# Patient Record
Sex: Female | Born: 1945
Health system: Southern US, Community
[De-identification: ages and names within clinical notes are randomized; demographics above are authoritative.]

## PROBLEM LIST (undated history)

## (undated) ENCOUNTER — Ambulatory Visit: Admission: EM

## (undated) DIAGNOSIS — B373 Candidiasis of vulva and vagina: Secondary | ICD-10-CM

## (undated) DIAGNOSIS — E538 Deficiency of other specified B group vitamins: Secondary | ICD-10-CM

## (undated) DIAGNOSIS — E0500 Thyrotoxicosis with diffuse goiter without thyrotoxic crisis or storm: Secondary | ICD-10-CM

## (undated) DIAGNOSIS — M549 Dorsalgia, unspecified: Secondary | ICD-10-CM

## (undated) DIAGNOSIS — E559 Vitamin D deficiency, unspecified: Secondary | ICD-10-CM

## (undated) DIAGNOSIS — R1013 Epigastric pain: Secondary | ICD-10-CM

## (undated) DIAGNOSIS — T7840XA Allergy, unspecified, initial encounter: Secondary | ICD-10-CM

## (undated) DIAGNOSIS — M8580 Other specified disorders of bone density and structure, unspecified site: Secondary | ICD-10-CM

## (undated) DIAGNOSIS — K219 Gastro-esophageal reflux disease without esophagitis: Secondary | ICD-10-CM

## (undated) DIAGNOSIS — B3731 Acute candidiasis of vulva and vagina: Secondary | ICD-10-CM

## (undated) DIAGNOSIS — K760 Fatty (change of) liver, not elsewhere classified: Secondary | ICD-10-CM

## (undated) DIAGNOSIS — K635 Polyp of colon: Secondary | ICD-10-CM

## (undated) DIAGNOSIS — E039 Hypothyroidism, unspecified: Secondary | ICD-10-CM

## (undated) DIAGNOSIS — E785 Hyperlipidemia, unspecified: Secondary | ICD-10-CM

## (undated) DIAGNOSIS — E0590 Thyrotoxicosis, unspecified without thyrotoxic crisis or storm: Secondary | ICD-10-CM

## (undated) HISTORY — DX: Thyrotoxicosis, unspecified without thyrotoxic crisis or storm: E05.90

## (undated) HISTORY — DX: Candidiasis of vulva and vagina: B37.3

## (undated) HISTORY — PX: COLONOSCOPY: SHX5424

## (undated) HISTORY — PX: CATARACT EXTRACTION, BILATERAL: SHX1313

## (undated) HISTORY — DX: Acute candidiasis of vulva and vagina: B37.31

## (undated) HISTORY — DX: Hyperlipidemia, unspecified: E78.5

## (undated) HISTORY — PX: OTHER SURGICAL HISTORY: SHX169

## (undated) HISTORY — DX: Allergy, unspecified, initial encounter: T78.40XA

## (undated) HISTORY — DX: Other specified disorders of bone density and structure, unspecified site: M85.80

## (undated) HISTORY — DX: Dorsalgia, unspecified: M54.9

## (undated) HISTORY — PX: EYE SURGERY: SHX253

## (undated) HISTORY — PX: REDUCTION MAMMAPLASTY: SUR839

## (undated) HISTORY — PX: BREAST SURGERY: SHX581

## (undated) HISTORY — PX: CARPAL TUNNEL RELEASE: SHX101

---

## 1981-11-23 HISTORY — PX: REDUCTION MAMMAPLASTY: SUR839

## 1998-07-19 ENCOUNTER — Other Ambulatory Visit: Admission: RE | Admit: 1998-07-19 | Discharge: 1998-07-19 | Payer: Self-pay | Admitting: Obstetrics and Gynecology

## 1999-11-10 ENCOUNTER — Other Ambulatory Visit: Admission: RE | Admit: 1999-11-10 | Discharge: 1999-11-10 | Payer: Self-pay | Admitting: Obstetrics and Gynecology

## 2000-09-09 ENCOUNTER — Encounter: Admission: RE | Admit: 2000-09-09 | Discharge: 2000-09-09 | Payer: Self-pay | Admitting: Obstetrics and Gynecology

## 2000-09-09 ENCOUNTER — Encounter: Payer: Self-pay | Admitting: Obstetrics and Gynecology

## 2000-11-30 ENCOUNTER — Other Ambulatory Visit: Admission: RE | Admit: 2000-11-30 | Discharge: 2000-11-30 | Payer: Self-pay | Admitting: Obstetrics and Gynecology

## 2001-12-02 ENCOUNTER — Other Ambulatory Visit: Admission: RE | Admit: 2001-12-02 | Discharge: 2001-12-02 | Payer: Self-pay | Admitting: Obstetrics and Gynecology

## 2003-04-11 ENCOUNTER — Encounter: Payer: Self-pay | Admitting: Obstetrics and Gynecology

## 2003-04-11 ENCOUNTER — Encounter: Admission: RE | Admit: 2003-04-11 | Discharge: 2003-04-11 | Payer: Self-pay | Admitting: Obstetrics and Gynecology

## 2005-07-02 ENCOUNTER — Encounter: Admission: RE | Admit: 2005-07-02 | Discharge: 2005-07-02 | Payer: Self-pay | Admitting: Obstetrics and Gynecology

## 2005-12-02 ENCOUNTER — Encounter
Admission: RE | Admit: 2005-12-02 | Discharge: 2005-12-02 | Payer: Self-pay | Admitting: Physical Medicine and Rehabilitation

## 2006-10-22 ENCOUNTER — Ambulatory Visit: Payer: Self-pay | Admitting: Family Medicine

## 2006-10-26 ENCOUNTER — Ambulatory Visit: Payer: Self-pay | Admitting: Family Medicine

## 2007-08-24 LAB — HM DEXA SCAN

## 2007-09-12 ENCOUNTER — Encounter: Payer: Self-pay | Admitting: Family Medicine

## 2007-09-12 ENCOUNTER — Ambulatory Visit: Payer: Self-pay | Admitting: Obstetrics and Gynecology

## 2007-11-03 ENCOUNTER — Ambulatory Visit: Payer: Self-pay | Admitting: Obstetrics and Gynecology

## 2008-07-23 ENCOUNTER — Ambulatory Visit: Payer: Self-pay | Admitting: Family Medicine

## 2008-07-23 DIAGNOSIS — M810 Age-related osteoporosis without current pathological fracture: Secondary | ICD-10-CM | POA: Insufficient documentation

## 2008-07-23 DIAGNOSIS — M8580 Other specified disorders of bone density and structure, unspecified site: Secondary | ICD-10-CM

## 2008-07-24 ENCOUNTER — Telehealth (INDEPENDENT_AMBULATORY_CARE_PROVIDER_SITE_OTHER): Payer: Self-pay | Admitting: *Deleted

## 2008-07-26 ENCOUNTER — Ambulatory Visit: Payer: Self-pay | Admitting: Family Medicine

## 2008-07-26 DIAGNOSIS — E039 Hypothyroidism, unspecified: Secondary | ICD-10-CM | POA: Insufficient documentation

## 2008-08-01 LAB — CONVERTED CEMR LAB
Free T4: 2.5 ng/dL — ABNORMAL HIGH (ref 0.6–1.6)
T3 Uptake Ratio: 49.1 % — ABNORMAL HIGH (ref 22.5–37.0)
TSH: 0.03 microintl units/mL — ABNORMAL LOW (ref 0.35–5.50)

## 2008-08-09 ENCOUNTER — Encounter: Payer: Self-pay | Admitting: Family Medicine

## 2008-08-11 ENCOUNTER — Encounter: Payer: Self-pay | Admitting: Family Medicine

## 2008-08-13 ENCOUNTER — Encounter: Payer: Self-pay | Admitting: Family Medicine

## 2008-08-16 ENCOUNTER — Encounter: Payer: Self-pay | Admitting: Family Medicine

## 2008-08-23 DIAGNOSIS — E559 Vitamin D deficiency, unspecified: Secondary | ICD-10-CM

## 2008-09-13 ENCOUNTER — Encounter: Payer: Self-pay | Admitting: Family Medicine

## 2008-09-27 ENCOUNTER — Encounter: Payer: Self-pay | Admitting: Family Medicine

## 2008-10-26 ENCOUNTER — Telehealth: Payer: Self-pay | Admitting: Family Medicine

## 2008-10-30 ENCOUNTER — Telehealth: Payer: Self-pay | Admitting: Family Medicine

## 2008-10-31 ENCOUNTER — Encounter: Payer: Self-pay | Admitting: Family Medicine

## 2008-11-08 ENCOUNTER — Encounter: Payer: Self-pay | Admitting: Family Medicine

## 2008-12-14 ENCOUNTER — Ambulatory Visit: Payer: Self-pay | Admitting: Family Medicine

## 2008-12-14 DIAGNOSIS — J309 Allergic rhinitis, unspecified: Secondary | ICD-10-CM | POA: Insufficient documentation

## 2008-12-14 LAB — CONVERTED CEMR LAB: Rapid Strep: NEGATIVE

## 2009-02-25 ENCOUNTER — Telehealth: Payer: Self-pay | Admitting: Family Medicine

## 2009-03-18 ENCOUNTER — Telehealth: Payer: Self-pay | Admitting: Family Medicine

## 2009-04-25 ENCOUNTER — Encounter: Payer: Self-pay | Admitting: Family Medicine

## 2009-07-09 ENCOUNTER — Telehealth: Payer: Self-pay | Admitting: Family Medicine

## 2009-07-25 ENCOUNTER — Encounter: Payer: Self-pay | Admitting: Family Medicine

## 2009-07-25 ENCOUNTER — Ambulatory Visit: Payer: Self-pay | Admitting: Family Medicine

## 2009-08-01 ENCOUNTER — Encounter (INDEPENDENT_AMBULATORY_CARE_PROVIDER_SITE_OTHER): Payer: Self-pay | Admitting: *Deleted

## 2009-09-18 ENCOUNTER — Ambulatory Visit: Payer: Self-pay | Admitting: Family Medicine

## 2009-09-18 ENCOUNTER — Encounter: Payer: Self-pay | Admitting: Family Medicine

## 2009-10-24 ENCOUNTER — Encounter: Payer: Self-pay | Admitting: Family Medicine

## 2009-12-10 ENCOUNTER — Telehealth: Payer: Self-pay | Admitting: Family Medicine

## 2010-03-27 ENCOUNTER — Encounter: Payer: Self-pay | Admitting: Family Medicine

## 2010-09-25 ENCOUNTER — Encounter: Payer: Self-pay | Admitting: Family Medicine

## 2010-10-07 ENCOUNTER — Telehealth: Payer: Self-pay | Admitting: Family Medicine

## 2010-11-10 ENCOUNTER — Encounter: Payer: Self-pay | Admitting: Family Medicine

## 2010-11-10 ENCOUNTER — Ambulatory Visit: Payer: Self-pay | Admitting: Family Medicine

## 2010-11-18 ENCOUNTER — Encounter: Payer: Self-pay | Admitting: Family Medicine

## 2010-11-18 ENCOUNTER — Encounter (INDEPENDENT_AMBULATORY_CARE_PROVIDER_SITE_OTHER): Payer: Self-pay | Admitting: *Deleted

## 2010-11-25 ENCOUNTER — Encounter: Payer: Self-pay | Admitting: Family Medicine

## 2010-11-25 ENCOUNTER — Telehealth (INDEPENDENT_AMBULATORY_CARE_PROVIDER_SITE_OTHER): Payer: Self-pay | Admitting: *Deleted

## 2010-11-25 ENCOUNTER — Ambulatory Visit
Admission: RE | Admit: 2010-11-25 | Discharge: 2010-11-25 | Payer: Self-pay | Source: Home / Self Care | Attending: Family Medicine | Admitting: Family Medicine

## 2010-11-27 ENCOUNTER — Other Ambulatory Visit
Admission: RE | Admit: 2010-11-27 | Discharge: 2010-11-27 | Payer: Self-pay | Source: Home / Self Care | Admitting: Family Medicine

## 2010-11-27 ENCOUNTER — Ambulatory Visit
Admission: RE | Admit: 2010-11-27 | Discharge: 2010-11-27 | Payer: Self-pay | Source: Home / Self Care | Attending: Family Medicine | Admitting: Family Medicine

## 2010-11-27 LAB — CONVERTED CEMR LAB
Albumin: 3.8 g/dL (ref 3.5–5.2)
Alkaline Phosphatase: 96 units/L (ref 39–117)
Basophils Absolute: 0 10*3/uL (ref 0.0–0.1)
CO2: 29 meq/L (ref 19–32)
Calcium: 9.3 mg/dL (ref 8.4–10.5)
Cholesterol: 199 mg/dL (ref 0–200)
Eosinophils Absolute: 0.1 10*3/uL (ref 0.0–0.7)
Glucose, Bld: 80 mg/dL (ref 70–99)
HDL: 57.9 mg/dL (ref >39.00)
Hemoglobin: 12.5 g/dL (ref 12.0–15.0)
Lymphocytes Relative: 24.4 % (ref 12.0–46.0)
Lymphs Abs: 0.9 10*3/uL (ref 0.7–4.0)
MCHC: 34.2 g/dL (ref 30.0–36.0)
MCV: 96.5 fL (ref 78.0–100.0)
Monocytes Absolute: 0.3 10*3/uL (ref 0.1–1.0)
Neutro Abs: 2.2 10*3/uL (ref 1.4–7.7)
RDW: 12.6 % (ref 11.5–14.6)
Sodium: 139 meq/L (ref 135–145)
TSH: 0.26 microintl units/mL — ABNORMAL LOW (ref 0.35–5.50)
Triglycerides: 60 mg/dL (ref 0.0–149.0)

## 2010-12-01 ENCOUNTER — Telehealth: Payer: Self-pay | Admitting: Family Medicine

## 2010-12-08 ENCOUNTER — Encounter: Payer: Self-pay | Admitting: Internal Medicine

## 2010-12-10 ENCOUNTER — Encounter: Payer: Self-pay | Admitting: Family Medicine

## 2010-12-11 ENCOUNTER — Ambulatory Visit
Admission: RE | Admit: 2010-12-11 | Discharge: 2010-12-11 | Payer: Self-pay | Source: Home / Self Care | Attending: Family Medicine | Admitting: Family Medicine

## 2010-12-11 ENCOUNTER — Encounter: Payer: Self-pay | Admitting: Family Medicine

## 2010-12-21 LAB — CONVERTED CEMR LAB
ALT: 20 units/L (ref 0–35)
AST: 22 units/L (ref 0–37)
Albumin: 3.4 g/dL — ABNORMAL LOW (ref 3.5–5.2)
Alkaline Phosphatase: 67 units/L (ref 39–117)
BUN: 14 mg/dL (ref 6–23)
Bilirubin, Direct: 0.1 mg/dL (ref 0.0–0.3)
CO2: 29 meq/L (ref 19–32)
Calcium: 9 mg/dL (ref 8.4–10.5)
Chloride: 111 meq/L (ref 96–112)
Creatinine, Ser: 0.7 mg/dL (ref 0.4–1.2)
GFR calc Af Amer: 110 mL/min
GFR calc non Af Amer: 91 mL/min
Glucose, Bld: 93 mg/dL (ref 70–99)
Phosphorus: 4.8 mg/dL — ABNORMAL HIGH (ref 2.3–4.6)
Potassium: 4.2 meq/L (ref 3.5–5.1)
Pro B Natriuretic peptide (BNP): 50 pg/mL (ref 0.0–100.0)
Sodium: 141 meq/L (ref 135–145)
TSH: 0.02 microintl units/mL — ABNORMAL LOW (ref 0.35–5.50)
Total Bilirubin: 0.6 mg/dL (ref 0.3–1.2)
Total Protein: 6.1 g/dL (ref 6.0–8.3)

## 2010-12-25 NOTE — Progress Notes (Signed)
----   Converted from flag ---- ---- 11/23/2010 4:49 PM, Colon Flattery Tower MD wrote: please check wellness and lipid and vit D for v70.0 and vit D def   ---- 11/19/2010 9:56 AM, Liane Comber CMA (AAMA) wrote: Lab orders please! Good Morning! This pt is scheduled for cpx labs Monday, which labs to draw and dx codes to use? Thanks Tasha ------------------------------

## 2010-12-25 NOTE — Progress Notes (Signed)
Summary: wants zostavax  Phone Note Call from Patient Call back at Home Phone [phone]   Caller: Patient Call For: Judith Part MD Summary of Call: Pt is asking for zostavax, she checked with her insurance company and they will pay for it.  Also, she is asking if you think she should have a colonoscopy, she has never had one.  If you want her to have one she would like to be referred to [place] Initial call taken by: Lowella Petties CMA, AAMA,  December 01, 2010 11:56 AM  Follow-up for Phone Call        colonosc is recommended for screening every 10 years after age of 69 I will do the referral  sched appt for zostavax please Follow-up by: Judith Part MD,  December 01, 2010 1:40 PM  Additional Follow-up for Phone Call Additional follow up Details #1::        .Patient notified as instructed by telephone. Pt scheduled nurse visit for Zostavax on 12/09/10 at 3pm. Pt will wait to hear from pt care coordinator about colonoscopy appt. Pt can be reached at [phone].Lewanda Rife LPN  December 01, 2010 4:36 PM   New Problems: SCREENING, COLON CANCER (ICD-V76.51)   Additional Follow-up for Phone Call Additional follow up Details #2::    Pre-Visit for Colonoscopy at [facility] set up for 01/13/2011 at 1:30 with Dr Iline Oven P.A. Records faxed. Follow-up by: Carlton Adam,  December 04, 2010 9:17 AM  New Problems: SCREENING, COLON CANCER (ICD-V76.51)

## 2010-12-25 NOTE — Assessment & Plan Note (Signed)
Summary: ZOSTAVAX PER DR TOWER/RI  Nurse Visit   Allergies: No Known Drug Allergies  Immunizations Administered:  Zostavax # 1:    Vaccine Type: Zostavax    Site: left deltoid    Mfr: Merck    Dose: 0.26ml    Route: Parker    Given by: Mervin Hack CMA (AAMA)    Exp. Date: 07/11/2011    Lot #: [id number]    VIS given: 09/04/05 given December 11, 2010.  Orders Added: 1)  Zoster (Shingles) Vaccine Live [90736] 2)  Admin 1st Vaccine [phone]

## 2010-12-25 NOTE — Miscellaneous (Signed)
Summary: Mammogram update  Clinical Lists Changes  Observations: Added new observation of MAMMO DUE: 11/2011 (11/18/2010 13:30) Added new observation of MAMMOGRAM: normal (11/10/2010 13:31)      Preventive Care Screening  Mammogram:    Date:  11/10/2010    Next Due:  11/2011    Results:  normal

## 2010-12-25 NOTE — Progress Notes (Signed)
Summary: Request Allegra D-12  Phone Note From Pharmacy Call back at [phone]   Caller: [facility] [address] #[id number]*/[place] Call For: Dr. Milinda Antis  Summary of Call: Patient is currently taking Allegra D-24 hour and would like to be changed to Allegra D-12 hour because there is a generic for this. Please call back with a new rx.  Initial call taken by: Sydell Axon LPN,  December 10, 2009 10:37 AM  Follow-up for Phone Call        px written on EMR for call in  Follow-up by: Judith Part MD,  December 10, 2009 12:21 PM  Additional Follow-up for Phone Call Additional follow up Details #1::        [facility] Additional Follow-up by: Lowella Petties CMA,  December 10, 2009 12:39 PM    New/Updated Medications: ALLEGRA-D 12 HOUR 60-120 MG XR12H-TAB (FEXOFENADINE-PSEUDOEPHEDRINE) 1 by mouth two times a day Prescriptions: ALLEGRA-D 12 HOUR 60-120 MG XR12H-TAB (FEXOFENADINE-PSEUDOEPHEDRINE) 1 by mouth two times a day  #60 x 5   Entered and Authorized by:   Judith Part MD   Signed by:   Judith Part MD on 12/10/2009   Method used:   Telephoned to ...       [facility] [address] [phone]* (retail)       [address] [place], [place]  [id number]       Ph: [phone]       Fax: [phone]   RxID:   [id number]

## 2010-12-25 NOTE — Letter (Signed)
Summary: Dr.Michael Altheimer,[facility]  Dr.Michael Altheimer,[facility]   Imported By: Beau Fanny 04/07/2010 13:36:05  _____________________________________________________________________  External Attachment:    Type:   Image     Comment:   External Document

## 2010-12-25 NOTE — Miscellaneous (Signed)
Summary: Waiver of Liability for Zostavax  Waiver of Liability for Zostavax   Imported By: Maryln Gottron 12/16/2010 13:29:24  _____________________________________________________________________  External Attachment:    Type:   Image     Comment:   External Document

## 2010-12-25 NOTE — Progress Notes (Signed)
Summary: wants order for mammogram   Phone Note Call from Patient Call back at Home Phone [phone]   Caller: Patient Call For: Judith Part MD Summary of Call: Patient is asking if she coul get an order faxed to [facility] for her yearly mammogram, she wants to maker her own appt. I also advised patient that she needed to schedule appt. since she hasn't been seen in a while. She scheduled cpx for January.  Initial call taken by: Melody Comas,  October 07, 2010 4:56 PM  Follow-up for Phone Call        will do order for mam  Follow-up by: Judith Part MD,  October 07, 2010 7:48 PM  Additional Follow-up for Phone Call Additional follow up Details #1::        Patient advised .Consuello Masse CMA    Patient also wants labs done before cpx.Consuello Masse CMA    lab appt made for physcial Additional Follow-up by: Benny Lennert CMA Duncan Dull),  October 08, 2010 8:51 AM

## 2010-12-25 NOTE — Letter (Signed)
Summary: Guidelines for maintaining your health/[employer]   Guidelines for maintaining your health/[employer]   Imported By: Maryln Gottron 12/16/2010 13:31:18  _____________________________________________________________________  External Attachment:    Type:   Image     Comment:   External Document

## 2010-12-25 NOTE — Letter (Signed)
Summary: Results Follow up Letter  [place] at [facility]  [address] [place], [place] [address]   Phone: [phone]  Fax: [phone]    12/08/2010 MRN: [id number]  Angela Bell [address] [place], [place]  [address]  Dear Ms. Mcdermott,  The following are the results of your recent test(s):  Test         Result    Pap Smear:        Normal __X___  Not Normal _____ Comments:pap smear is negative ______________________________________________________ Cholesterol: LDL(Bad cholesterol):         Your goal is less than:         HDL (Good cholesterol):       Your goal is more than: Comments:  ______________________________________________________ Mammogram:        Normal _____  Not Normal _____ Comments:  ___________________________________________________________________ Hemoccult:        Normal _____  Not normal _______ Comments:    _____________________________________________________________________ Other Tests:    We routinely do not discuss normal results over the telephone.  If you desire a copy of the results, or you have any questions about this information we can discuss them at your next office visit.   Sincerely,      Judith Part, MD

## 2010-12-25 NOTE — Letter (Signed)
Summary: Results Follow up Letter  [place] at [facility]  [address] [place], [place] [address]   Phone: [phone]  Fax: [phone]    11/18/2010 MRN: [id number]  Angela Bell [address] [place], [place]  [address]  Dear Ms. Gaymon,  The following are the results of your recent test(s):  Test         Result    Pap Smear:        Normal _____  Not Normal _____ Comments: ______________________________________________________ Cholesterol: LDL(Bad cholesterol):         Your goal is less than:         HDL (Good cholesterol):       Your goal is more than: Comments:  ______________________________________________________ Mammogram:        Normal __X___  Not Normal _____ Comments:Repeat in 1 year  ___________________________________________________________________ Hemoccult:        Normal _____  Not normal _______ Comments:    _____________________________________________________________________ Other Tests:    We routinely do not discuss normal results over the telephone.  If you desire a copy of the results, or you have any questions about this information we can discuss them at your next office visit.   Sincerely,      Sharilyn Sites for Dr. Roxy Manns

## 2010-12-25 NOTE — Assessment & Plan Note (Signed)
Summary: CPX/ALC   Vital Signs:  Patient profile:   65 year old female Height:      64.25 inches Weight:      134.75 pounds BMI:     23.03 Temp:     98.5 degrees F oral Pulse rate:   72 / minute Pulse rhythm:   regular BP sitting:   100 / 60  (left arm) Cuff size:   regular  Vitals Entered By: Lewanda Rife LPN (November 27, 2010 10:04 AM) CC: CPX LMP 1999   History of Present Illness: here for wellness exam and to disc chronic med problems  has been feeling good - nothing new at all   some stress had to put her mother in assisted living   (is 68 and health is declining)  occ that makes her stomach hurts  has good support in friends and family   wt is up 6 lb  tsh is low-- does not go back to Dr Abbey Chatters -- will f/u in feb or march   bp good   mam 12/11-normal  self exam -- no lumps or changes   pap -- was 4 years ago -- has never had any abn paps  wants to get that over with   dexa 10/10 mild osteopenia vit D is 78 she takes 5000 international units per day -- will send labs to Dr Abbey Chatters  he manages   lipids --LDL 129 good hdl  this borderline  ate more fats over holdays   needs Td  is interested in shingles vaccine    Allergies (verified): No Known Drug Allergies  Past History:  Past Surgical History: Last updated: 07/23/2008 C-S times 2  breast reduction chin implant eye sx times 3 for strabismus 02 dexa nl 10/08 dexa osteopenia TS -1.4 LS and -1.2 FN, and -2.1 radius   Family History: Last updated: 07/23/2008 Mother -- OP , swollen ankles , mild heart problem ?  father -- RA   GM osteoporosis , swollen ankles  GF with colon cancer  GM with heart trouble   Social History: Last updated: 07/23/2008 former smoker -- quit in 1983  some exercise   Past Medical History: allergich rhinitis and conjunctivitis  back pain- disk dz lumbar hyperlipidemia osteopenia  hyperthyroidism-- Grave's disease (tx with radioactive I)  allergist- Dr  Burnard Hawthorne  GYn -- Dr Skeet SimmerLorre Munroe  endo-- Altheimer  Review of Systems General:  Denies fatigue, fever, loss of appetite, and malaise. Eyes:  chronic R eye pain - surgery in the past  she will likely need ref to neuro- opthymologist in future . CV:  Denies chest pain or discomfort, palpitations, and shortness of breath with exertion. Resp:  Denies cough and shortness of breath. GI:  occ stomach pain/indigestion when really stressed  . GU:  Denies abnormal vaginal bleeding, discharge, dysuria, and urinary frequency. MS:  Denies joint pain, joint redness, and joint swelling. Derm:  Denies itching, lesion(s), poor wound healing, and rash. Neuro:  Denies numbness and tingling. Psych:  Denies anxiety and depression. Endo:  Denies cold intolerance, excessive thirst, excessive urination, and heat intolerance. Heme:  Denies abnormal bruising and bleeding.  Physical Exam  General:  Well-developed,well-nourished,in no acute distress; alert,appropriate and cooperative throughout examination Head:  normocephalic, atraumatic, and no abnormalities observed.   Eyes:  vision grossly intact, pupils equal, pupils round, and pupils reactive to light.  no conjunctival pallor, injection or icterus  Ears:  R ear normal and L ear normal.   Nose:  no nasal discharge.   Mouth:  pharynx pink and moist.   Neck:  supple with full rom and no masses or thyromegally, no JVD or carotid bruit  Chest Wall:  No deformities, masses, or tenderness noted. Breasts:  No mass, nodules, thickening, tenderness, bulging, retraction, inflamation, nipple discharge or skin changes noted.  scars from breast reduction noted  Lungs:  Normal respiratory effort, chest expands symmetrically. Lungs are clear to auscultation, no crackles or wheezes. Heart:  Normal rate and regular rhythm. S1 and S2 normal without gallop, murmur, click, rub or other extra sounds. Abdomen:  Bowel sounds positive,abdomen soft and non-tender without masses,  organomegaly or hernias noted. no renal bruits  Genitalia:  Normal introitus for age, no external lesions, no vaginal discharge, mucosa pink and moist, no vaginal or cervical lesions, no vaginal atrophy, no friaility or hemorrhage, normal uterus size and position, no adnexal masses or tenderness Msk:  No deformity or scoliosis noted of thoracic or lumbar spine.  no acute joint changes  Pulses:  R and L carotid,radial,femoral,dorsalis pedis and posterior tibial pulses are full and equal bilaterally Extremities:  No clubbing, cyanosis, edema, or deformity noted with normal full range of motion of all joints.   Neurologic:  sensation intact to light touch, gait normal, and DTRs symmetrical and normal.   Skin:  Intact without suspicious lesions or rashes Cervical Nodes:  No lymphadenopathy noted Axillary Nodes:  No palpable lymphadenopathy Inguinal Nodes:  No significant adenopathy Psych:  normal affect, talkative and pleasant    Impression & Recommendations:  Problem # 1:  HEALTH MAINTENANCE EXAM (ICD-V70.0) Assessment Comment Only reviewed health habits including diet, exercise and skin cancer prevention reviewed health maintenance list and family history reviewed wellness labs disc low sat fat diet for borderline chol  f/u with endo for tsh low  Problem # 2:  ROUTINE GYNECOLOGICAL EXAMINATION (ICD-V72.31) Assessment: Comment Only annual exam/ nl  pend pap report   Problem # 3:  UNSPECIFIED VITAMIN D DEFICIENCY (ICD-268.9) Assessment: Improved tx by Dr Althiemer level is high normal on 5000 international units daily will disc at her f/u with him  Problem # 4:  THYROID STIMULATING HORMONE, ABNORMAL (ICD-246.9) Assessment: Deteriorated will continue f/u with endo for hyperthyroidism   Problem # 5:  OSTEOPENIA (ICD-733.90) Assessment: Unchanged will be due dexa in 10/12 on ca and D last time was improved disc imp of wt bearing exercise  The following medications were removed  from the medication list:    Vitamin D 1000 Unit Tabs (Cholecalciferol) .Marland Kitchen... Take 1 tablet by mouth two times a day Her updated medication list for this problem includes:    Calcium Carbonate-vitamin D 600-400 Mg-unit Tabs (Calcium carbonate-vitamin d) .Marland Kitchen... Take 2  tablets  by mouth once a day    Vitamin D3 5000 Unit Caps (Cholecalciferol) .Marland Kitchen... Take 1 capsule by mouth once a day  Complete Medication List: 1)  Synthroid 100 Mcg Tabs (Levothyroxine sodium) .... Take 1 tablet by mouth once a day 2)  Calcium Carbonate-vitamin D 600-400 Mg-unit Tabs (Calcium carbonate-vitamin d) .... Take 2  tablets  by mouth once a day 3)  Multivitamins Tabs (Multiple vitamin) .... Take 1 tablet by mouth once a day 4)  Allegra-d 12 Hour 60-120 Mg Xr12h-tab (Fexofenadine-pseudoephedrine) .Marland Kitchen.. 1 by mouth two times a day as needed 5)  Vitamin D3 5000 Unit Caps (Cholecalciferol) .... Take 1 capsule by mouth once a day  Other Orders: TD Toxoids IM 7 YR + ([address]109) Admin 1st Vaccine (60454)  Patient Instructions: 1)  you can raise your HDL (good cholesterol) by increasing exercise and eating omega 3 fatty acid supplement like fish oil or flax seed oil over the counter 2)  you can lower LDL (bad cholesterol) by limiting saturated fats in diet like red meat, fried foods, egg yolks, fatty breakfast meats, high fat dairy products and shellfish  3)  if you are interested in a shingles vaccine - call your ins co and talk about coverage- then call us to schedule  4)  tetnus shot today  5)  for occasional stomach pain - zantac otc is ok  6)  if stomach pain becomes persistant - let me know , we will need to treat differently   Orders Added: 1)  TD Toxoids IM 7 YR + [90714] 2)  Admin 1st Vaccine [90471] 3)  Est. Patient 40-64 years [16109]   Immunization History:  Influenza Immunization History:    Influenza:  fluvax 3+ (07/16/2010)  Immunizations Administered:  Tetanus Vaccine:    Vaccine Type: Td    Site:  left deltoid    Mfr: Sanofi Pasteur    Dose: 0.5 ml    Route: IM    Given by: Lewanda Rife LPN    Exp. Date: 03/11/2012    Lot #: [id number]    VIS given: 10/10/08 version given November 27, 2010.   Immunization History:  Influenza Immunization History:    Influenza:  Fluvax 3+ (07/16/2010)  Immunizations Administered:  Tetanus Vaccine:    Vaccine Type: Td    Site: left deltoid    Mfr: Sanofi Pasteur    Dose: 0.5 ml    Route: IM    Given by: Lewanda Rife LPN    Exp. Date: 03/11/2012    Lot #: [id number]    VIS given: 10/10/08 version given November 27, 2010.  Current Allergies (reviewed today): No known allergies

## 2010-12-25 NOTE — Letter (Signed)
Summary: [facility]  [facility]   Imported By: Lanelle Bal 10/08/2010 13:43:28  _____________________________________________________________________  External Attachment:    Type:   Image     Comment:   External Document

## 2011-05-04 ENCOUNTER — Ambulatory Visit: Payer: Self-pay | Admitting: Unknown Physician Specialty

## 2011-05-05 LAB — PATHOLOGY REPORT

## 2011-05-09 ENCOUNTER — Telehealth: Payer: Self-pay

## 2011-05-09 NOTE — Telephone Encounter (Signed)
Opened for quick abstraction re colonoscopy. Sent to be scanned.

## 2011-05-18 ENCOUNTER — Telehealth: Payer: Self-pay | Admitting: *Deleted

## 2011-05-18 NOTE — Telephone Encounter (Signed)
Pt is asking if she can have low dose hctz to take for swelling in her ankles.  She says she has this problem in the hot summer months.  She has been taking her friend's pills and they help.  [facility]ses [facility] [address]

## 2011-05-19 NOTE — Telephone Encounter (Signed)
We need to disc it and make sure bp is ok  Also make sure nothing is causing her swelling but the heat  In addition - hctz or any diuretic  Can make electrolytes like sodium and K to be abnormal- so it needs to be watched closely F/u when able please Drink lots of water and watch salt in diet

## 2011-05-19 NOTE — Telephone Encounter (Signed)
Patient notified as instructed by telephone. Pt could not come in until wk of 06-09-11. Pt scheduled appt  With Dr Milinda Antis 06/10/11 at 2pm. Pt will call back if can come sooner.

## 2011-05-30 ENCOUNTER — Encounter: Payer: Self-pay | Admitting: Family Medicine

## 2011-06-10 ENCOUNTER — Ambulatory Visit: Payer: Self-pay | Admitting: Family Medicine

## 2011-12-22 ENCOUNTER — Telehealth: Payer: Self-pay | Admitting: Family Medicine

## 2011-12-22 DIAGNOSIS — Z1231 Encounter for screening mammogram for malignant neoplasm of breast: Secondary | ICD-10-CM | POA: Insufficient documentation

## 2011-12-22 DIAGNOSIS — M899 Disorder of bone, unspecified: Secondary | ICD-10-CM

## 2011-12-22 NOTE — Telephone Encounter (Signed)
Order done

## 2011-12-22 NOTE — Telephone Encounter (Signed)
Pt called request order for Mammogram and Bone Density  to [facility]... Call back # [phone]

## 2012-02-09 ENCOUNTER — Ambulatory Visit: Payer: Self-pay | Admitting: Family Medicine

## 2012-02-11 ENCOUNTER — Encounter: Payer: Self-pay | Admitting: Family Medicine

## 2012-02-15 ENCOUNTER — Encounter: Payer: Self-pay | Admitting: *Deleted

## 2012-02-16 ENCOUNTER — Encounter: Payer: Self-pay | Admitting: Family Medicine

## 2012-02-18 ENCOUNTER — Encounter: Payer: Self-pay | Admitting: Family Medicine

## 2012-04-18 ENCOUNTER — Telehealth: Payer: Self-pay | Admitting: Family Medicine

## 2012-04-18 DIAGNOSIS — Z0000 Encounter for general adult medical examination without abnormal findings: Secondary | ICD-10-CM | POA: Insufficient documentation

## 2012-04-18 DIAGNOSIS — M949 Disorder of cartilage, unspecified: Secondary | ICD-10-CM

## 2012-04-18 DIAGNOSIS — E559 Vitamin D deficiency, unspecified: Secondary | ICD-10-CM

## 2012-04-18 NOTE — Telephone Encounter (Signed)
Message copied by Judy Pimple on Mon Apr 18, 2012  9:50 PM ------      Message from: Alvina Chou      Created: Wed Apr 13, 2012 10:32 AM      Regarding: labs for Tues 5-28       Patient is scheduled for CPX labs, please order future labs, Thanks , Camelia Eng

## 2012-04-19 ENCOUNTER — Other Ambulatory Visit (INDEPENDENT_AMBULATORY_CARE_PROVIDER_SITE_OTHER): Payer: No Typology Code available for payment source

## 2012-04-19 DIAGNOSIS — E559 Vitamin D deficiency, unspecified: Secondary | ICD-10-CM

## 2012-04-19 DIAGNOSIS — M899 Disorder of bone, unspecified: Secondary | ICD-10-CM

## 2012-04-19 DIAGNOSIS — Z0000 Encounter for general adult medical examination without abnormal findings: Secondary | ICD-10-CM

## 2012-04-19 LAB — COMPREHENSIVE METABOLIC PANEL
AST: 19 U/L (ref 0–37)
Albumin: 4 g/dL (ref 3.5–5.2)
Alkaline Phosphatase: 98 U/L (ref 39–117)
Potassium: 3.6 mEq/L (ref 3.5–5.1)
Sodium: 141 mEq/L (ref 135–145)
Total Protein: 6.9 g/dL (ref 6.0–8.3)

## 2012-04-19 LAB — LIPID PANEL
Cholesterol: 201 mg/dL — ABNORMAL HIGH (ref 0–200)
HDL: 65.9 mg/dL
Total CHOL/HDL Ratio: 3
Triglycerides: 70 mg/dL (ref 0.0–149.0)
VLDL: 14 mg/dL (ref 0.0–40.0)

## 2012-04-19 LAB — CBC WITH DIFFERENTIAL/PLATELET
Eosinophils Absolute: 0.1 10*3/uL (ref 0.0–0.7)
MCHC: 33.1 g/dL (ref 30.0–36.0)
MCV: 96.1 fl (ref 78.0–100.0)
Monocytes Absolute: 0.4 10*3/uL (ref 0.1–1.0)
Neutrophils Relative %: 64.2 % (ref 43.0–77.0)
Platelets: 211 10*3/uL (ref 150.0–400.0)
RDW: 13.6 % (ref 11.5–14.6)

## 2012-04-19 LAB — TSH: TSH: 0.92 u[IU]/mL (ref 0.35–5.50)

## 2012-04-25 ENCOUNTER — Ambulatory Visit (INDEPENDENT_AMBULATORY_CARE_PROVIDER_SITE_OTHER): Payer: No Typology Code available for payment source | Admitting: Family Medicine

## 2012-04-25 ENCOUNTER — Encounter: Payer: Self-pay | Admitting: Family Medicine

## 2012-04-25 VITALS — BP 104/64 | HR 68 | Temp 98.2°F | Ht 64.25 in | Wt 134.8 lb

## 2012-04-25 DIAGNOSIS — Z0000 Encounter for general adult medical examination without abnormal findings: Secondary | ICD-10-CM

## 2012-04-25 DIAGNOSIS — E559 Vitamin D deficiency, unspecified: Secondary | ICD-10-CM

## 2012-04-25 DIAGNOSIS — M899 Disorder of bone, unspecified: Secondary | ICD-10-CM

## 2012-04-25 DIAGNOSIS — R1013 Epigastric pain: Secondary | ICD-10-CM | POA: Insufficient documentation

## 2012-04-25 DIAGNOSIS — K3189 Other diseases of stomach and duodenum: Secondary | ICD-10-CM

## 2012-04-25 DIAGNOSIS — M949 Disorder of cartilage, unspecified: Secondary | ICD-10-CM

## 2012-04-25 MED ORDER — FEXOFENADINE-PSEUDOEPHED ER 60-120 MG PO TB12: 1.0000 | ORAL_TABLET | Freq: Two times a day (BID) | ORAL | Status: DC | PRN

## 2012-04-25 MED ORDER — TERCONAZOLE 0.4 % VA CREA: 1.0000 | TOPICAL_CREAM | Freq: Every day | VAGINAL | Status: DC

## 2012-04-25 NOTE — Patient Instructions (Signed)
If you have indigestion/ stomach upset more than 2 times per week we will need to start you on daily therapy  For now - if you have symptoms once in a while - use prilosec and that is fine  Watch what you eat  Also if prilosec does not work- we may need to check your gallbladder so follow up   Keep up the good work with diet and exercise

## 2012-04-25 NOTE — Assessment & Plan Note (Signed)
Doing very well with diet and exercise / ca and D D level nl  No new fractures  Next dexa due in a year

## 2012-04-25 NOTE — Assessment & Plan Note (Signed)
Better now - on current suppl in 49s

## 2012-04-25 NOTE — Assessment & Plan Note (Signed)
Pt had episode after eating some heavy foods / beans  Better after 14 d of prilosec (though it gives her diarrhea)  Will use prn but alert me if symptoms happen more than 2 d per week or if they worsen Will continue to watch her diet

## 2012-04-25 NOTE — Progress Notes (Signed)
Subjective:    Patient ID: Angela Bell, female    DOB: 1946/08/19, 66 y.o.   MRN: [id number]  HPI Here for health maintenance exam and to review chronic medical problems   Is feeling good overall   However - more indigestion for a while - got some prilosec over the counter for 14 days- got a lot better (but it gives her diarrhea) Eaten too much gassy foods/ heavy foods Changed diet and feels much better Just keeps it on hand in case  No heartburn today No abd pain or tenderness or n/v  Lab Results  Component Value Date   WBC 4.5 04/19/2012   HGB 12.3 04/19/2012   HCT 37.3 04/19/2012   MCV 96.1 04/19/2012   PLT 211.0 04/19/2012    Lab Results  Component Value Date   ALT 14 04/19/2012   AST 19 04/19/2012   ALKPHOS 98 04/19/2012   BILITOT 0.9 04/19/2012     Chemistry      Component Value Date/Time   NA 141 04/19/2012 0831   K 3.6 04/19/2012 0831   CL 106 04/19/2012 0831   CO2 28 04/19/2012 0831   BUN 14 04/19/2012 0831   CREATININE 0.8 04/19/2012 0831      Component Value Date/Time   CALCIUM 9.3 04/19/2012 0831   ALKPHOS 98 04/19/2012 0831   AST 19 04/19/2012 0831   ALT 14 04/19/2012 0831   BILITOT 0.9 04/19/2012 0831        Wt is stable with bmi of 22 bp is good 104/64  Osteopenia dexa 3/13 Ca and D-- calcium causes her to bloat and gives her leg cramps  Exercise- regularly  Good D level of 55- hx of def in past - takes a lot more D   Lipids- diet controlled  Lab Results  Component Value Date   CHOL 201* 04/19/2012   CHOL 199 11/25/2010   Lab Results  Component Value Date   HDL 65.90 04/19/2012   HDL 13.08 11/25/2010   Lab Results  Component Value Date   LDLCALC 129* 11/25/2010   Lab Results  Component Value Date   TRIG 70.0 04/19/2012   TRIG 60.0 11/25/2010   Lab Results  Component Value Date   CHOLHDL 3 04/19/2012   CHOLHDL 3 11/25/2010   Lab Results  Component Value Date   LDLDIRECT 128.6 04/19/2012    Still at goal for risk factors    Flu shot fall -was  normal   Pap 1/12 nl  Has never had abn paps  No gyn problems at all - other than random yeast infection (terazol works- Chief of Staff does not) Feeling fine today No new partner No bleeding    mammo 3/13 nl  Self exam - no lumps or problems   colonosc polyp 6/12- said 5 year follow up   Patient Active Problem List  Diagnoses  . UNSPECIFIED VITAMIN D DEFICIENCY  . ALLERGIC RHINITIS  . OSTEOPENIA  . HYPERTHYROIDISM  . Other screening mammogram  . Routine general medical examination at a health care facility  . Dyspepsia   Past Medical History  Diagnosis Date  . Allergy     allergic rhinitis  . Back pain     disc disease lumbar  . Hyperlipidemia   . Osteopenia   . Hyperthyroidism     Grave's disease (tx with radioactive I)  . Yeast vaginitis     recurrent- keeps terazol on hand / otc does not work   Past Surgical History  Procedure Date  .  Cesarean section     x 2  . Breast surgery     breast reduction  . Chin implant   . Eye surgery     x3 for strabismus   History  Substance Use Topics  . Smoking status: Former Smoker    Quit date: 11/23/1981  . Smokeless tobacco: Never Used  . Alcohol Use: Yes     occassionally   Family History  Problem Relation Age of Onset  . Osteoporosis Mother   . Heart disease Mother     mild heart problem  . Arthritis Father     RA   No Known Allergies Current Outpatient Prescriptions on File Prior to Visit  Medication Sig Dispense Refill  . Calcium Carbonate-Vit D-Min 600-400 MG-UNIT TABS Take 2 tablets by mouth daily.        . Cholecalciferol (VITAMIN D3) 5000 UNITS CAPS Take 1 capsule by mouth daily.        Marland Kitchen levothyroxine (SYNTHROID) 100 MCG tablet Take 100 mcg by mouth daily.        . Multiple Vitamin (MULTIVITAMIN) tablet Take 2 times a week      . omeprazole (PRILOSEC OTC) 20 MG tablet Take 20 mg by mouth as needed.            Review of Systems Review of Systems  Constitutional: Negative for fever, appetite  change, fatigue and unexpected weight change.  Eyes: Negative for pain and visual disturbance.  Respiratory: Negative for cough and shortness of breath.   Cardiovascular: Negative for cp or palpitations    Gastrointestinal: Negative for nausea, diarrhea and constipation. pos for indigestion/ epigastric bloating, neg for blood in stool or dark stool Genitourinary: Negative for urgency and frequency.  Skin: Negative for pallor or rash   Neurological: Negative for weakness, light-headedness, numbness and headaches.  Hematological: Negative for adenopathy. Does not bruise/bleed easily.  Psychiatric/Behavioral: Negative for dysphoric mood. The patient is not nervous/anxious.          Objective:   Physical Exam  Constitutional: She appears well-developed and well-nourished. No distress.  HENT:  Head: Normocephalic and atraumatic.  Right Ear: External ear normal.  Left Ear: External ear normal.  Nose: Nose normal.  Mouth/Throat: Oropharynx is clear and moist.  Eyes: Conjunctivae and EOM are normal. Pupils are equal, round, and reactive to light. No scleral icterus.  Neck: Normal range of motion. Neck supple. No JVD present. No thyromegaly present.  Cardiovascular: Normal rate, regular rhythm, normal heart sounds and intact distal pulses.  Exam reveals no gallop.   Pulmonary/Chest: Effort normal and breath sounds normal. No respiratory distress. She has no wheezes.  Abdominal: Soft. Bowel sounds are normal. She exhibits no distension and no mass. There is no tenderness.  Genitourinary: No breast swelling, tenderness, discharge or bleeding.       Breast exam: No mass, nodules, thickening, tenderness, bulging, retraction, inflamation, nipple discharge or skin changes noted.  No axillary or clavicular LA.  Chaperoned exam.    Musculoskeletal: Normal range of motion. She exhibits no edema and no tenderness.  Lymphadenopathy:    She has no cervical adenopathy.  Neurological: She is alert. She has  normal reflexes. No cranial nerve deficit. She exhibits normal muscle tone. Coordination normal.  Skin: Skin is warm and dry. No rash noted. No erythema. No pallor.  Psychiatric: She has a normal mood and affect.          Assessment & Plan:

## 2012-04-25 NOTE — Assessment & Plan Note (Signed)
Reviewed health habits including diet and exercise and skin cancer prevention Also reviewed health mt list, fam hx and immunizations  Rev wellness labs in detail Stable chol- goal LDL under 130/ good HDL

## 2013-05-03 ENCOUNTER — Other Ambulatory Visit: Payer: Self-pay | Admitting: Family Medicine

## 2013-05-03 MED ORDER — FEXOFENADINE-PSEUDOEPHED ER 60-120 MG PO TB12: 1.0000 | ORAL_TABLET | Freq: Two times a day (BID) | ORAL | Status: DC | PRN

## 2013-08-21 ENCOUNTER — Telehealth: Payer: Self-pay | Admitting: Family Medicine

## 2013-08-21 DIAGNOSIS — Z0000 Encounter for general adult medical examination without abnormal findings: Secondary | ICD-10-CM | POA: Insufficient documentation

## 2013-08-21 DIAGNOSIS — M899 Disorder of bone, unspecified: Secondary | ICD-10-CM

## 2013-08-21 DIAGNOSIS — E559 Vitamin D deficiency, unspecified: Secondary | ICD-10-CM

## 2013-08-21 DIAGNOSIS — E0590 Thyrotoxicosis, unspecified without thyrotoxic crisis or storm: Secondary | ICD-10-CM

## 2013-08-21 NOTE — Telephone Encounter (Signed)
Message copied by Judy Pimple on Mon Aug 21, 2013  9:43 PM ------      Message from: Angela Bell      Created: Tue Aug 15, 2013  1:58 PM      Regarding: Cpx labs Tues 9/30       Please order  future cpx labs for pt's upcoming lab appt.      Thanks      Tasha       ------

## 2013-08-22 ENCOUNTER — Other Ambulatory Visit (INDEPENDENT_AMBULATORY_CARE_PROVIDER_SITE_OTHER): Payer: Medicare Other

## 2013-08-22 DIAGNOSIS — M899 Disorder of bone, unspecified: Secondary | ICD-10-CM

## 2013-08-22 DIAGNOSIS — Z0000 Encounter for general adult medical examination without abnormal findings: Secondary | ICD-10-CM

## 2013-08-22 DIAGNOSIS — E559 Vitamin D deficiency, unspecified: Secondary | ICD-10-CM

## 2013-08-22 DIAGNOSIS — E0590 Thyrotoxicosis, unspecified without thyrotoxic crisis or storm: Secondary | ICD-10-CM

## 2013-08-22 LAB — CBC WITH DIFFERENTIAL/PLATELET
Basophils Absolute: 0 10*3/uL (ref 0.0–0.1)
Basophils Relative: 0.5 % (ref 0.0–3.0)
Eosinophils Relative: 3 % (ref 0.0–5.0)
HCT: 37.4 % (ref 36.0–46.0)
Lymphs Abs: 0.9 10*3/uL (ref 0.7–4.0)
Monocytes Relative: 8 % (ref 3.0–12.0)
Neutrophils Relative %: 66.3 % (ref 43.0–77.0)
Platelets: 229 10*3/uL (ref 150.0–400.0)
RBC: 3.97 Mil/uL (ref 3.87–5.11)
RDW: 13.6 % (ref 11.5–14.6)
WBC: 4.3 10*3/uL — ABNORMAL LOW (ref 4.5–10.5)

## 2013-08-22 LAB — COMPREHENSIVE METABOLIC PANEL
ALT: 17 U/L (ref 0–35)
Albumin: 3.9 g/dL (ref 3.5–5.2)
Alkaline Phosphatase: 89 U/L (ref 39–117)
CO2: 28 mEq/L (ref 19–32)
GFR: 82.43 mL/min (ref >60.00)
Glucose, Bld: 88 mg/dL (ref 70–99)
Potassium: 4.1 mEq/L (ref 3.5–5.1)
Sodium: 139 mEq/L (ref 135–145)
Total Protein: 7 g/dL (ref 6.0–8.3)

## 2013-08-22 LAB — LIPID PANEL
Cholesterol: 223 mg/dL — ABNORMAL HIGH (ref 0–200)
Total CHOL/HDL Ratio: 3
VLDL: 12.8 mg/dL (ref 0.0–40.0)

## 2013-08-22 LAB — TSH: TSH: 0.93 u[IU]/mL (ref 0.35–5.50)

## 2013-08-22 LAB — LDL CHOLESTEROL, DIRECT: Direct LDL: 146.3 mg/dL

## 2013-08-23 LAB — VITAMIN D 25 HYDROXY (VIT D DEFICIENCY, FRACTURES): Vit D, 25-Hydroxy: 66 ng/mL (ref 30–89)

## 2013-08-29 ENCOUNTER — Ambulatory Visit (INDEPENDENT_AMBULATORY_CARE_PROVIDER_SITE_OTHER): Payer: Medicare Other | Admitting: Family Medicine

## 2013-08-29 ENCOUNTER — Encounter: Payer: Self-pay | Admitting: Family Medicine

## 2013-08-29 VITALS — HR 73 | Temp 98.9°F | Ht 64.0 in | Wt 135.8 lb

## 2013-08-29 DIAGNOSIS — M899 Disorder of bone, unspecified: Secondary | ICD-10-CM

## 2013-08-29 DIAGNOSIS — E0590 Thyrotoxicosis, unspecified without thyrotoxic crisis or storm: Secondary | ICD-10-CM

## 2013-08-29 DIAGNOSIS — E559 Vitamin D deficiency, unspecified: Secondary | ICD-10-CM

## 2013-08-29 DIAGNOSIS — Z23 Encounter for immunization: Secondary | ICD-10-CM

## 2013-08-29 DIAGNOSIS — Z0000 Encounter for general adult medical examination without abnormal findings: Secondary | ICD-10-CM

## 2013-08-29 NOTE — Patient Instructions (Addendum)
Don't forget to make your screening mammogram appointment  Pneumonia vaccine today  Flu vaccine today Avoid red meat/ fried foods/ egg yolks/ fatty breakfast meats/ butter, cheese and high fat dairy/ and shellfish  (try to eat egg whites instead of the whole egg) Keep exercising

## 2013-08-29 NOTE — Progress Notes (Signed)
Subjective:    Patient ID: Angela Bell, female    DOB: [birth year], 67 y.o.   MRN: [id number]  HPI I have personally reviewed the Medicare Annual Wellness questionnaire and have noted 1. The patient's medical and social history 2. Their use of alcohol, tobacco or illicit drugs 3. Their current medications and supplements 4. The patient's functional ability including ADL's, fall risks, home safety risks and hearing or visual             impairment. 5. Diet and physical activities 6. Evidence for depression or mood disorders  The patients weight, height, BMI have been recorded in the chart and visual acuity is per eye clinic.  I have made referrals, counseling and provided education to the patient based review of the above and I have provided the pt with a written personalized care plan for preventive services.  See scanned forms.  Routine anticipatory guidance given to patient.  See health maintenance. Flu- had that today  Shingles 1/12 vaccine  PNA 11/07 - will get that today  Tetanus vaccine 1/12 Colonoscopy 6/12 - 10 year recall  Breast cancer screening mammogram- is overdue (was 3/13)  Self exam -no lumps  Advance directive- does not have a living will yet  Cognitive function addressed- see scanned forms- and if abnormal then additional documentation follows. - no major problems   PMH and SH reviewed  Meds, vitals, and allergies reviewed.   ROS: See HPI.  Otherwise negative.    Wt is up 1 lb  Osteopenia  dexa 3/13 D level is 66- low in the past - this is good  Is exercising regularly   tsh- nl range   Lab Results  Component Value Date   CHOL 223* 08/22/2013   CHOL 201* 04/19/2012   CHOL 199 11/25/2010   Lab Results  Component Value Date   HDL 65.20 08/22/2013   HDL 40.98 04/19/2012   HDL 11.91 11/25/2010   Lab Results  Component Value Date   LDLCALC 129* 11/25/2010   Lab Results  Component Value Date   TRIG 64.0 08/22/2013   TRIG 70.0 04/19/2012   TRIG 60.0  11/25/2010   Lab Results  Component Value Date   CHOLHDL 3 08/22/2013   CHOLHDL 3 04/19/2012   CHOLHDL 3 11/25/2010   Lab Results  Component Value Date   LDLDIRECT 146.3 08/22/2013   LDLDIRECT 128.6 04/19/2012    She has been eating more eggs and fatty breakfast food    Patient Active Problem List   Diagnosis Date Noted  . Encounter for Medicare annual wellness exam 08/21/2013  . Dyspepsia 04/25/2012  . Routine general medical examination at a health care facility 04/18/2012  . Other screening mammogram 12/22/2011  . ALLERGIC RHINITIS 12/14/2008  . UNSPECIFIED VITAMIN D DEFICIENCY 08/23/2008  . HYPERTHYROIDISM 07/26/2008  . OSTEOPENIA 07/23/2008   Past Medical History  Diagnosis Date  . Allergy     allergic rhinitis  . Back pain     disc disease lumbar  . Hyperlipidemia   . Osteopenia   . Hyperthyroidism     Grave's disease (tx with radioactive I)  . Yeast vaginitis     recurrent- keeps terazol on hand / otc does not work   Past Surgical History  Procedure Laterality Date  . Cesarean section      x 2  . Breast surgery      breast reduction  . Chin implant    . Eye surgery      x3  for strabismus   History  Substance Use Topics  . Smoking status: Former Smoker    Quit date: 11/23/1981  . Smokeless tobacco: Never Used  . Alcohol Use: Yes     Comment: occassionally   Family History  Problem Relation Age of Onset  . Osteoporosis Mother   . Heart disease Mother     mild heart problem  . Arthritis Father     RA   No Known Allergies Current Outpatient Prescriptions on File Prior to Visit  Medication Sig Dispense Refill  . Calcium Carbonate-Vit D-Min 600-400 MG-UNIT TABS Take 2 tablets by mouth daily.        . Cholecalciferol (VITAMIN D3) 5000 UNITS CAPS Take 1 capsule by mouth daily.        . fexofenadine-pseudoephedrine (ALLEGRA-D 12 HOUR) 60-120 MG per tablet Take 1 tablet by mouth 2 (two) times daily as needed.  60 tablet  3  . levothyroxine (SYNTHROID) 100  MCG tablet Take 100 mcg by mouth daily.        . Multiple Vitamin (MULTIVITAMIN) tablet Take 2 times a week       No current facility-administered medications on file prior to visit.    Review of Systems Review of Systems  Constitutional: Negative for fever, appetite change, fatigue and unexpected weight change.  Eyes: Negative for pain and visual disturbance.  Respiratory: Negative for cough and shortness of breath.   Cardiovascular: Negative for cp or palpitations    Gastrointestinal: Negative for nausea, diarrhea and constipation.  Genitourinary: Negative for urgency and frequency.  Skin: Negative for pallor or rash   Neurological: Negative for weakness, light-headedness, numbness and headaches.  Hematological: Negative for adenopathy. Does not bruise/bleed easily.  Psychiatric/Behavioral: Negative for dysphoric mood. The patient is not nervous/anxious.         Objective:   Physical Exam  Constitutional: She appears well-developed and well-nourished. No distress.  HENT:  Head: Normocephalic and atraumatic.  Right Ear: External ear normal.  Left Ear: External ear normal.  Nose: Nose normal.  Mouth/Throat: Oropharynx is clear and moist.  Eyes: Conjunctivae and EOM are normal. Pupils are equal, round, and reactive to light. Right eye exhibits no discharge. Left eye exhibits no discharge. No scleral icterus.  Neck: Normal range of motion. Neck supple. No JVD present. Carotid bruit is not present. No thyromegaly present.  Cardiovascular: Normal rate, regular rhythm, normal heart sounds and intact distal pulses.  Exam reveals no gallop.   Pulmonary/Chest: Effort normal and breath sounds normal. No respiratory distress. She has no wheezes. She exhibits no tenderness.  Abdominal: Soft. Bowel sounds are normal. She exhibits no distension, no abdominal bruit and no mass. There is no tenderness.  Genitourinary: No breast swelling, tenderness, discharge or bleeding.  Breast exam: No mass,  nodules, thickening, tenderness, bulging, retraction, inflamation, nipple discharge or skin changes noted.  No axillary or clavicular LA.  Chaperoned exam.    Musculoskeletal: She exhibits no edema and no tenderness.  Lymphadenopathy:    She has no cervical adenopathy.  Neurological: She is alert. She has normal reflexes. No cranial nerve deficit. She exhibits normal muscle tone. Coordination normal.  Skin: Skin is warm and dry. No rash noted. No erythema. No pallor.  Psychiatric: She has a normal mood and affect.          Assessment & Plan:

## 2013-08-30 NOTE — Assessment & Plan Note (Signed)
Rev last dexa Rev ca and D and exercise Rev safety and fall prev

## 2013-08-30 NOTE — Assessment & Plan Note (Signed)
Reviewed health habits including diet and exercise and skin cancer prevention Also reviewed health mt list, fam hx and immunizations  See HPI Will work on living will imms today

## 2013-08-30 NOTE — Assessment & Plan Note (Signed)
D level is tx on current dose Disc imp to overall and bone health

## 2013-08-30 NOTE — Assessment & Plan Note (Signed)
tsh stable  No change in exam or clinical picture No change in dose

## 2013-09-05 ENCOUNTER — Encounter: Payer: Self-pay | Admitting: Family Medicine

## 2013-09-05 ENCOUNTER — Ambulatory Visit: Payer: Self-pay | Admitting: Family Medicine

## 2013-09-06 ENCOUNTER — Encounter: Payer: Self-pay | Admitting: *Deleted

## 2014-02-06 ENCOUNTER — Other Ambulatory Visit: Payer: Self-pay | Admitting: *Deleted

## 2014-02-06 MED ORDER — TERCONAZOLE 0.4 % VA CREA: 1.0000 | TOPICAL_CREAM | Freq: Every day | VAGINAL | Status: DC

## 2014-02-06 NOTE — Telephone Encounter (Signed)
Please refill times one  

## 2014-02-06 NOTE — Telephone Encounter (Signed)
Last filled 04/25/12, ok to refill?

## 2014-02-06 NOTE — Telephone Encounter (Signed)
done

## 2014-09-11 ENCOUNTER — Other Ambulatory Visit (INDEPENDENT_AMBULATORY_CARE_PROVIDER_SITE_OTHER): Payer: Medicare Other

## 2014-09-11 ENCOUNTER — Telehealth (INDEPENDENT_AMBULATORY_CARE_PROVIDER_SITE_OTHER): Payer: Medicare Other | Admitting: Family Medicine

## 2014-09-11 DIAGNOSIS — Z0000 Encounter for general adult medical examination without abnormal findings: Secondary | ICD-10-CM

## 2014-09-11 DIAGNOSIS — E559 Vitamin D deficiency, unspecified: Secondary | ICD-10-CM

## 2014-09-11 DIAGNOSIS — M8580 Other specified disorders of bone density and structure, unspecified site: Secondary | ICD-10-CM

## 2014-09-11 DIAGNOSIS — E0590 Thyrotoxicosis, unspecified without thyrotoxic crisis or storm: Secondary | ICD-10-CM

## 2014-09-11 LAB — CBC WITH DIFFERENTIAL/PLATELET
BASOS PCT: 0.7 % (ref 0.0–3.0)
Basophils Absolute: 0 10*3/uL (ref 0.0–0.1)
Eosinophils Absolute: 0.1 10*3/uL (ref 0.0–0.7)
Eosinophils Relative: 4 % (ref 0.0–5.0)
HCT: 39.1 % (ref 36.0–46.0)
HEMOGLOBIN: 12.8 g/dL (ref 12.0–15.0)
LYMPHS PCT: 31.2 % (ref 12.0–46.0)
Lymphs Abs: 1 10*3/uL (ref 0.7–4.0)
MCHC: 32.6 g/dL (ref 30.0–36.0)
MCV: 95.7 fl (ref 78.0–100.0)
MONOS PCT: 9.1 % (ref 3.0–12.0)
Monocytes Absolute: 0.3 10*3/uL (ref 0.1–1.0)
NEUTROS ABS: 1.8 10*3/uL (ref 1.4–7.7)
Neutrophils Relative %: 55 % (ref 43.0–77.0)
Platelets: 228 10*3/uL (ref 150.0–400.0)
RBC: 4.09 Mil/uL (ref 3.87–5.11)
RDW: 13.2 % (ref 11.5–15.5)
WBC: 3.3 10*3/uL — ABNORMAL LOW (ref 4.0–10.5)

## 2014-09-11 LAB — COMPREHENSIVE METABOLIC PANEL
ALT: 12 U/L (ref 0–35)
AST: 15 U/L (ref 0–37)
Albumin: 3.4 g/dL — ABNORMAL LOW (ref 3.5–5.2)
Alkaline Phosphatase: 88 U/L (ref 39–117)
BUN: 11 mg/dL (ref 6–23)
CALCIUM: 9.1 mg/dL (ref 8.4–10.5)
CHLORIDE: 107 meq/L (ref 96–112)
CO2: 23 mEq/L (ref 19–32)
CREATININE: 0.8 mg/dL (ref 0.4–1.2)
GFR: 72.08 mL/min (ref >60.00)
Glucose, Bld: 82 mg/dL (ref 70–99)
Potassium: 4.1 mEq/L (ref 3.5–5.1)
Sodium: 140 mEq/L (ref 135–145)
Total Bilirubin: 1 mg/dL (ref 0.2–1.2)
Total Protein: 7 g/dL (ref 6.0–8.3)

## 2014-09-11 LAB — TSH: TSH: 0.4 u[IU]/mL (ref 0.35–4.50)

## 2014-09-11 LAB — LIPID PANEL
CHOL/HDL RATIO: 3
Cholesterol: 206 mg/dL — ABNORMAL HIGH (ref 0–200)
HDL: 60.2 mg/dL (ref >39.00)
LDL Cholesterol: 130 mg/dL — ABNORMAL HIGH (ref 0–99)
NonHDL: 145.8
TRIGLYCERIDES: 78 mg/dL (ref 0.0–149.0)
VLDL: 15.6 mg/dL (ref 0.0–40.0)

## 2014-09-11 LAB — VITAMIN D 25 HYDROXY (VIT D DEFICIENCY, FRACTURES): VITD: 66.02 ng/mL (ref 30.00–100.00)

## 2014-09-11 NOTE — Telephone Encounter (Signed)
Message copied by Abner Greenspan on Tue Sep 11, 2014 11:23 AM ------      Message from: Ellamae Sia      Created: Tue Sep 11, 2014  8:43 AM      Regarding: lab orders asap       Patient is scheduled for CPX labs, please order future labs, Thanks , Angela Bell       ------

## 2014-09-14 ENCOUNTER — Ambulatory Visit (INDEPENDENT_AMBULATORY_CARE_PROVIDER_SITE_OTHER): Payer: Medicare Other | Admitting: Family Medicine

## 2014-09-14 ENCOUNTER — Encounter: Payer: Self-pay | Admitting: Family Medicine

## 2014-09-14 VITALS — BP 122/68 | HR 70 | Temp 98.3°F | Ht 64.0 in | Wt 133.0 lb

## 2014-09-14 DIAGNOSIS — E559 Vitamin D deficiency, unspecified: Secondary | ICD-10-CM

## 2014-09-14 DIAGNOSIS — M8580 Other specified disorders of bone density and structure, unspecified site: Secondary | ICD-10-CM

## 2014-09-14 DIAGNOSIS — Z0000 Encounter for general adult medical examination without abnormal findings: Secondary | ICD-10-CM

## 2014-09-14 DIAGNOSIS — E0590 Thyrotoxicosis, unspecified without thyrotoxic crisis or storm: Secondary | ICD-10-CM

## 2014-09-14 DIAGNOSIS — Z23 Encounter for immunization: Secondary | ICD-10-CM

## 2014-09-14 MED ORDER — TERCONAZOLE 0.4 % VA CREA: 1.0000 | TOPICAL_CREAM | Freq: Every day | VAGINAL | Status: AC

## 2014-09-14 NOTE — Assessment & Plan Note (Signed)
D level is good No falls or fx  dexa ordered (2 y f/u)

## 2014-09-14 NOTE — Progress Notes (Signed)
Pre visit review using our clinic review tool, if applicable. No additional management support is needed unless otherwise documented below in the visit note. 

## 2014-09-14 NOTE — Assessment & Plan Note (Addendum)
Reviewed health habits including diet and exercise and skin cancer prevention Reviewed appropriate screening tests for age  Also reviewed health mt list, fam hx and immunization status , as well as social and family history    See HPI Labs rev Flu and prevnar vaccines today She will schedule own mammogram Plan on gyn exam next year  She will work on an advanced directive also

## 2014-09-14 NOTE — Patient Instructions (Signed)
Get your mammogram as planned - you can schedule that  Flu vaccine today  prevnar vaccine today  Work on an advanced directive - I gave you the packet to look at  Stop at check out to schedule bone density test

## 2014-09-14 NOTE — Assessment & Plan Note (Signed)
Hypothyroidism  Pt has no clinical changes No change in energy level/ hair or skin/ edema and no tremor Lab Results  Component Value Date   TSH 0.40 09/11/2014

## 2014-09-14 NOTE — Assessment & Plan Note (Addendum)
Ok with current supplementation  Vitamin D level is therapeutic with current supplementation Disc importance of this to bone and overall health

## 2014-09-14 NOTE — Progress Notes (Signed)
Subjective:    Patient ID: Angela Bell, female    DOB: July 19, 1946, 68 y.o.   MRN: [id number]  HPI I have personally reviewed the Medicare Annual Wellness questionnaire and have noted 1. The patient's medical and social history 2. Their use of alcohol, tobacco or illicit drugs 3. Their current medications and supplements 4. The patient's functional ability including ADL's, fall risks, home safety risks and hearing or visual             impairment. 5. Diet and physical activities 6. Evidence for depression or mood disorders  The patients weight, height, BMI have been recorded in the chart and visual acuity is per eye clinic.  I have made referrals, counseling and provided education to the patient based review of the above and I have provided the pt with a written personalized care plan for preventive services.  Feels good No new complaints   See scanned forms.  Routine anticipatory guidance given to patient.  See health maintenance. Colon cancer screening 6/12 - with 5 year recall  Breast cancer screening 10/14- does not have it scheduled yet , will make that appointment  Self breast exam-no lumps or changes  Flu vaccine- wants that today Tetanus vaccine 1/12  Pneumovax 10/14 and she wants to have the prevnar vaccine  Zoster vaccine 1/12   Advance directive- she does not have one - is interested to do that -given a packet  Cognitive function addressed- see scanned forms- and if abnormal then additional documentation follows. Doing well - she keeps an eye on this    PMH and SH reviewed  Meds, vitals, and allergies reviewed.   ROS: See HPI.  Otherwise negative.      osteopenia  dexa 3/13- will schedule that today  No falls or fx  D level is 66  Hx of hyperthyroidism S/p RI tx Lab Results  Component Value Date   TSH 0.40 09/11/2014     Wt is down 2 lb with bmi of 22 She eats healthy and exercises   Lipids Lab Results  Component Value Date   CHOL 206*  09/11/2014   CHOL 223* 08/22/2013   CHOL 201* 04/19/2012   Lab Results  Component Value Date   HDL 60.20 09/11/2014   HDL 65.20 08/22/2013   HDL 65.90 04/19/2012   Lab Results  Component Value Date   LDLCALC 130* 09/11/2014   LDLCALC 129* 11/25/2010   Lab Results  Component Value Date   TRIG 78.0 09/11/2014   TRIG 64.0 08/22/2013   TRIG 70.0 04/19/2012   Lab Results  Component Value Date   CHOLHDL 3 09/11/2014   CHOLHDL 3 08/22/2013   CHOLHDL 3 04/19/2012   Lab Results  Component Value Date   LDLDIRECT 146.3 08/22/2013   LDLDIRECT 128.6 04/19/2012   pretty good overall  LDL 130 but HDL is high    Patient Active Problem List   Diagnosis Date Noted  . Encounter for Medicare annual wellness exam 08/21/2013  . Dyspepsia 04/25/2012  . Other screening mammogram 12/22/2011  . ALLERGIC RHINITIS 12/14/2008  . Vitamin D deficiency 08/23/2008  . Hypothyroid 07/26/2008  . Osteopenia 07/23/2008   Past Medical History  Diagnosis Date  . Allergy     allergic rhinitis  . Back pain     disc disease lumbar  . Hyperlipidemia   . Osteopenia   . Hyperthyroidism     Grave's disease (tx with radioactive I)  . Yeast vaginitis     recurrent- keeps  terazol on hand / otc does not work   Past Surgical History  Procedure Laterality Date  . Cesarean section      x 2  . Breast surgery      breast reduction  . Chin implant    . Eye surgery      x3 for strabismus   History  Substance Use Topics  . Smoking status: Former Smoker    Quit date: 11/23/1981  . Smokeless tobacco: Never Used  . Alcohol Use: Yes     Comment: occassionally   Family History  Problem Relation Age of Onset  . Osteoporosis Mother   . Heart disease Mother     mild heart problem  . Arthritis Father     RA   No Known Allergies Current Outpatient Prescriptions on File Prior to Visit  Medication Sig Dispense Refill  . Calcium Carbonate-Vit D-Min 600-400 MG-UNIT TABS Take 2 tablets by mouth daily.        .  Cholecalciferol (VITAMIN D3) 5000 UNITS CAPS Take 1 capsule by mouth daily.        . fexofenadine-pseudoephedrine (ALLEGRA-D 12 HOUR) 60-120 MG per tablet Take 1 tablet by mouth 2 (two) times daily as needed.  60 tablet  3  . levothyroxine (SYNTHROID) 100 MCG tablet Take 100 mcg by mouth daily.        . Multiple Vitamin (MULTIVITAMIN) tablet Take 2 times a week       No current facility-administered medications on file prior to visit.    Review of Systems Review of Systems  Constitutional: Negative for fever, appetite change, fatigue and unexpected weight change.  Eyes: Negative for pain and visual disturbance.  Respiratory: Negative for cough and shortness of breath.   Cardiovascular: Negative for cp or palpitations    Gastrointestinal: Negative for nausea, diarrhea and constipation.  Genitourinary: Negative for urgency and frequency.  Skin: Negative for pallor or rash   Neurological: Negative for weakness, light-headedness, numbness and headaches.  Hematological: Negative for adenopathy. Does not bruise/bleed easily.  Psychiatric/Behavioral: Negative for dysphoric mood. The patient is not nervous/anxious.         Objective:   Physical Exam  Constitutional: She appears well-developed and well-nourished. No distress.  HENT:  Head: Normocephalic and atraumatic.  Right Ear: External ear normal.  Left Ear: External ear normal.  Mouth/Throat: Oropharynx is clear and moist.  Eyes: Conjunctivae and EOM are normal. Pupils are equal, round, and reactive to light. No scleral icterus.  Neck: Normal range of motion. Neck supple. No JVD present. Carotid bruit is not present. No thyromegaly present.  Cardiovascular: Normal rate, regular rhythm, normal heart sounds and intact distal pulses.  Exam reveals no gallop.   Pulmonary/Chest: Effort normal and breath sounds normal. No respiratory distress. She has no wheezes. She exhibits no tenderness.  Abdominal: Soft. Bowel sounds are normal. She  exhibits no distension, no abdominal bruit and no mass. There is no tenderness.  Genitourinary: No breast swelling, tenderness, discharge or bleeding.  Breast exam: No mass, nodules, thickening, tenderness, bulging, retraction, inflamation, nipple discharge or skin changes noted.  No axillary or clavicular LA.      Musculoskeletal: Normal range of motion. She exhibits no edema and no tenderness.  Lymphadenopathy:    She has no cervical adenopathy.  Neurological: She is alert. She has normal reflexes. No cranial nerve deficit. She exhibits normal muscle tone. Coordination normal.  Skin: Skin is warm and dry. No rash noted. No erythema. No pallor.  Psychiatric: She  has a normal mood and affect.          Assessment & Plan:   Problem List Items Addressed This Visit     Endocrine   Hypothyroid      Hypothyroidism  Pt has no clinical changes No change in energy level/ hair or skin/ edema and no tremor Lab Results  Component Value Date   TSH 0.40 09/11/2014          Musculoskeletal and Integument   Osteopenia     D level is good No falls or fx  dexa ordered (2 y f/u)    Relevant Orders      DG Bone Density     Other   Vitamin D deficiency     Ok with current supplementation  Vitamin D level is therapeutic with current supplementation Disc importance of this to bone and overall health     Encounter for Medicare annual wellness exam - Primary     Reviewed health habits including diet and exercise and skin cancer prevention Reviewed appropriate screening tests for age  Also reviewed health mt list, fam hx and immunization status , as well as social and family history    See HPI Labs rev Flu and prevnar vaccines today She will schedule own mammogram Plan on gyn exam next year  She will work on an advanced directive also      Other Visit Diagnoses   Need for prophylactic vaccination and inoculation against influenza        Relevant Orders       Flu Vaccine QUAD 36+  mos PF IM (Fluarix Quad PF) (Completed)    Need for vaccination with 13-polyvalent pneumococcal conjugate vaccine        Relevant Orders       Pneumococcal conjugate vaccine 13-valent (Completed)

## 2014-10-11 ENCOUNTER — Ambulatory Visit: Payer: Self-pay | Admitting: Family Medicine

## 2014-10-11 LAB — HM DEXA SCAN

## 2014-10-12 ENCOUNTER — Encounter: Payer: Self-pay | Admitting: Family Medicine

## 2014-10-15 ENCOUNTER — Encounter: Payer: Self-pay | Admitting: *Deleted

## 2014-10-16 ENCOUNTER — Encounter: Payer: Self-pay | Admitting: Family Medicine

## 2014-10-16 ENCOUNTER — Encounter: Payer: Self-pay | Admitting: *Deleted

## 2015-11-20 ENCOUNTER — Telehealth: Payer: Self-pay | Admitting: Family Medicine

## 2015-11-20 DIAGNOSIS — E559 Vitamin D deficiency, unspecified: Secondary | ICD-10-CM

## 2015-11-20 DIAGNOSIS — Z0000 Encounter for general adult medical examination without abnormal findings: Secondary | ICD-10-CM

## 2015-11-20 DIAGNOSIS — E039 Hypothyroidism, unspecified: Secondary | ICD-10-CM

## 2015-11-20 NOTE — Telephone Encounter (Signed)
-----   Message from Ellamae Sia sent at 11/07/2015 12:28 PM EST ----- Regarding: Lab orders for Thursday, 12.29.16 Patient is scheduled for CPX labs, please order future labs, Thanks , Karna Christmas

## 2015-11-21 ENCOUNTER — Other Ambulatory Visit (INDEPENDENT_AMBULATORY_CARE_PROVIDER_SITE_OTHER): Payer: Medicare Other

## 2015-11-21 DIAGNOSIS — E039 Hypothyroidism, unspecified: Secondary | ICD-10-CM | POA: Diagnosis not present

## 2015-11-21 DIAGNOSIS — E559 Vitamin D deficiency, unspecified: Secondary | ICD-10-CM | POA: Diagnosis not present

## 2015-11-21 DIAGNOSIS — Z0000 Encounter for general adult medical examination without abnormal findings: Secondary | ICD-10-CM

## 2015-11-21 LAB — LIPID PANEL
Cholesterol: 188 mg/dL (ref 0–200)
HDL: 75.4 mg/dL (ref >39.00)
LDL Cholesterol: 101 mg/dL — ABNORMAL HIGH (ref 0–99)
NONHDL: 112.14
TRIGLYCERIDES: 54 mg/dL (ref 0.0–149.0)
Total CHOL/HDL Ratio: 2
VLDL: 10.8 mg/dL (ref 0.0–40.0)

## 2015-11-21 LAB — CBC WITH DIFFERENTIAL/PLATELET
BASOS ABS: 0 10*3/uL (ref 0.0–0.1)
Basophils Relative: 0.2 % (ref 0.0–3.0)
EOS ABS: 0.2 10*3/uL (ref 0.0–0.7)
Eosinophils Relative: 3.3 % (ref 0.0–5.0)
HCT: 38.2 % (ref 36.0–46.0)
HEMOGLOBIN: 12.6 g/dL (ref 12.0–15.0)
LYMPHS PCT: 12 % (ref 12.0–46.0)
Lymphs Abs: 0.8 10*3/uL (ref 0.7–4.0)
MCHC: 33.1 g/dL (ref 30.0–36.0)
MCV: 95.6 fl (ref 78.0–100.0)
Monocytes Absolute: 0.4 10*3/uL (ref 0.1–1.0)
Monocytes Relative: 6.1 % (ref 3.0–12.0)
NEUTROS ABS: 5.3 10*3/uL (ref 1.4–7.7)
Neutrophils Relative %: 78.4 % — ABNORMAL HIGH (ref 43.0–77.0)
PLATELETS: 241 10*3/uL (ref 150.0–400.0)
RBC: 4 Mil/uL (ref 3.87–5.11)
RDW: 13.6 % (ref 11.5–15.5)
WBC: 6.8 10*3/uL (ref 4.0–10.5)

## 2015-11-21 LAB — COMPREHENSIVE METABOLIC PANEL
ALT: 14 U/L (ref 0–35)
AST: 14 U/L (ref 0–37)
Albumin: 4 g/dL (ref 3.5–5.2)
Alkaline Phosphatase: 107 U/L (ref 39–117)
BUN: 10 mg/dL (ref 6–23)
CALCIUM: 9.3 mg/dL (ref 8.4–10.5)
CO2: 26 meq/L (ref 19–32)
Chloride: 105 mEq/L (ref 96–112)
Creatinine, Ser: 0.71 mg/dL (ref 0.40–1.20)
GFR: 87.2 mL/min (ref >60.00)
Glucose, Bld: 90 mg/dL (ref 70–99)
Potassium: 3.8 mEq/L (ref 3.5–5.1)
Sodium: 140 mEq/L (ref 135–145)
Total Bilirubin: 0.9 mg/dL (ref 0.2–1.2)
Total Protein: 7 g/dL (ref 6.0–8.3)

## 2015-11-21 LAB — TSH: TSH: 0.82 u[IU]/mL (ref 0.35–4.50)

## 2015-11-21 LAB — VITAMIN D 25 HYDROXY (VIT D DEFICIENCY, FRACTURES): VITD: 56.94 ng/mL (ref 30.00–100.00)

## 2015-11-29 ENCOUNTER — Encounter: Payer: Self-pay | Admitting: Family Medicine

## 2015-11-29 ENCOUNTER — Ambulatory Visit (INDEPENDENT_AMBULATORY_CARE_PROVIDER_SITE_OTHER): Payer: PPO | Admitting: Family Medicine

## 2015-11-29 VITALS — BP 110/72 | HR 77 | Temp 98.3°F | Ht 64.5 in | Wt 138.5 lb

## 2015-11-29 DIAGNOSIS — Z0000 Encounter for general adult medical examination without abnormal findings: Secondary | ICD-10-CM | POA: Diagnosis not present

## 2015-11-29 DIAGNOSIS — E559 Vitamin D deficiency, unspecified: Secondary | ICD-10-CM

## 2015-11-29 DIAGNOSIS — E039 Hypothyroidism, unspecified: Secondary | ICD-10-CM

## 2015-11-29 DIAGNOSIS — M8580 Other specified disorders of bone density and structure, unspecified site: Secondary | ICD-10-CM

## 2015-11-29 NOTE — Progress Notes (Signed)
Subjective:    Patient ID: Angela Bell, female    DOB: 11/05/1946, 70 y.o.   MRN: [id number]  HPI Here for annual medicare wellness visit as well as chronic/acute medical problems and also preventative examination  I have personally reviewed the Medicare Annual Wellness questionnaire and have noted 1. The patient's medical and social history 2. Their use of alcohol, tobacco or illicit drugs 3. Their current medications and supplements 4. The patient's functional ability including ADL's, fall risks, home safety risks and hearing or visual             impairment. 5. Diet and physical activities 6. Evidence for depression or mood disorders  The patients weight, height, BMI have been recorded in the chart and visual acuity is per eye clinic.  I have made referrals, counseling and provided education to the patient based review of the above and I have provided the pt with a written personalized care plan for preventive services. Reviewed and updated provider list, see scanned forms.  Feels good/has had a good year   See scanned forms.  Routine anticipatory guidance given to patient.  See health maintenance. Colon cancer screening 6/12 -5 year recall  Breast cancer screening 11/15 - is due  Self breast exam-no breast lumps  Flu vaccine had in dec Tetanus vaccine 1/12 Pneumovax 10/15-has had both/complete  Zoster vaccine 1/12  dexa 11/15 - osteopenia ,  Had a fall this oct- on L side, she has pain over her hip (she has appt with ortho)- does not think she fractured it (exercise bothers it) , she takes her ca and D , D level is good at 12 Advance directive has a living will and power of attorney Cognitive function addressed- see scanned forms- and if abnormal then additional documentation follows. No major concerns - occ forgets a name , she is very social   PMH and George reviewed  Meds, vitals, and allergies reviewed.   ROS: See HPI.  Otherwise negative.    Hypothyroidism  Pt  has no clinical changes No change in energy level/ hair or skin/ edema and no tremor Lab Results  Component Value Date   TSH 0.82 11/21/2015   she sees endocrinologist  Hx of graves in the past   Cholesterol Lab Results  Component Value Date   CHOL 188 11/21/2015   CHOL 206* 09/11/2014   CHOL 223* 08/22/2013   Lab Results  Component Value Date   HDL 75.40 11/21/2015   HDL 60.20 09/11/2014   HDL 65.20 08/22/2013   Lab Results  Component Value Date   LDLCALC 101* 11/21/2015   LDLCALC 130* 09/11/2014   LDLCALC 129* 11/25/2010   Lab Results  Component Value Date   TRIG 54.0 11/21/2015   TRIG 78.0 09/11/2014   TRIG 64.0 08/22/2013   Lab Results  Component Value Date   CHOLHDL 2 11/21/2015   CHOLHDL 3 09/11/2014   CHOLHDL 3 08/22/2013   Lab Results  Component Value Date   LDLDIRECT 146.3 08/22/2013   LDLDIRECT 128.6 04/19/2012   She stopped fast food and biscuits  More fruits and vegetables Thrilled with the improvement   Results for orders placed or performed in visit on 11/21/15  CBC with Differential/Platelet  Result Value Ref Range   WBC 6.8 4.0 - 10.5 K/uL   RBC 4.00 3.87 - 5.11 Mil/uL   Hemoglobin 12.6 12.0 - 15.0 g/dL   HCT 38.2 36.0 - 46.0 %   MCV 95.6 78.0 - 100.0 fl  MCHC 33.1 30.0 - 36.0 g/dL   RDW 13.6 11.5 - 15.5 %   Platelets 241.0 150.0 - 400.0 K/uL   Neutrophils Relative % 78.4 (H) 43.0 - 77.0 %   Lymphocytes Relative 12.0 12.0 - 46.0 %   Monocytes Relative 6.1 3.0 - 12.0 %   Eosinophils Relative 3.3 0.0 - 5.0 %   Basophils Relative 0.2 0.0 - 3.0 %   Neutro Abs 5.3 1.4 - 7.7 K/uL   Lymphs Abs 0.8 0.7 - 4.0 K/uL   Monocytes Absolute 0.4 0.1 - 1.0 K/uL   Eosinophils Absolute 0.2 0.0 - 0.7 K/uL   Basophils Absolute 0.0 0.0 - 0.1 K/uL  Comprehensive metabolic panel  Result Value Ref Range   Sodium 140 135 - 145 mEq/L   Potassium 3.8 3.5 - 5.1 mEq/L   Chloride 105 96 - 112 mEq/L   CO2 26 19 - 32 mEq/L   Glucose, Bld 90 70 - 99 mg/dL    BUN 10 6 - 23 mg/dL   Creatinine, Ser 0.71 0.40 - 1.20 mg/dL   Total Bilirubin 0.9 0.2 - 1.2 mg/dL   Alkaline Phosphatase 107 39 - 117 U/L   AST 14 0 - 37 U/L   ALT 14 0 - 35 U/L   Total Protein 7.0 6.0 - 8.3 g/dL   Albumin 4.0 3.5 - 5.2 g/dL   Calcium 9.3 8.4 - 10.5 mg/dL   GFR 87.20 >60.00 mL/min  Lipid panel  Result Value Ref Range   Cholesterol 188 0 - 200 mg/dL   Triglycerides 54.0 0.0 - 149.0 mg/dL   HDL 75.40 >39.00 mg/dL   VLDL 10.8 0.0 - 40.0 mg/dL   LDL Cholesterol 101 (H) 0 - 99 mg/dL   Total CHOL/HDL Ratio 2    NonHDL 112.14   TSH  Result Value Ref Range   TSH 0.82 0.35 - 4.50 uIU/mL  VITAMIN D 25 Hydroxy (Vit-D Deficiency, Fractures)  Result Value Ref Range   VITD 56.94 30.00 - 100.00 ng/mL         Patient Active Problem List   Diagnosis Date Noted  . Routine general medical examination at a health care facility 11/20/2015  . Encounter for Medicare annual wellness exam 08/21/2013  . Dyspepsia 04/25/2012  . Other screening mammogram 12/22/2011  . ALLERGIC RHINITIS 12/14/2008  . Vitamin D deficiency 08/23/2008  . Hypothyroid 07/26/2008  . Osteopenia 07/23/2008   Past Medical History  Diagnosis Date  . Allergy     allergic rhinitis  . Back pain     disc disease lumbar  . Hyperlipidemia   . Osteopenia   . Hyperthyroidism     Grave's disease (tx with radioactive I)  . Yeast vaginitis     recurrent- keeps terazol on hand / otc does not work   Past Surgical History  Procedure Laterality Date  . Cesarean section      x 2  . Breast surgery      breast reduction  . Chin implant    . Eye surgery      x3 for strabismus   Social History  Substance Use Topics  . Smoking status: Former Smoker    Quit date: 11/23/1981  . Smokeless tobacco: Never Used  . Alcohol Use: Yes     Comment: occassionally   Family History  Problem Relation Age of Onset  . Osteoporosis Mother   . Heart disease Mother     mild heart problem  . Arthritis Father     RA  No Known Allergies Current Outpatient Prescriptions on File Prior to Visit  Medication Sig Dispense Refill  . Calcium Carbonate-Vit D-Min 600-400 MG-UNIT TABS Take 2 tablets by mouth daily.      . Cholecalciferol (VITAMIN D3) 5000 UNITS CAPS Take 1 capsule by mouth daily.      . fexofenadine-pseudoephedrine (ALLEGRA-D 12 HOUR) 60-120 MG per tablet Take 1 tablet by mouth 2 (two) times daily as needed. 60 tablet 3  . levothyroxine (SYNTHROID) 100 MCG tablet Take 100 mcg by mouth daily.      . Multiple Vitamin (MULTIVITAMIN) tablet Take 2 times a week     No current facility-administered medications on file prior to visit.    Review of Systems Review of Systems  Constitutional: Negative for fever, appetite change, fatigue and unexpected weight change.  Eyes: Negative for pain and visual disturbance.  Respiratory: Negative for cough and shortness of breath.   Cardiovascular: Negative for cp or palpitations    Gastrointestinal: Negative for nausea, diarrhea and constipation.  Genitourinary: Negative for urgency and frequency.  Skin: Negative for pallor or rash   MSK pos for hip pain after fall/ neg for mobility problems  Neurological: Negative for weakness, light-headedness, numbness and headaches.  Hematological: Negative for adenopathy. Does not bruise/bleed easily.  Psychiatric/Behavioral: Negative for dysphoric mood. The patient is not nervous/anxious.         Objective:   Physical Exam  Constitutional: She appears well-developed and well-nourished. No distress.  Well appearing   HENT:  Head: Normocephalic and atraumatic.  Right Ear: External ear normal.  Left Ear: External ear normal.  Mouth/Throat: Oropharynx is clear and moist.  Bilateral cerumen impaction-worse on L side   Eyes: Conjunctivae and EOM are normal. Pupils are equal, round, and reactive to light. No scleral icterus.  Neck: Normal range of motion. Neck supple. No JVD present. Carotid bruit is not present. No  thyromegaly present.  Cardiovascular: Normal rate, regular rhythm, normal heart sounds and intact distal pulses.  Exam reveals no gallop.   Pulmonary/Chest: Effort normal and breath sounds normal. No respiratory distress. She has no wheezes. She exhibits no tenderness.  Abdominal: Soft. Bowel sounds are normal. She exhibits no distension, no abdominal bruit and no mass. There is no tenderness.  Genitourinary: No breast swelling, tenderness, discharge or bleeding.  Musculoskeletal: Normal range of motion. She exhibits no edema or tenderness.  No kyphosis   Lymphadenopathy:    She has no cervical adenopathy.  Neurological: She is alert. She has normal reflexes. No cranial nerve deficit. She exhibits normal muscle tone. Coordination normal.  Skin: Skin is warm and dry. No rash noted. No erythema. No pallor.  Psychiatric: She has a normal mood and affect.          Assessment & Plan:   Problem List Items Addressed This Visit      Endocrine   Hypothyroid - Primary    Lab Results  Component Value Date   TSH 0.82 11/21/2015   No changes needed No clinical changes         Musculoskeletal and Integument   Osteopenia    Osteopenia 11/15 On ca and D  One fall/ no fractures  Disc need for calcium/ vitamin D/ wt bearing exercise and bone density test every 2 y to monitor Disc safety/ fracture risk in detail          Other   Encounter for Medicare annual wellness exam    Reviewed health habits including diet and exercise and skin  cancer prevention Reviewed appropriate screening tests for age  Also reviewed health mt list, fam hx and immunization status , as well as social and family history   See HPI Labs reviewed I'm glad you are doing well  Take care of yourself Cholesterol is great  Don't forget to schedule your mammogram  Stay active       Routine general medical examination at a health care facility    Reviewed health habits including diet and exercise and skin cancer  prevention Reviewed appropriate screening tests for age  Also reviewed health mt list, fam hx and immunization status , as well as social and family history   See HPI Labs reviewed I'm glad you are doing well  Take care of yourself Cholesterol is great  Don't forget to schedule your mammogram  Stay active       Vitamin D deficiency    Vitamin D level is therapeutic with current supplementation Disc importance of this to bone and overall health

## 2015-11-29 NOTE — Progress Notes (Signed)
Pre visit review using our clinic review tool, if applicable. No additional management support is needed unless otherwise documented below in the visit note. 

## 2015-11-29 NOTE — Patient Instructions (Signed)
I'm glad you are doing well  Take care of yourself Cholesterol is great  Don't forget to schedule your mammogram  Stay active    For wax in ears get a solution like debrox - to use to loosen wax in left year

## 2015-12-01 NOTE — Assessment & Plan Note (Signed)
Reviewed health habits including diet and exercise and skin cancer prevention Reviewed appropriate screening tests for age  Also reviewed health mt list, fam hx and immunization status , as well as social and family history   See HPI Labs reviewed I'm glad you are doing well  Take care of yourself Cholesterol is great  Don't forget to schedule your mammogram  Stay active

## 2015-12-01 NOTE — Assessment & Plan Note (Signed)
Osteopenia 11/15 On ca and D  One fall/ no fractures  Disc need for calcium/ vitamin D/ wt bearing exercise and bone density test every 2 y to monitor Disc safety/ fracture risk in detail

## 2015-12-01 NOTE — Assessment & Plan Note (Signed)
Vitamin D level is therapeutic with current supplementation Disc importance of this to bone and overall health  

## 2015-12-01 NOTE — Assessment & Plan Note (Signed)
Lab Results  Component Value Date   TSH 0.82 11/21/2015   No changes needed No clinical changes

## 2015-12-13 ENCOUNTER — Encounter: Payer: Self-pay | Admitting: Family Medicine

## 2015-12-13 ENCOUNTER — Ambulatory Visit (INDEPENDENT_AMBULATORY_CARE_PROVIDER_SITE_OTHER): Payer: PPO | Admitting: Family Medicine

## 2015-12-13 VITALS — BP 106/64 | HR 73 | Temp 98.7°F | Ht 64.5 in | Wt 138.2 lb

## 2015-12-13 DIAGNOSIS — H6122 Impacted cerumen, left ear: Secondary | ICD-10-CM

## 2015-12-13 DIAGNOSIS — H6690 Otitis media, unspecified, unspecified ear: Secondary | ICD-10-CM | POA: Insufficient documentation

## 2015-12-13 DIAGNOSIS — H6502 Acute serous otitis media, left ear: Secondary | ICD-10-CM | POA: Diagnosis not present

## 2015-12-13 DIAGNOSIS — H6120 Impacted cerumen, unspecified ear: Secondary | ICD-10-CM | POA: Insufficient documentation

## 2015-12-13 MED ORDER — AMOXICILLIN 500 MG PO CAPS: 500.0000 mg | ORAL_CAPSULE | Freq: Three times a day (TID) | ORAL | Status: DC

## 2015-12-13 NOTE — Progress Notes (Signed)
Subjective:    Patient ID: Angela Bell, female    DOB: Oct 07, 1946, 70 y.o.   MRN: [id number]  HPI Here with L ear fullness and pain   It is ringing and very bothersome /uncomfortable Cannot hear out of it well   Used some drops  - over the counter for wax  Made it worse   Patient Active Problem List   Diagnosis Date Noted  . Routine general medical examination at a health care facility 11/20/2015  . Encounter for Medicare annual wellness exam 08/21/2013  . Dyspepsia 04/25/2012  . Other screening mammogram 12/22/2011  . ALLERGIC RHINITIS 12/14/2008  . Vitamin D deficiency 08/23/2008  . Hypothyroid 07/26/2008  . Osteopenia 07/23/2008   Past Medical History  Diagnosis Date  . Allergy     allergic rhinitis  . Back pain     disc disease lumbar  . Hyperlipidemia   . Osteopenia   . Hyperthyroidism     Grave's disease (tx with radioactive I)  . Yeast vaginitis     recurrent- keeps terazol on hand / otc does not work   Past Surgical History  Procedure Laterality Date  . Cesarean section      x 2  . Breast surgery      breast reduction  . Chin implant    . Eye surgery      x3 for strabismus   Social History  Substance Use Topics  . Smoking status: Former Smoker    Quit date: 11/23/1981  . Smokeless tobacco: Never Used  . Alcohol Use: 0.0 oz/week    0 Standard drinks or equivalent per week     Comment: occassionally   Family History  Problem Relation Age of Onset  . Osteoporosis Mother   . Heart disease Mother     mild heart problem  . Arthritis Father     RA   No Known Allergies Current Outpatient Prescriptions on File Prior to Visit  Medication Sig Dispense Refill  . Calcium Carbonate-Vit D-Min 600-400 MG-UNIT TABS Take 2 tablets by mouth daily.      . Cholecalciferol (VITAMIN D3) 5000 UNITS CAPS Take 1 capsule by mouth daily.      . fexofenadine-pseudoephedrine (ALLEGRA-D 12 HOUR) 60-120 MG per tablet Take 1 tablet by mouth 2 (two) times daily as  needed. 60 tablet 3  . levothyroxine (SYNTHROID) 100 MCG tablet Take 100 mcg by mouth daily.      . Multiple Vitamin (MULTIVITAMIN) tablet Take 2 times a week     No current facility-administered medications on file prior to visit.      Review of Systems Review of Systems  Constitutional: Negative for fever, appetite change, and unexpected weight change.pos for malaise yesterday ENT pos for nasal congestion and L ear pain and pressure/neg for ST  Eyes: Negative for pain and visual disturbance.  Respiratory: Negative for cough and shortness of breath.   Cardiovascular: Negative for cp or palpitations    Gastrointestinal: Negative for nausea, diarrhea and constipation.  Genitourinary: Negative for urgency and frequency.  Skin: Negative for pallor or rash   Neurological: Negative for weakness, light-headedness, numbness and headaches.  Hematological: Negative for adenopathy. Does not bruise/bleed easily.  Psychiatric/Behavioral: Negative for dysphoric mood. The patient is not nervous/anxious.         Objective:   Physical Exam  Constitutional: She appears well-developed and well-nourished. No distress.  Well appearing   HENT:  Head: Normocephalic and atraumatic.  Right Ear: External ear normal.  Mouth/Throat: Oropharynx is clear and moist.  Nares are boggy  R TM clear  L canal obsc by cerumen S/p irrigation- clear and TM is erythematous and dull  Hearing improved however    Eyes: Conjunctivae and EOM are normal. Pupils are equal, round, and reactive to light. Right eye exhibits no discharge. Left eye exhibits no discharge. No scleral icterus.  Neck: Normal range of motion. Neck supple.  Cardiovascular: Normal rate and regular rhythm.   Pulmonary/Chest: Effort normal and breath sounds normal. No respiratory distress. She has no wheezes.  Lymphadenopathy:    She has no cervical adenopathy.  Neurological: She is alert. No cranial nerve deficit.  Skin: Skin is warm and dry. No  rash noted. No erythema.  Psychiatric: She has a normal mood and affect.          Assessment & Plan:   Problem List Items Addressed This Visit      Nervous and Auditory   Cerumen impaction - Primary    Resolved with simple ear irrigation  Relief of symptoms  Did have a dull and erythematous TM behind obst-will tx for OM also   Update if no further improvement       Otitis media    L TM is erythematous and dull behind cerumen imp that was removed Cover with amoxicillin  Disc symptomatic care - see instructions on AVS  Update if not starting to improve in a week or if worsening        Relevant Medications   amoxicillin (AMOXIL) 500 MG capsule

## 2015-12-13 NOTE — Patient Instructions (Signed)
You had a wax occlusion in your left ear that is now resolved  Hearing should continue to improve as it dries out  You also have a red eardrum- I suspect a mild ear infection Take the amoxicillin as directed and finish it all   Acetaminophen or ibuprofen will help pain  Update if not starting to improve in a week or if worsening

## 2015-12-13 NOTE — Assessment & Plan Note (Signed)
L TM is erythematous and dull behind cerumen imp that was removed Cover with amoxicillin  Disc symptomatic care - see instructions on AVS  Update if not starting to improve in a week or if worsening

## 2015-12-13 NOTE — Progress Notes (Signed)
Pre visit review using our clinic review tool, if applicable. No additional management support is needed unless otherwise documented below in the visit note. 

## 2015-12-13 NOTE — Assessment & Plan Note (Signed)
Resolved with simple ear irrigation  Relief of symptoms  Did have a dull and erythematous TM behind obst-will tx for OM also   Update if no further improvement

## 2015-12-16 DIAGNOSIS — M7062 Trochanteric bursitis, left hip: Secondary | ICD-10-CM | POA: Diagnosis not present

## 2015-12-23 DIAGNOSIS — M7062 Trochanteric bursitis, left hip: Secondary | ICD-10-CM | POA: Diagnosis not present

## 2015-12-23 DIAGNOSIS — M25552 Pain in left hip: Secondary | ICD-10-CM | POA: Diagnosis not present

## 2015-12-24 DIAGNOSIS — L821 Other seborrheic keratosis: Secondary | ICD-10-CM | POA: Diagnosis not present

## 2015-12-24 DIAGNOSIS — D485 Neoplasm of uncertain behavior of skin: Secondary | ICD-10-CM | POA: Diagnosis not present

## 2015-12-24 DIAGNOSIS — D0471 Carcinoma in situ of skin of right lower limb, including hip: Secondary | ICD-10-CM | POA: Diagnosis not present

## 2015-12-27 DIAGNOSIS — M7062 Trochanteric bursitis, left hip: Secondary | ICD-10-CM | POA: Diagnosis not present

## 2015-12-27 DIAGNOSIS — M25552 Pain in left hip: Secondary | ICD-10-CM | POA: Diagnosis not present

## 2015-12-30 DIAGNOSIS — M25552 Pain in left hip: Secondary | ICD-10-CM | POA: Diagnosis not present

## 2015-12-30 DIAGNOSIS — M7062 Trochanteric bursitis, left hip: Secondary | ICD-10-CM | POA: Diagnosis not present

## 2016-01-01 ENCOUNTER — Ambulatory Visit (INDEPENDENT_AMBULATORY_CARE_PROVIDER_SITE_OTHER): Payer: PPO | Admitting: Internal Medicine

## 2016-01-01 ENCOUNTER — Encounter: Payer: Self-pay | Admitting: Internal Medicine

## 2016-01-01 VITALS — BP 110/60 | HR 84 | Temp 98.4°F | Wt 141.8 lb

## 2016-01-01 DIAGNOSIS — J069 Acute upper respiratory infection, unspecified: Secondary | ICD-10-CM | POA: Diagnosis not present

## 2016-01-01 DIAGNOSIS — B9789 Other viral agents as the cause of diseases classified elsewhere: Principal | ICD-10-CM

## 2016-01-01 MED ORDER — HYDROCODONE-HOMATROPINE 5-1.5 MG/5ML PO SYRP: 5.0000 mL | ORAL_SOLUTION | Freq: Three times a day (TID) | ORAL | Status: DC | PRN

## 2016-01-01 NOTE — Progress Notes (Signed)
Pre visit review using our clinic review tool, if applicable. No additional management support is needed unless otherwise documented below in the visit note. 

## 2016-01-01 NOTE — Progress Notes (Signed)
HPI  Pt presents to the clinic today with c/o chills and chest congestion. This started yesterday. She had difficulty sleeping last night due to chills and difficulty breathing. She reports fatigue, headache, cough, nasal congestion, ear fullness, neck pressure and facial pressure.  She is blowing clear mucous out of her nose. The cough is non productive. She denies shortness of breath. She denies PND, fever, chest pain or muscle aches. She has had sick contacts at work. She does have seasonal allergies. She was treated a month ago for a possible ear infection with Amoxicillin. She has had her flu shot this year.   Review of Systems      Past Medical History  Diagnosis Date  . Allergy     allergic rhinitis  . Back pain     disc disease lumbar  . Hyperlipidemia   . Osteopenia   . Hyperthyroidism     Grave's disease (tx with radioactive I)  . Yeast vaginitis     recurrent- keeps terazol on hand / otc does not work    Family History  Problem Relation Age of Onset  . Osteoporosis Mother   . Heart disease Mother     mild heart problem  . Arthritis Father     RA    Social History   Social History  . Marital Status: Divorced    Spouse Name: N/A  . Number of Children: N/A  . Years of Education: N/A   Occupational History  . Not on file.   Social History Main Topics  . Smoking status: Former Smoker    Quit date: 11/23/1981  . Smokeless tobacco: Never Used  . Alcohol Use: 0.0 oz/week    0 Standard drinks or equivalent per week     Comment: occassionally  . Drug Use: No  . Sexual Activity: Not on file   Other Topics Concern  . Not on file   Social History Narrative    No Known Allergies   Constitutional: Positive headache, chills and fatigue. Denies fever. HEENT:  Positive sore throat, facial pressure, eye pressure, nasal congestion and ear fullness. Respiratory: Positive cough. Positive difficulty breathing and SOB.  Cardiovascular: Denies chest pain, chest  tightness, palpitations or swelling in the hands or feet.   No other specific complaints in a complete review of systems (except as listed in HPI above).  Objective:  BP 110/60 mmHg  Pulse 84  Temp(Src) 98.4 F (36.9 C) (Oral)  Wt 141 lb 12 oz (64.297 kg)  Wt Readings from Last 3 Encounters:  12/13/15 138 lb 4 oz (62.71 kg)  11/29/15 138 lb 8 oz (62.823 kg)  09/14/14 133 lb (60.328 kg)     General: Appears her stated age, in NAD. HEENT: Head: normal shape and size, no sinus tenderness noted; Eyes: sclera white, no icterus, conjunctiva pink; Ears: Tm's gray and intact with fluid behind TMs B/L, normal light reflex; Nose: mucosa pink and moist, septum midline; Throat/Mouth: Teeth present, mucosa erythematous and moist, no exudate noted, no lesions or ulcerations noted.  Neck: Anterior cervical lymphadenopathy.  Cardiovascular: Normal rate and rhythm. S1,S2 noted.  No murmur, rubs or gallops noted.  Pulmonary/Chest: Normal effort and positive vesicular breath sounds. No respiratory distress. No wheezes, rales or ronchi noted.      Assessment & Plan:   Viral URI:  Get some rest and drink plenty of water Depo-medrol 80mg  injection Mucinex OTC BID   Hycodan cough syrup  RTC as needed or if symptoms persist.

## 2016-01-01 NOTE — Patient Instructions (Signed)
Upper Respiratory Infection, Adult Most upper respiratory infections (URIs) are a viral infection of the air passages leading to the lungs. A URI affects the nose, throat, and upper air passages. The most common type of URI is nasopharyngitis and is typically referred to as "the common cold." URIs run their course and usually go away on their own. Most of the time, a URI does not require medical attention, but sometimes a bacterial infection in the upper airways can follow a viral infection. This is called a secondary infection. Sinus and middle ear infections are common types of secondary upper respiratory infections. Bacterial pneumonia can also complicate a URI. A URI can worsen asthma and chronic obstructive pulmonary disease (COPD). Sometimes, these complications can require emergency medical care and may be life threatening.  CAUSES Almost all URIs are caused by viruses. A virus is a type of germ and can spread from one person to another.  RISKS FACTORS You may be at risk for a URI if:   You smoke.   You have chronic heart or lung disease.  You have a weakened defense (immune) system.   You are very young or very old.   You have nasal allergies or asthma.  You work in crowded or poorly ventilated areas.  You work in health care facilities or schools. SIGNS AND SYMPTOMS  Symptoms typically develop 2-3 days after you come in contact with a cold virus. Most viral URIs last 7-10 days. However, viral URIs from the influenza virus (flu virus) can last 14-18 days and are typically more severe. Symptoms may include:   Runny or stuffy (congested) nose.   Sneezing.   Cough.   Sore throat.   Headache.   Fatigue.   Fever.   Loss of appetite.   Pain in your forehead, behind your eyes, and over your cheekbones (sinus pain).  Muscle aches.  DIAGNOSIS  Your health care provider may diagnose a URI by:  Physical exam.  Tests to check that your symptoms are not due to  another condition such as:  Strep throat.  Sinusitis.  Pneumonia.  Asthma. TREATMENT  A URI goes away on its own with time. It cannot be cured with medicines, but medicines may be prescribed or recommended to relieve symptoms. Medicines may help:  Reduce your fever.  Reduce your cough.  Relieve nasal congestion. HOME CARE INSTRUCTIONS   Take medicines only as directed by your health care provider.   Gargle warm saltwater or take cough drops to comfort your throat as directed by your health care provider.  Use a warm mist humidifier or inhale steam from a shower to increase air moisture. This may make it easier to breathe.  Drink enough fluid to keep your urine clear or pale yellow.   Eat soups and other clear broths and maintain good nutrition.   Rest as needed.   Return to work when your temperature has returned to normal or as your health care provider advises. You may need to stay home longer to avoid infecting others. You can also use a face mask and careful hand washing to prevent spread of the virus.  Increase the usage of your inhaler if you have asthma.   Do not use any tobacco products, including cigarettes, chewing tobacco, or electronic cigarettes. If you need help quitting, ask your health care provider. PREVENTION  The best way to protect yourself from getting a cold is to practice good hygiene.   Avoid oral or hand contact with people with cold   symptoms.   Wash your hands often if contact occurs.  There is no clear evidence that vitamin C, vitamin E, echinacea, or exercise reduces the chance of developing a cold. However, it is always recommended to get plenty of rest, exercise, and practice good nutrition.  SEEK MEDICAL CARE IF:   You are getting worse rather than better.   Your symptoms are not controlled by medicine.   You have chills.  You have worsening shortness of breath.  You have brown or red mucus.  You have yellow or brown nasal  discharge.  You have pain in your face, especially when you bend forward.  You have a fever.  You have swollen neck glands.  You have pain while swallowing.  You have white areas in the back of your throat. SEEK IMMEDIATE MEDICAL CARE IF:   You have severe or persistent:  Headache.  Ear pain.  Sinus pain.  Chest pain.  You have chronic lung disease and any of the following:  Wheezing.  Prolonged cough.  Coughing up blood.  A change in your usual mucus.  You have a stiff neck.  You have changes in your:  Vision.  Hearing.  Thinking.  Mood. MAKE SURE YOU:   Understand these instructions.  Will watch your condition.  Will get help right away if you are not doing well or get worse.   This information is not intended to replace advice given to you by your health care provider. Make sure you discuss any questions you have with your health care provider.   Document Released: 05/05/2001 Document Revised: 03/26/2015 Document Reviewed: 02/14/2014 Elsevier Interactive Patient Education 2016 [employer]  

## 2016-01-03 ENCOUNTER — Other Ambulatory Visit: Payer: Self-pay | Admitting: Internal Medicine

## 2016-01-03 ENCOUNTER — Telehealth: Payer: Self-pay

## 2016-01-03 MED ORDER — AZITHROMYCIN 250 MG PO TABS: ORAL_TABLET | ORAL | Status: DC

## 2016-01-03 NOTE — Telephone Encounter (Signed)
zpack sent to [facility]

## 2016-01-03 NOTE — Telephone Encounter (Signed)
Pt left v/m; pt was seen 01/01/16; congestion in head and chest no better and request z pak and/or more prednisone to [facility] [address]. Pt request cb.

## 2016-01-03 NOTE — Telephone Encounter (Signed)
Pt notified Rx sent to pharmacy

## 2016-01-06 NOTE — Telephone Encounter (Signed)
Prednisone is not indicated. Did she take any of the zpack?

## 2016-01-06 NOTE — Telephone Encounter (Addendum)
Pt left v/m; pt seen 01/21/16 and Z pak was sent to pharmacy; pt states zpak bothered her stomach and pt request prednisone rx to [facility] [address]; pt still has prod cough with green phlegm and congestion. Please advise.pt request cb.

## 2016-01-06 NOTE — Telephone Encounter (Signed)
Pt states she has been taking the Zpack and has 2 pills left---pt reports she has had to take imodium to help with the Sx---please advise

## 2016-01-06 NOTE — Telephone Encounter (Signed)
I would continue zpack and imodium

## 2016-01-07 NOTE — Telephone Encounter (Signed)
Pt called back wanting to know what to do Best number [phone]

## 2016-01-07 NOTE — Telephone Encounter (Signed)
Pt is aware as instructed and if not better or worse advised to call early Friday to f/u

## 2016-01-16 DIAGNOSIS — D0471 Carcinoma in situ of skin of right lower limb, including hip: Secondary | ICD-10-CM | POA: Diagnosis not present

## 2016-02-11 DIAGNOSIS — H524 Presbyopia: Secondary | ICD-10-CM | POA: Diagnosis not present

## 2016-02-11 DIAGNOSIS — H40032 Anatomical narrow angle, left eye: Secondary | ICD-10-CM | POA: Diagnosis not present

## 2016-02-11 DIAGNOSIS — H40031 Anatomical narrow angle, right eye: Secondary | ICD-10-CM | POA: Diagnosis not present

## 2016-02-11 DIAGNOSIS — H2513 Age-related nuclear cataract, bilateral: Secondary | ICD-10-CM | POA: Diagnosis not present

## 2016-02-20 DIAGNOSIS — J309 Allergic rhinitis, unspecified: Secondary | ICD-10-CM | POA: Diagnosis not present

## 2016-02-20 DIAGNOSIS — H6982 Other specified disorders of Eustachian tube, left ear: Secondary | ICD-10-CM | POA: Diagnosis not present

## 2016-02-20 DIAGNOSIS — H903 Sensorineural hearing loss, bilateral: Secondary | ICD-10-CM | POA: Diagnosis not present

## 2016-03-03 DIAGNOSIS — J301 Allergic rhinitis due to pollen: Secondary | ICD-10-CM | POA: Diagnosis not present

## 2016-03-10 DIAGNOSIS — J301 Allergic rhinitis due to pollen: Secondary | ICD-10-CM | POA: Diagnosis not present

## 2016-03-12 DIAGNOSIS — J301 Allergic rhinitis due to pollen: Secondary | ICD-10-CM | POA: Diagnosis not present

## 2016-03-16 DIAGNOSIS — J301 Allergic rhinitis due to pollen: Secondary | ICD-10-CM | POA: Diagnosis not present

## 2016-03-19 DIAGNOSIS — J301 Allergic rhinitis due to pollen: Secondary | ICD-10-CM | POA: Diagnosis not present

## 2016-03-23 DIAGNOSIS — J301 Allergic rhinitis due to pollen: Secondary | ICD-10-CM | POA: Diagnosis not present

## 2016-03-26 DIAGNOSIS — J301 Allergic rhinitis due to pollen: Secondary | ICD-10-CM | POA: Diagnosis not present

## 2016-03-27 DIAGNOSIS — J301 Allergic rhinitis due to pollen: Secondary | ICD-10-CM | POA: Diagnosis not present

## 2016-03-30 DIAGNOSIS — J301 Allergic rhinitis due to pollen: Secondary | ICD-10-CM | POA: Diagnosis not present

## 2016-04-02 DIAGNOSIS — J301 Allergic rhinitis due to pollen: Secondary | ICD-10-CM | POA: Diagnosis not present

## 2016-04-06 DIAGNOSIS — J301 Allergic rhinitis due to pollen: Secondary | ICD-10-CM | POA: Diagnosis not present

## 2016-04-09 DIAGNOSIS — J301 Allergic rhinitis due to pollen: Secondary | ICD-10-CM | POA: Diagnosis not present

## 2016-04-13 DIAGNOSIS — J301 Allergic rhinitis due to pollen: Secondary | ICD-10-CM | POA: Diagnosis not present

## 2016-04-15 DIAGNOSIS — J301 Allergic rhinitis due to pollen: Secondary | ICD-10-CM | POA: Diagnosis not present

## 2016-04-16 DIAGNOSIS — J301 Allergic rhinitis due to pollen: Secondary | ICD-10-CM | POA: Diagnosis not present

## 2016-04-23 DIAGNOSIS — J301 Allergic rhinitis due to pollen: Secondary | ICD-10-CM | POA: Diagnosis not present

## 2016-04-27 DIAGNOSIS — J301 Allergic rhinitis due to pollen: Secondary | ICD-10-CM | POA: Diagnosis not present

## 2016-04-30 DIAGNOSIS — J301 Allergic rhinitis due to pollen: Secondary | ICD-10-CM | POA: Diagnosis not present

## 2016-05-04 DIAGNOSIS — E890 Postprocedural hypothyroidism: Secondary | ICD-10-CM | POA: Diagnosis not present

## 2016-05-04 DIAGNOSIS — E559 Vitamin D deficiency, unspecified: Secondary | ICD-10-CM | POA: Diagnosis not present

## 2016-05-04 DIAGNOSIS — J301 Allergic rhinitis due to pollen: Secondary | ICD-10-CM | POA: Diagnosis not present

## 2016-05-07 DIAGNOSIS — E559 Vitamin D deficiency, unspecified: Secondary | ICD-10-CM | POA: Diagnosis not present

## 2016-05-07 DIAGNOSIS — J301 Allergic rhinitis due to pollen: Secondary | ICD-10-CM | POA: Diagnosis not present

## 2016-05-07 DIAGNOSIS — E890 Postprocedural hypothyroidism: Secondary | ICD-10-CM | POA: Diagnosis not present

## 2016-05-07 DIAGNOSIS — M8589 Other specified disorders of bone density and structure, multiple sites: Secondary | ICD-10-CM | POA: Diagnosis not present

## 2016-05-08 ENCOUNTER — Other Ambulatory Visit: Payer: Self-pay | Admitting: Family Medicine

## 2016-05-08 DIAGNOSIS — Z1231 Encounter for screening mammogram for malignant neoplasm of breast: Secondary | ICD-10-CM

## 2016-05-08 DIAGNOSIS — J301 Allergic rhinitis due to pollen: Secondary | ICD-10-CM | POA: Diagnosis not present

## 2016-05-11 DIAGNOSIS — J301 Allergic rhinitis due to pollen: Secondary | ICD-10-CM | POA: Diagnosis not present

## 2016-05-14 DIAGNOSIS — J301 Allergic rhinitis due to pollen: Secondary | ICD-10-CM | POA: Diagnosis not present

## 2016-05-18 DIAGNOSIS — J301 Allergic rhinitis due to pollen: Secondary | ICD-10-CM | POA: Diagnosis not present

## 2016-05-19 ENCOUNTER — Ambulatory Visit
Admission: RE | Admit: 2016-05-19 | Discharge: 2016-05-19 | Disposition: A | Discharge status: Home / Self Care | Payer: PPO | Source: Ambulatory Visit | Attending: Family Medicine | Admitting: Family Medicine

## 2016-05-19 DIAGNOSIS — Z1231 Encounter for screening mammogram for malignant neoplasm of breast: Secondary | ICD-10-CM | POA: Diagnosis not present

## 2016-05-19 LAB — HM MAMMOGRAPHY

## 2016-05-20 ENCOUNTER — Encounter: Payer: Self-pay | Admitting: *Deleted

## 2016-05-21 DIAGNOSIS — J301 Allergic rhinitis due to pollen: Secondary | ICD-10-CM | POA: Diagnosis not present

## 2016-05-25 DIAGNOSIS — J301 Allergic rhinitis due to pollen: Secondary | ICD-10-CM | POA: Diagnosis not present

## 2016-05-28 DIAGNOSIS — J301 Allergic rhinitis due to pollen: Secondary | ICD-10-CM | POA: Diagnosis not present

## 2016-05-29 DIAGNOSIS — J301 Allergic rhinitis due to pollen: Secondary | ICD-10-CM | POA: Diagnosis not present

## 2016-06-01 DIAGNOSIS — J301 Allergic rhinitis due to pollen: Secondary | ICD-10-CM | POA: Diagnosis not present

## 2016-06-04 DIAGNOSIS — J301 Allergic rhinitis due to pollen: Secondary | ICD-10-CM | POA: Diagnosis not present

## 2016-06-08 DIAGNOSIS — J301 Allergic rhinitis due to pollen: Secondary | ICD-10-CM | POA: Diagnosis not present

## 2016-06-11 DIAGNOSIS — J301 Allergic rhinitis due to pollen: Secondary | ICD-10-CM | POA: Diagnosis not present

## 2016-06-15 DIAGNOSIS — J301 Allergic rhinitis due to pollen: Secondary | ICD-10-CM | POA: Diagnosis not present

## 2016-06-18 DIAGNOSIS — J301 Allergic rhinitis due to pollen: Secondary | ICD-10-CM | POA: Diagnosis not present

## 2016-06-25 DIAGNOSIS — J301 Allergic rhinitis due to pollen: Secondary | ICD-10-CM | POA: Diagnosis not present

## 2016-07-02 DIAGNOSIS — J301 Allergic rhinitis due to pollen: Secondary | ICD-10-CM | POA: Diagnosis not present

## 2016-07-09 DIAGNOSIS — J301 Allergic rhinitis due to pollen: Secondary | ICD-10-CM | POA: Diagnosis not present

## 2016-07-16 DIAGNOSIS — J301 Allergic rhinitis due to pollen: Secondary | ICD-10-CM | POA: Diagnosis not present

## 2016-07-23 DIAGNOSIS — J301 Allergic rhinitis due to pollen: Secondary | ICD-10-CM | POA: Diagnosis not present

## 2016-07-30 DIAGNOSIS — J301 Allergic rhinitis due to pollen: Secondary | ICD-10-CM | POA: Diagnosis not present

## 2016-08-03 DIAGNOSIS — H40031 Anatomical narrow angle, right eye: Secondary | ICD-10-CM | POA: Diagnosis not present

## 2016-08-03 DIAGNOSIS — H40032 Anatomical narrow angle, left eye: Secondary | ICD-10-CM | POA: Diagnosis not present

## 2016-08-03 DIAGNOSIS — H2513 Age-related nuclear cataract, bilateral: Secondary | ICD-10-CM | POA: Diagnosis not present

## 2016-08-06 DIAGNOSIS — J301 Allergic rhinitis due to pollen: Secondary | ICD-10-CM | POA: Diagnosis not present

## 2016-08-13 DIAGNOSIS — J301 Allergic rhinitis due to pollen: Secondary | ICD-10-CM | POA: Diagnosis not present

## 2016-08-21 DIAGNOSIS — J301 Allergic rhinitis due to pollen: Secondary | ICD-10-CM | POA: Diagnosis not present

## 2016-08-24 DIAGNOSIS — J301 Allergic rhinitis due to pollen: Secondary | ICD-10-CM | POA: Diagnosis not present

## 2016-08-27 DIAGNOSIS — H25012 Cortical age-related cataract, left eye: Secondary | ICD-10-CM | POA: Diagnosis not present

## 2016-08-27 DIAGNOSIS — H25812 Combined forms of age-related cataract, left eye: Secondary | ICD-10-CM | POA: Diagnosis not present

## 2016-08-27 DIAGNOSIS — H2512 Age-related nuclear cataract, left eye: Secondary | ICD-10-CM | POA: Diagnosis not present

## 2016-08-31 DIAGNOSIS — J301 Allergic rhinitis due to pollen: Secondary | ICD-10-CM | POA: Diagnosis not present

## 2016-08-31 DIAGNOSIS — H903 Sensorineural hearing loss, bilateral: Secondary | ICD-10-CM | POA: Diagnosis not present

## 2016-08-31 DIAGNOSIS — R05 Cough: Secondary | ICD-10-CM | POA: Diagnosis not present

## 2016-09-07 DIAGNOSIS — J301 Allergic rhinitis due to pollen: Secondary | ICD-10-CM | POA: Diagnosis not present

## 2016-09-14 DIAGNOSIS — J301 Allergic rhinitis due to pollen: Secondary | ICD-10-CM | POA: Diagnosis not present

## 2016-09-17 DIAGNOSIS — H25811 Combined forms of age-related cataract, right eye: Secondary | ICD-10-CM | POA: Diagnosis not present

## 2016-09-17 DIAGNOSIS — H2511 Age-related nuclear cataract, right eye: Secondary | ICD-10-CM | POA: Diagnosis not present

## 2016-09-17 DIAGNOSIS — H25011 Cortical age-related cataract, right eye: Secondary | ICD-10-CM | POA: Diagnosis not present

## 2016-09-24 DIAGNOSIS — J301 Allergic rhinitis due to pollen: Secondary | ICD-10-CM | POA: Diagnosis not present

## 2016-10-01 DIAGNOSIS — J301 Allergic rhinitis due to pollen: Secondary | ICD-10-CM | POA: Diagnosis not present

## 2016-10-08 DIAGNOSIS — J301 Allergic rhinitis due to pollen: Secondary | ICD-10-CM | POA: Diagnosis not present

## 2016-10-14 DIAGNOSIS — J301 Allergic rhinitis due to pollen: Secondary | ICD-10-CM | POA: Diagnosis not present

## 2016-10-22 DIAGNOSIS — J301 Allergic rhinitis due to pollen: Secondary | ICD-10-CM | POA: Diagnosis not present

## 2016-10-29 DIAGNOSIS — J301 Allergic rhinitis due to pollen: Secondary | ICD-10-CM | POA: Diagnosis not present

## 2016-11-03 DIAGNOSIS — E559 Vitamin D deficiency, unspecified: Secondary | ICD-10-CM | POA: Diagnosis not present

## 2016-11-03 DIAGNOSIS — E890 Postprocedural hypothyroidism: Secondary | ICD-10-CM | POA: Diagnosis not present

## 2016-11-05 DIAGNOSIS — E890 Postprocedural hypothyroidism: Secondary | ICD-10-CM | POA: Diagnosis not present

## 2016-11-05 DIAGNOSIS — M8589 Other specified disorders of bone density and structure, multiple sites: Secondary | ICD-10-CM | POA: Diagnosis not present

## 2016-11-05 DIAGNOSIS — E559 Vitamin D deficiency, unspecified: Secondary | ICD-10-CM | POA: Diagnosis not present

## 2016-11-05 DIAGNOSIS — J301 Allergic rhinitis due to pollen: Secondary | ICD-10-CM | POA: Diagnosis not present

## 2016-11-06 ENCOUNTER — Ambulatory Visit: Payer: PPO

## 2016-11-06 ENCOUNTER — Ambulatory Visit (INDEPENDENT_AMBULATORY_CARE_PROVIDER_SITE_OTHER): Payer: PPO

## 2016-11-06 DIAGNOSIS — Z23 Encounter for immunization: Secondary | ICD-10-CM

## 2016-11-19 DIAGNOSIS — J301 Allergic rhinitis due to pollen: Secondary | ICD-10-CM | POA: Diagnosis not present

## 2016-11-20 DIAGNOSIS — J301 Allergic rhinitis due to pollen: Secondary | ICD-10-CM | POA: Diagnosis not present

## 2016-11-21 ENCOUNTER — Telehealth: Payer: Self-pay | Admitting: Family Medicine

## 2016-11-21 DIAGNOSIS — E559 Vitamin D deficiency, unspecified: Secondary | ICD-10-CM

## 2016-11-21 DIAGNOSIS — Z0000 Encounter for general adult medical examination without abnormal findings: Secondary | ICD-10-CM

## 2016-11-21 NOTE — Telephone Encounter (Signed)
-----   Message from Ellamae Sia sent at 11/17/2016 11:17 AM EST ----- Regarding: Lab orders for Wednesday, 1.3.18 Patient is scheduled for CPX labs, please order future labs, Thanks , Karna Christmas

## 2016-11-25 ENCOUNTER — Other Ambulatory Visit (INDEPENDENT_AMBULATORY_CARE_PROVIDER_SITE_OTHER): Payer: PPO

## 2016-11-25 DIAGNOSIS — Z0000 Encounter for general adult medical examination without abnormal findings: Secondary | ICD-10-CM | POA: Diagnosis not present

## 2016-11-25 DIAGNOSIS — R5383 Other fatigue: Secondary | ICD-10-CM

## 2016-11-25 DIAGNOSIS — E559 Vitamin D deficiency, unspecified: Secondary | ICD-10-CM | POA: Diagnosis not present

## 2016-11-25 LAB — COMPREHENSIVE METABOLIC PANEL
ALT: 14 U/L (ref 0–35)
AST: 17 U/L (ref 0–37)
Albumin: 4.1 g/dL (ref 3.5–5.2)
Alkaline Phosphatase: 108 U/L (ref 39–117)
BUN: 12 mg/dL (ref 6–23)
CALCIUM: 9.3 mg/dL (ref 8.4–10.5)
CHLORIDE: 106 meq/L (ref 96–112)
CO2: 29 meq/L (ref 19–32)
Creatinine, Ser: 0.76 mg/dL (ref 0.40–1.20)
GFR: 80.37 mL/min (ref >60.00)
Glucose, Bld: 86 mg/dL (ref 70–99)
POTASSIUM: 4 meq/L (ref 3.5–5.1)
Sodium: 141 mEq/L (ref 135–145)
Total Bilirubin: 0.7 mg/dL (ref 0.2–1.2)
Total Protein: 6.8 g/dL (ref 6.0–8.3)

## 2016-11-25 LAB — LIPID PANEL
CHOL/HDL RATIO: 3
Cholesterol: 210 mg/dL — ABNORMAL HIGH (ref 0–200)
HDL: 66 mg/dL (ref >39.00)
LDL CALC: 132 mg/dL — AB (ref 0–99)
NonHDL: 143.85
TRIGLYCERIDES: 59 mg/dL (ref 0.0–149.0)
VLDL: 11.8 mg/dL (ref 0.0–40.0)

## 2016-11-25 LAB — CBC WITH DIFFERENTIAL/PLATELET
Basophils Absolute: 0 10*3/uL (ref 0.0–0.1)
Basophils Relative: 0.7 % (ref 0.0–3.0)
EOS ABS: 0.1 10*3/uL (ref 0.0–0.7)
Eosinophils Relative: 2.2 % (ref 0.0–5.0)
HEMATOCRIT: 37.5 % (ref 36.0–46.0)
Hemoglobin: 12.8 g/dL (ref 12.0–15.0)
LYMPHS ABS: 1.1 10*3/uL (ref 0.7–4.0)
LYMPHS PCT: 26.7 % (ref 12.0–46.0)
MCHC: 34.3 g/dL (ref 30.0–36.0)
MCV: 94 fl (ref 78.0–100.0)
MONOS PCT: 8.2 % (ref 3.0–12.0)
Monocytes Absolute: 0.3 10*3/uL (ref 0.1–1.0)
NEUTROS ABS: 2.5 10*3/uL (ref 1.4–7.7)
Neutrophils Relative %: 62.2 % (ref 43.0–77.0)
PLATELETS: 234 10*3/uL (ref 150.0–400.0)
RBC: 3.99 Mil/uL (ref 3.87–5.11)
RDW: 13.9 % (ref 11.5–15.5)
WBC: 4 10*3/uL (ref 4.0–10.5)

## 2016-11-25 LAB — VITAMIN D 25 HYDROXY (VIT D DEFICIENCY, FRACTURES): VITD: 54.31 ng/mL (ref 30.00–100.00)

## 2016-11-25 LAB — TSH: TSH: 0.27 u[IU]/mL — ABNORMAL LOW (ref 0.35–4.50)

## 2016-11-25 LAB — VITAMIN B12: VITAMIN B 12: 214 pg/mL (ref 211–911)

## 2016-11-25 NOTE — Addendum Note (Signed)
Addended by: Marchia Bond on: 11/25/2016 09:10 AM   Modules accepted: Orders

## 2016-11-26 DIAGNOSIS — J301 Allergic rhinitis due to pollen: Secondary | ICD-10-CM | POA: Diagnosis not present

## 2016-12-01 ENCOUNTER — Encounter: Payer: Self-pay | Admitting: Family Medicine

## 2016-12-01 ENCOUNTER — Ambulatory Visit (INDEPENDENT_AMBULATORY_CARE_PROVIDER_SITE_OTHER): Payer: PPO | Admitting: Family Medicine

## 2016-12-01 VITALS — BP 102/60 | HR 68 | Temp 98.5°F | Wt 138.5 lb

## 2016-12-01 DIAGNOSIS — R1011 Right upper quadrant pain: Secondary | ICD-10-CM | POA: Insufficient documentation

## 2016-12-01 DIAGNOSIS — Z0000 Encounter for general adult medical examination without abnormal findings: Secondary | ICD-10-CM | POA: Diagnosis not present

## 2016-12-01 DIAGNOSIS — E2839 Other primary ovarian failure: Secondary | ICD-10-CM | POA: Insufficient documentation

## 2016-12-01 DIAGNOSIS — E538 Deficiency of other specified B group vitamins: Secondary | ICD-10-CM | POA: Diagnosis not present

## 2016-12-01 DIAGNOSIS — M8589 Other specified disorders of bone density and structure, multiple sites: Secondary | ICD-10-CM

## 2016-12-01 DIAGNOSIS — E559 Vitamin D deficiency, unspecified: Secondary | ICD-10-CM

## 2016-12-01 DIAGNOSIS — Z1159 Encounter for screening for other viral diseases: Secondary | ICD-10-CM | POA: Insufficient documentation

## 2016-12-01 DIAGNOSIS — E890 Postprocedural hypothyroidism: Secondary | ICD-10-CM

## 2016-12-01 MED ORDER — CYANOCOBALAMIN 1000 MCG/ML IJ SOLN
1000.0000 ug | Freq: Once | INTRAMUSCULAR | Status: AC
  Administered 2016-12-01: 1000 ug via INTRAMUSCULAR

## 2016-12-01 NOTE — Patient Instructions (Addendum)
Labs for hep C today  Stop up front for referral for abd ultrasound and also dexa  Get a vitamin B12 supplement over the counter and take 1000 mcg per day  Also B12 shot today  Watch diet for cholesterol    Avoid red meat/ fried foods/ egg yolks/ fatty breakfast meats/ butter, cheese and high fat dairy/ and shellfish    Take care of yourself  Keep exercising

## 2016-12-01 NOTE — Progress Notes (Signed)
Pre visit review using our clinic review tool, if applicable. No additional management support is needed unless otherwise documented below in the visit note. 

## 2016-12-01 NOTE — Assessment & Plan Note (Signed)
Reviewed health habits including diet and exercise and skin cancer prevention Reviewed appropriate screening tests for age  Also reviewed health mt list, fam hx and immunization status , as well as social and family history   See HPI utd imms Start tx for B12 def  New abd pain- [place] ordered dexa ordered for osteopenia-due  Labs rev Will try to cut cholesterol in diet  Hep C screen today  Will call Dr Elliot's office to see when next colonoscopy is due

## 2016-12-01 NOTE — Assessment & Plan Note (Signed)
New/intermittent/moderate Will ref for [place] to r/o gallstones

## 2016-12-01 NOTE — Assessment & Plan Note (Signed)
Lab Results  Component Value Date   [id number] 11/25/2016   Shot today  Then supplement with 1000 mcg daily otc Balanced diet/more veggies also

## 2016-12-01 NOTE — Assessment & Plan Note (Signed)
Test for hep C ab today

## 2016-12-01 NOTE — Progress Notes (Signed)
Subjective:    Patient ID: Angela Bell, female    DOB: [birth year], 71 y.o.   MRN: [id number]  HPI  Here for health maintenance exam and to review chronic medical problems    Feeling good overall   Wt Readings from Last 3 Encounters:  12/01/16 138 lb 8 oz (62.8 kg)  01/01/16 141 lb 12 oz (64.3 kg)  12/13/15 138 lb 4 oz (62.7 kg)  wt is down 3 lb  Eats a healthy diet and exercises  Stationary bike/walk/weights - wants to join an exercise group  bmi of 23.4  She gets pain in RUQ  Still has a gallbladder  Pain is sharp and intermittent   Hep C screen=she is interested in screening  Thinks she is low risk   Had eye exam  Had cataract surg Is glaucoma suspect-watching   Mammogram 6/17-neg Self breast exam-no lumps   Gyn - still has her parts No symptoms  Never had abn pap  No hx of sexual activity lately    colonoscopy 6/12 polyps ? What the call back was -thinks 83 y  GF and aunt had colon cancer   dexa 11/15- osteopenia with dec spine score  Vit D level 54.3 Due for dexa  No falls in over 10 years  No fractures  Taking ca and D Exercises    utd imms   Hypothyroidism  Pt has no clinical changes No change in energy level/ hair or skin/ edema and no tremor Lab Results  Component Value Date   TSH 0.27 (L) 11/25/2016    She went to the endocrinologist and it was fine-no changes made     Results for orders placed or performed in visit on 11/25/16  CBC with Differential/Platelet  Result Value Ref Range   WBC 4.0 4.0 - 10.5 K/uL   RBC 3.99 3.87 - 5.11 Mil/uL   Hemoglobin 12.8 12.0 - 15.0 g/dL   HCT 37.5 36.0 - 46.0 %   MCV 94.0 78.0 - 100.0 fl   MCHC 34.3 30.0 - 36.0 g/dL   RDW 13.9 11.5 - 15.5 %   Platelets 234.0 150.0 - 400.0 K/uL   Neutrophils Relative % 62.2 43.0 - 77.0 %   Lymphocytes Relative 26.7 12.0 - 46.0 %   Monocytes Relative 8.2 3.0 - 12.0 %   Eosinophils Relative 2.2 0.0 - 5.0 %   Basophils Relative 0.7 0.0 - 3.0 %   Neutro Abs  2.5 1.4 - 7.7 K/uL   Lymphs Abs 1.1 0.7 - 4.0 K/uL   Monocytes Absolute 0.3 0.1 - 1.0 K/uL   Eosinophils Absolute 0.1 0.0 - 0.7 K/uL   Basophils Absolute 0.0 0.0 - 0.1 K/uL  Comprehensive metabolic panel  Result Value Ref Range   Sodium 141 135 - 145 mEq/L   Potassium 4.0 3.5 - 5.1 mEq/L   Chloride 106 96 - 112 mEq/L   CO2 29 19 - 32 mEq/L   Glucose, Bld 86 70 - 99 mg/dL   BUN 12 6 - 23 mg/dL   Creatinine, Ser 0.76 0.40 - 1.20 mg/dL   Total Bilirubin 0.7 0.2 - 1.2 mg/dL   Alkaline Phosphatase 108 39 - 117 U/L   AST 17 0 - 37 U/L   ALT 14 0 - 35 U/L   Total Protein 6.8 6.0 - 8.3 g/dL   Albumin 4.1 3.5 - 5.2 g/dL   Calcium 9.3 8.4 - 10.5 mg/dL   GFR 80.37 >60.00 mL/min  Lipid panel  Result Value Ref  Range   Cholesterol 210 (H) 0 - 200 mg/dL   Triglycerides 59.0 0.0 - 149.0 mg/dL   HDL 66.00 >39.00 mg/dL   VLDL 11.8 0.0 - 40.0 mg/dL   LDL Cholesterol 132 (H) 0 - 99 mg/dL   Total CHOL/HDL Ratio 3    NonHDL 143.85   TSH  Result Value Ref Range   TSH 0.27 (L) 0.35 - 4.50 uIU/mL  VITAMIN D 25 Hydroxy (Vit-D Deficiency, Fractures)  Result Value Ref Range   VITD 54.31 30.00 - 100.00 ng/mL  Vitamin B12  Result Value Ref Range   Vitamin B-12 214 211 - 911 pg/mL    LDL is up  Eating more eggs  No red meat or fried foods   B12 is a little low  occ takes mvi  No B vits  Does not eat a lot of veg  Some broccoli     Review of Systems Review of Systems  Constitutional: Negative for fever, appetite change, fatigue and unexpected weight change.  Eyes: Negative for pain and visual disturbance.  Respiratory: Negative for cough and shortness of breath.   Cardiovascular: Negative for cp or palpitations    Gastrointestinal: Negative for nausea, diarrhea and constipation. pos for indigestion- occ takes zantac  Genitourinary: Negative for urgency and frequency.  Skin: Negative for pallor or rash   Neurological: Negative for weakness, light-headedness, numbness and headaches.    Hematological: Negative for adenopathy. Does not bruise/bleed easily.  Psychiatric/Behavioral: Negative for dysphoric mood. The patient is not nervous/anxious.          Objective:   Physical Exam  Constitutional: She appears well-developed and well-nourished. No distress.  Well appearing   HENT:  Head: Normocephalic and atraumatic.  Right Ear: External ear normal.  Left Ear: External ear normal.  Mouth/Throat: Oropharynx is clear and moist.  Scant cerumen bilaterally  Eyes: Conjunctivae and EOM are normal. Pupils are equal, round, and reactive to light. No scleral icterus.  Neck: Normal range of motion. Neck supple. No JVD present. Carotid bruit is not present. No thyromegaly present.  Cardiovascular: Normal rate, regular rhythm, normal heart sounds and intact distal pulses.  Exam reveals no gallop.   Pulmonary/Chest: Effort normal and breath sounds normal. No respiratory distress. She has no wheezes. She exhibits no tenderness.  Abdominal: Soft. Bowel sounds are normal. She exhibits no distension, no abdominal bruit and no mass. There is no tenderness. There is no rebound and no guarding.  No tenderness on exam today  Genitourinary: No breast swelling, tenderness, discharge or bleeding.  Genitourinary Comments: Breast exam: No mass, nodules, thickening, tenderness, bulging, retraction, inflamation, nipple discharge or skin changes noted.  No axillary or clavicular LA.      Musculoskeletal: Normal range of motion. She exhibits no edema or tenderness.  No kyphosis   Lymphadenopathy:    She has no cervical adenopathy.  Neurological: She is alert. She has normal reflexes. No cranial nerve deficit. She exhibits normal muscle tone. Coordination normal.  Skin: Skin is warm and dry. No rash noted. No erythema. No pallor.  Solar lentigines   Psychiatric: She has a normal mood and affect.          Assessment & Plan:   Problem List Items Addressed This Visit      Endocrine    Hypothyroid - Primary    S/p ablation in the past  Sees endocrine Lab Results  Component Value Date   TSH 0.27 (L) 11/25/2016   this is lower here Given a  copy        Musculoskeletal and Integument   Osteopenia    Ref for 4 y dexa  No falls or fx On ca and D Exercises   Disc need for calcium/ vitamin D/ wt bearing exercise and bone density test every 2 y to monitor Disc safety/ fracture risk in detail          Other   B12 deficiency    Lab Results  Component Value Date   VITAMINB12 214 11/25/2016   Shot today  Then supplement with 1000 mcg daily otc Balanced diet/more veggies also       Relevant Medications   cyanocobalamin ((VITAMIN B-12)) injection 1,000 mcg (Completed)   Encounter for hepatitis C screening test for low risk patient    Test for hep C ab today      Relevant Orders   Hepatitis C antibody (Completed)   Estrogen deficiency   Relevant Orders   DG Bone Density   Routine general medical examination at a health care facility    Reviewed health habits including diet and exercise and skin cancer prevention Reviewed appropriate screening tests for age  Also reviewed health mt list, fam hx and immunization status , as well as social and family history   See HPI utd imms Start tx for B12 def  New abd pain- [place] ordered dexa ordered for osteopenia-due  Labs rev Will try to cut cholesterol in diet  Hep C screen today  Will call Dr Elliot's office to see when next colonoscopy is due       RUQ pain    New/intermittent/moderate Will ref for [place] to r/o gallstones      Relevant Orders   US Abdomen Limited RUQ (Completed)   Vitamin D deficiency    Vitamin D level is therapeutic with current supplementation Disc importance of this to bone and overall health

## 2016-12-01 NOTE — Assessment & Plan Note (Signed)
S/p ablation in the past  Sees endocrine Lab Results  Component Value Date   TSH 0.27 (L) 11/25/2016   this is lower here Given a copy

## 2016-12-01 NOTE — Assessment & Plan Note (Signed)
Vitamin D level is therapeutic with current supplementation Disc importance of this to bone and overall health  

## 2016-12-01 NOTE — Assessment & Plan Note (Signed)
Ref for 4 y dexa  No falls or fx On ca and D Exercises   Disc need for calcium/ vitamin D/ wt bearing exercise and bone density test every 2 y to monitor Disc safety/ fracture risk in detail

## 2016-12-02 ENCOUNTER — Ambulatory Visit
Admission: RE | Admit: 2016-12-02 | Discharge: 2016-12-02 | Disposition: A | Discharge status: Home / Self Care | Payer: PPO | Source: Ambulatory Visit | Attending: Family Medicine | Admitting: Family Medicine

## 2016-12-02 DIAGNOSIS — K760 Fatty (change of) liver, not elsewhere classified: Secondary | ICD-10-CM | POA: Insufficient documentation

## 2016-12-02 DIAGNOSIS — R1011 Right upper quadrant pain: Secondary | ICD-10-CM

## 2016-12-02 LAB — HEPATITIS C ANTIBODY: HCV Ab: NEGATIVE

## 2016-12-08 DIAGNOSIS — J301 Allergic rhinitis due to pollen: Secondary | ICD-10-CM | POA: Diagnosis not present

## 2016-12-17 DIAGNOSIS — J301 Allergic rhinitis due to pollen: Secondary | ICD-10-CM | POA: Diagnosis not present

## 2016-12-24 DIAGNOSIS — J301 Allergic rhinitis due to pollen: Secondary | ICD-10-CM | POA: Diagnosis not present

## 2016-12-28 ENCOUNTER — Ambulatory Visit: Payer: PPO | Admitting: Family Medicine

## 2016-12-31 DIAGNOSIS — J301 Allergic rhinitis due to pollen: Secondary | ICD-10-CM | POA: Diagnosis not present

## 2017-01-07 DIAGNOSIS — J301 Allergic rhinitis due to pollen: Secondary | ICD-10-CM | POA: Diagnosis not present

## 2017-01-11 ENCOUNTER — Ambulatory Visit
Admission: RE | Admit: 2017-01-11 | Discharge: 2017-01-11 | Disposition: A | Discharge status: Home / Self Care | Payer: PPO | Source: Ambulatory Visit | Attending: Family Medicine | Admitting: Family Medicine

## 2017-01-11 DIAGNOSIS — E039 Hypothyroidism, unspecified: Secondary | ICD-10-CM | POA: Diagnosis not present

## 2017-01-11 DIAGNOSIS — M8588 Other specified disorders of bone density and structure, other site: Secondary | ICD-10-CM | POA: Diagnosis not present

## 2017-01-11 DIAGNOSIS — E2839 Other primary ovarian failure: Secondary | ICD-10-CM | POA: Insufficient documentation

## 2017-01-11 DIAGNOSIS — Z780 Asymptomatic menopausal state: Secondary | ICD-10-CM | POA: Diagnosis not present

## 2017-01-11 DIAGNOSIS — M85852 Other specified disorders of bone density and structure, left thigh: Secondary | ICD-10-CM | POA: Diagnosis not present

## 2017-01-11 LAB — HM DEXA SCAN

## 2017-01-12 DIAGNOSIS — M9902 Segmental and somatic dysfunction of thoracic region: Secondary | ICD-10-CM | POA: Diagnosis not present

## 2017-01-12 DIAGNOSIS — M9901 Segmental and somatic dysfunction of cervical region: Secondary | ICD-10-CM | POA: Diagnosis not present

## 2017-01-12 DIAGNOSIS — M5134 Other intervertebral disc degeneration, thoracic region: Secondary | ICD-10-CM | POA: Diagnosis not present

## 2017-01-12 DIAGNOSIS — M542 Cervicalgia: Secondary | ICD-10-CM | POA: Diagnosis not present

## 2017-01-14 DIAGNOSIS — J301 Allergic rhinitis due to pollen: Secondary | ICD-10-CM | POA: Diagnosis not present

## 2017-01-19 DIAGNOSIS — M542 Cervicalgia: Secondary | ICD-10-CM | POA: Diagnosis not present

## 2017-01-19 DIAGNOSIS — M5134 Other intervertebral disc degeneration, thoracic region: Secondary | ICD-10-CM | POA: Diagnosis not present

## 2017-01-19 DIAGNOSIS — M9901 Segmental and somatic dysfunction of cervical region: Secondary | ICD-10-CM | POA: Diagnosis not present

## 2017-01-19 DIAGNOSIS — M9902 Segmental and somatic dysfunction of thoracic region: Secondary | ICD-10-CM | POA: Diagnosis not present

## 2017-01-21 DIAGNOSIS — J301 Allergic rhinitis due to pollen: Secondary | ICD-10-CM | POA: Diagnosis not present

## 2017-01-25 ENCOUNTER — Encounter: Payer: Self-pay | Admitting: *Deleted

## 2017-01-28 DIAGNOSIS — J301 Allergic rhinitis due to pollen: Secondary | ICD-10-CM | POA: Diagnosis not present

## 2017-02-04 DIAGNOSIS — J301 Allergic rhinitis due to pollen: Secondary | ICD-10-CM | POA: Diagnosis not present

## 2017-02-09 DIAGNOSIS — M5134 Other intervertebral disc degeneration, thoracic region: Secondary | ICD-10-CM | POA: Diagnosis not present

## 2017-02-09 DIAGNOSIS — M9901 Segmental and somatic dysfunction of cervical region: Secondary | ICD-10-CM | POA: Diagnosis not present

## 2017-02-09 DIAGNOSIS — M9902 Segmental and somatic dysfunction of thoracic region: Secondary | ICD-10-CM | POA: Diagnosis not present

## 2017-02-09 DIAGNOSIS — M542 Cervicalgia: Secondary | ICD-10-CM | POA: Diagnosis not present

## 2017-02-11 DIAGNOSIS — J301 Allergic rhinitis due to pollen: Secondary | ICD-10-CM | POA: Diagnosis not present

## 2017-02-12 DIAGNOSIS — J301 Allergic rhinitis due to pollen: Secondary | ICD-10-CM | POA: Diagnosis not present

## 2017-02-18 DIAGNOSIS — J301 Allergic rhinitis due to pollen: Secondary | ICD-10-CM | POA: Diagnosis not present

## 2017-02-25 DIAGNOSIS — J301 Allergic rhinitis due to pollen: Secondary | ICD-10-CM | POA: Diagnosis not present

## 2017-03-01 DIAGNOSIS — H903 Sensorineural hearing loss, bilateral: Secondary | ICD-10-CM | POA: Diagnosis not present

## 2017-03-01 DIAGNOSIS — H93299 Other abnormal auditory perceptions, unspecified ear: Secondary | ICD-10-CM | POA: Diagnosis not present

## 2017-03-01 DIAGNOSIS — J309 Allergic rhinitis, unspecified: Secondary | ICD-10-CM | POA: Diagnosis not present

## 2017-03-04 DIAGNOSIS — J301 Allergic rhinitis due to pollen: Secondary | ICD-10-CM | POA: Diagnosis not present

## 2017-03-11 DIAGNOSIS — J301 Allergic rhinitis due to pollen: Secondary | ICD-10-CM | POA: Diagnosis not present

## 2017-03-18 DIAGNOSIS — J301 Allergic rhinitis due to pollen: Secondary | ICD-10-CM | POA: Diagnosis not present

## 2017-03-25 DIAGNOSIS — J301 Allergic rhinitis due to pollen: Secondary | ICD-10-CM | POA: Diagnosis not present

## 2017-04-01 DIAGNOSIS — J301 Allergic rhinitis due to pollen: Secondary | ICD-10-CM | POA: Diagnosis not present

## 2017-04-08 DIAGNOSIS — J301 Allergic rhinitis due to pollen: Secondary | ICD-10-CM | POA: Diagnosis not present

## 2017-04-15 DIAGNOSIS — J301 Allergic rhinitis due to pollen: Secondary | ICD-10-CM | POA: Diagnosis not present

## 2017-04-22 DIAGNOSIS — J301 Allergic rhinitis due to pollen: Secondary | ICD-10-CM | POA: Diagnosis not present

## 2017-04-26 DIAGNOSIS — H40013 Open angle with borderline findings, low risk, bilateral: Secondary | ICD-10-CM | POA: Diagnosis not present

## 2017-04-29 DIAGNOSIS — J301 Allergic rhinitis due to pollen: Secondary | ICD-10-CM | POA: Diagnosis not present

## 2017-05-03 DIAGNOSIS — E890 Postprocedural hypothyroidism: Secondary | ICD-10-CM | POA: Diagnosis not present

## 2017-05-03 DIAGNOSIS — Z8349 Family history of other endocrine, nutritional and metabolic diseases: Secondary | ICD-10-CM | POA: Diagnosis not present

## 2017-05-03 DIAGNOSIS — Z8639 Personal history of other endocrine, nutritional and metabolic disease: Secondary | ICD-10-CM | POA: Diagnosis not present

## 2017-05-03 DIAGNOSIS — M8589 Other specified disorders of bone density and structure, multiple sites: Secondary | ICD-10-CM | POA: Diagnosis not present

## 2017-05-03 DIAGNOSIS — E559 Vitamin D deficiency, unspecified: Secondary | ICD-10-CM | POA: Diagnosis not present

## 2017-05-03 DIAGNOSIS — M8580 Other specified disorders of bone density and structure, unspecified site: Secondary | ICD-10-CM | POA: Diagnosis not present

## 2017-05-06 DIAGNOSIS — J301 Allergic rhinitis due to pollen: Secondary | ICD-10-CM | POA: Diagnosis not present

## 2017-05-07 DIAGNOSIS — J301 Allergic rhinitis due to pollen: Secondary | ICD-10-CM | POA: Diagnosis not present

## 2017-05-12 DIAGNOSIS — M25521 Pain in right elbow: Secondary | ICD-10-CM | POA: Diagnosis not present

## 2017-05-12 DIAGNOSIS — M7702 Medial epicondylitis, left elbow: Secondary | ICD-10-CM | POA: Diagnosis not present

## 2017-05-13 DIAGNOSIS — J301 Allergic rhinitis due to pollen: Secondary | ICD-10-CM | POA: Diagnosis not present

## 2017-05-20 DIAGNOSIS — J301 Allergic rhinitis due to pollen: Secondary | ICD-10-CM | POA: Diagnosis not present

## 2017-05-27 DIAGNOSIS — J301 Allergic rhinitis due to pollen: Secondary | ICD-10-CM | POA: Diagnosis not present

## 2017-06-03 DIAGNOSIS — J301 Allergic rhinitis due to pollen: Secondary | ICD-10-CM | POA: Diagnosis not present

## 2017-06-17 DIAGNOSIS — J301 Allergic rhinitis due to pollen: Secondary | ICD-10-CM | POA: Diagnosis not present

## 2017-06-21 ENCOUNTER — Encounter (INDEPENDENT_AMBULATORY_CARE_PROVIDER_SITE_OTHER): Payer: Self-pay

## 2017-06-21 ENCOUNTER — Encounter: Payer: Self-pay | Admitting: Primary Care

## 2017-06-21 ENCOUNTER — Ambulatory Visit (INDEPENDENT_AMBULATORY_CARE_PROVIDER_SITE_OTHER): Payer: PPO | Admitting: Primary Care

## 2017-06-21 VITALS — BP 120/70 | HR 60 | Temp 97.8°F | Wt 137.0 lb

## 2017-06-21 DIAGNOSIS — T300 Burn of unspecified body region, unspecified degree: Secondary | ICD-10-CM

## 2017-06-21 MED ORDER — SILVER SULFADIAZINE 1 % EX CREA: 1.0000 "application " | TOPICAL_CREAM | Freq: Two times a day (BID) | CUTANEOUS | 0 refills | Status: DC

## 2017-06-21 NOTE — Patient Instructions (Signed)
Apply silver sulfadiazine cream twice daily until the burn heals. You can cover the burn with a dressing to prevent the cream from rubbing off.  Please notify me if you develop increased redness, increased pain, fevers.  It was a pleasure meeting you!   Burn Care, Adult A burn is an injury to the skin or the tissues under the skin. There are three types of burns:  First degree. These burns may cause the skin to be red and slightly swollen.  Second degree. These burns are very painful and cause the skin to be very red. The skin may also leak fluid, look shiny, and develop blisters.  Third degree. These burns cause permanent damage. They either turn the skin white or black and make it look charred, dry, and leathery.  Taking care of your burn properly can help to prevent pain and infection. It can also help the burn to heal more quickly. What are the risks? Complications from burns include:  Damage to the skin.  Reduced blood flow near the injury.  Dead tissue.  Scarring.  Problems with movement, if the burn happened near a joint or on the hands or feet.  Severe burns can lead to problems that affect the whole body, such as:  Fluid loss.  Less blood circulating in the body.  Inability to maintain a normal core body temperature (thermoregulation).  Infection.  Shock.  Problems breathing.  How to care for a first-degree burn Right after a burn:  Rinse or soak the burn under cool water until the pain stops. Do not put ice on your burn. This can cause more damage.  Lightly cover the burn with a sterile cloth (dressing). Burn care  Follow instructions from your health care provider about: ? How to clean and take care of the burn. ? When to change and remove the dressing.  Check your burn every day for signs of infection. Check for: ? More redness, swelling, or pain. ? Warmth. ? Pus or a bad smell. Medicine  Take over-the-counter and prescription medicines only  as told by your health care provider.  If you were prescribed antibiotic medicine, take or apply it as told by your health care provider. Do not stop using the antibiotic even if your condition improves. General instructions  To prevent infection, do not put butter, oil, or other home remedies on your burn.  Do not rub your burn, even when you are cleaning it.  Protect your burn from the sun. How to care for a second-degree burn Right after a burn:  Rinse or soak the burn under cool water. Do this for several minutes. Do not put ice on your burn. This can cause more damage.  Lightly cover the burn with a sterile cloth (dressing). Burn care  Raise (elevate) the injured area above the level of your heart while sitting or lying down.  Follow instructions from your health care provider about: ? How to clean and take care of the burn. ? When to change and remove the dressing.  Check your burn every day for signs of infection. Check for: ? More redness, swelling, or pain. ? Warmth. ? Pus or a bad smell. Medicine   Take over-the-counter and prescription medicines only as told by your health care provider.  If you were prescribed antibiotic medicine, take or apply it as told by your health care provider. Do not stop using the antibiotic even if your condition improves. General instructions  To prevent infection: ? Do not put  butter, oil, or other home remedies on the burn. ? Do not scratch or pick at the burn. ? Do not break any blisters. ? Do not peel skin.  Do not rub your burn, even when you are cleaning it.  Protect your burn from the sun. How to care for a third-degree burn Right after a burn:  Lightly cover the burn with gauze.  Seek immediate medical attention. Burn care  Raise (elevate) the injured area above the level of your heart while sitting or lying down.  Drink enough fluid to keep your urine clear or pale yellow.  Rest as told by your health care  provider. Do not participate in sports or other physical activities until your health care provider approves.  Follow instructions from your health care provider about: ? How to clean and take care of the burn. ? When to change and remove the dressing.  Check your burn every day for signs of infection. Check for: ? More redness, swelling, or pain. ? Warmth. ? Pus or a bad smell. Medicine  Take over-the-counter and prescription medicines only as told by your health care provider.  If you were prescribed antibiotic medicine, take or apply it as told by your health care provider. Do not stop using the antibiotic even if your condition improves. General instructions  To prevent infection: ? Do not put butter, oil, or other home remedies on the burn. ? Do not scratch or pick at the burn. ? Do not break any blisters. ? Do not peel skin. ? Do not rub your burn, even when you are cleaning it.  Protect your burn from the sun.  Keep all follow-up visits as told by your health care provider. This is important. Contact a health care provider if:  Your condition does not improve.  Your condition gets worse.  You have a fever.  Your burn changes in appearance or develops black or red spots.  Your burn feels warm to the touch.  Your pain is not controlled with medicine. Get help right away if:  You have redness, swelling, or pain at the site of the burn.  You have fluid, blood, or pus coming from your burn.  You have red streaks near the burn.  You have severe pain. This information is not intended to replace advice given to you by your health care provider. Make sure you discuss any questions you have with your health care provider. Document Released: 11/09/2005 Document Revised: 05/31/2016 Document Reviewed: 04/28/2016 Elsevier Interactive Patient Education  2018 [employer].

## 2017-06-21 NOTE — Progress Notes (Signed)
Subjective:    Patient ID: Angela Bell, female    DOB: 25-Sep-1946, 71 y.o.   MRN: [id number]  HPI  Ms. Chaidez is a 71 year old female who presents today with a chief complaint of burn. Her burn is located to the right lower extremity just distal and medial to the patella which occurred three days ago. She applied an ice pack to her knee after exercise on Thursday morning, used the ice pack intermittently throughout the day, and woke up the next day with the burn at the ice pack site. Her burn did blister and popped over the weekend, and has improved over the weekend. She's taken four prednisone tablets from her left over prescription, and applied Vasaline. Her burn is painful.   Review of Systems  Constitutional: Negative for fever.  Skin: Positive for color change and wound.       Past Medical History:  Diagnosis Date  . Allergy    allergic rhinitis  . Back pain    disc disease lumbar  . Hyperlipidemia   . Hyperthyroidism    Grave's disease (tx with radioactive I)  . Osteopenia   . Yeast vaginitis    recurrent- keeps terazol on hand / otc does not work     Social History   Social History  . Marital status: Divorced    Spouse name: N/A  . Number of children: N/A  . Years of education: N/A   Occupational History  . Not on file.   Social History Main Topics  . Smoking status: Former Smoker    Quit date: 11/23/1981  . Smokeless tobacco: Never Used  . Alcohol use 0.0 oz/week     Comment: occassionally  . Drug use: No  . Sexual activity: Not on file   Other Topics Concern  . Not on file   Social History Narrative  . No narrative on file    Past Surgical History:  Procedure Laterality Date  . BREAST SURGERY     breast reduction  . CESAREAN SECTION     x 2  . chin implant    . EYE SURGERY     x3 for strabismus  . REDUCTION MAMMAPLASTY      Family History  Problem Relation Age of Onset  . Osteoporosis Mother   . Heart disease Mother        mild  heart problem  . Arthritis Father        RA  . Breast cancer Cousin     No Known Allergies  Current Outpatient Prescriptions on File Prior to Visit  Medication Sig Dispense Refill  . Calcium Carbonate-Vit D-Min 600-400 MG-UNIT TABS Take 2 tablets by mouth daily.      . Cholecalciferol (VITAMIN D3) 5000 UNITS CAPS Take 1 capsule by mouth daily.      . fexofenadine-pseudoephedrine (ALLEGRA-D 12 HOUR) 60-120 MG per tablet Take 1 tablet by mouth 2 (two) times daily as needed. 60 tablet 3  . levothyroxine (SYNTHROID) 100 MCG tablet Take 100 mcg by mouth daily.      . Multiple Vitamin (MULTIVITAMIN) tablet Take 2 times a week     No current facility-administered medications on file prior to visit.     BP 120/70   Pulse 60   Temp 97.8 F (36.6 C) (Oral)   Wt 137 lb (62.1 kg)   SpO2 99%   BMI 23.15 kg/m    Objective:   Physical Exam  Constitutional: She appears well-nourished.  Neck: Neck  supple.  Cardiovascular: Normal rate and regular rhythm.   Pulmonary/Chest: Effort normal and breath sounds normal.  Skin: Skin is warm and dry. There is erythema.  4cm X 4 cm first degree burn to right lower extremity, distal and medial to patella. Does not appear infected.          Assessment & Plan:  Burn:  After applying an ice pack to the skin for sore knee. Exam today with first degree burn as noted above. Does not appear infectious. Treat with Silver Sulfadiazine cream BID until burn resolves.  Discussed to protect burn, provided handout for home care remedies.  Follow up PRN.  Sheral Flow, NP

## 2017-06-24 ENCOUNTER — Telehealth: Payer: Self-pay

## 2017-06-24 DIAGNOSIS — J301 Allergic rhinitis due to pollen: Secondary | ICD-10-CM | POA: Diagnosis not present

## 2017-06-24 DIAGNOSIS — T300 Burn of unspecified body region, unspecified degree: Secondary | ICD-10-CM

## 2017-06-24 MED ORDER — CEPHALEXIN 500 MG PO CAPS: 500.0000 mg | ORAL_CAPSULE | Freq: Three times a day (TID) | ORAL | 0 refills | Status: DC

## 2017-06-24 NOTE — Telephone Encounter (Signed)
Pt left v/m; pt was seen 06/21/17 and pt has been using the cream given at appt; pt thinks looks infected and hurting and pt request abx to [facility]. Pt request cb.

## 2017-06-24 NOTE — Telephone Encounter (Signed)
Patient advised.

## 2017-06-24 NOTE — Telephone Encounter (Signed)
Noted. Rx for cephalexin course sent to pharmacy. Take 1 capsule by mouth three times daily for 7 days. (Did ask patient to notify for symptoms of infection, appreciate update.)

## 2017-07-01 DIAGNOSIS — J301 Allergic rhinitis due to pollen: Secondary | ICD-10-CM | POA: Diagnosis not present

## 2017-07-08 DIAGNOSIS — J301 Allergic rhinitis due to pollen: Secondary | ICD-10-CM | POA: Diagnosis not present

## 2017-07-15 DIAGNOSIS — J301 Allergic rhinitis due to pollen: Secondary | ICD-10-CM | POA: Diagnosis not present

## 2017-07-22 DIAGNOSIS — J301 Allergic rhinitis due to pollen: Secondary | ICD-10-CM | POA: Diagnosis not present

## 2017-07-27 DIAGNOSIS — J301 Allergic rhinitis due to pollen: Secondary | ICD-10-CM | POA: Diagnosis not present

## 2017-08-05 DIAGNOSIS — J301 Allergic rhinitis due to pollen: Secondary | ICD-10-CM | POA: Diagnosis not present

## 2017-08-12 DIAGNOSIS — J301 Allergic rhinitis due to pollen: Secondary | ICD-10-CM | POA: Diagnosis not present

## 2017-08-19 DIAGNOSIS — J301 Allergic rhinitis due to pollen: Secondary | ICD-10-CM | POA: Diagnosis not present

## 2017-08-26 DIAGNOSIS — J301 Allergic rhinitis due to pollen: Secondary | ICD-10-CM | POA: Diagnosis not present

## 2017-09-02 DIAGNOSIS — J301 Allergic rhinitis due to pollen: Secondary | ICD-10-CM | POA: Diagnosis not present

## 2017-09-09 DIAGNOSIS — J301 Allergic rhinitis due to pollen: Secondary | ICD-10-CM | POA: Diagnosis not present

## 2017-09-23 DIAGNOSIS — J301 Allergic rhinitis due to pollen: Secondary | ICD-10-CM | POA: Diagnosis not present

## 2017-09-24 ENCOUNTER — Ambulatory Visit (INDEPENDENT_AMBULATORY_CARE_PROVIDER_SITE_OTHER): Payer: PPO

## 2017-09-24 DIAGNOSIS — Z23 Encounter for immunization: Secondary | ICD-10-CM

## 2017-09-30 DIAGNOSIS — J301 Allergic rhinitis due to pollen: Secondary | ICD-10-CM | POA: Diagnosis not present

## 2017-10-11 DIAGNOSIS — J301 Allergic rhinitis due to pollen: Secondary | ICD-10-CM | POA: Diagnosis not present

## 2017-10-20 DIAGNOSIS — J301 Allergic rhinitis due to pollen: Secondary | ICD-10-CM | POA: Diagnosis not present

## 2017-10-22 DIAGNOSIS — J301 Allergic rhinitis due to pollen: Secondary | ICD-10-CM | POA: Diagnosis not present

## 2017-10-25 DIAGNOSIS — H40013 Open angle with borderline findings, low risk, bilateral: Secondary | ICD-10-CM | POA: Diagnosis not present

## 2017-10-25 DIAGNOSIS — H04123 Dry eye syndrome of bilateral lacrimal glands: Secondary | ICD-10-CM | POA: Diagnosis not present

## 2017-10-25 DIAGNOSIS — Z961 Presence of intraocular lens: Secondary | ICD-10-CM | POA: Diagnosis not present

## 2017-10-28 DIAGNOSIS — E559 Vitamin D deficiency, unspecified: Secondary | ICD-10-CM | POA: Diagnosis not present

## 2017-10-28 DIAGNOSIS — E039 Hypothyroidism, unspecified: Secondary | ICD-10-CM | POA: Diagnosis not present

## 2017-10-28 DIAGNOSIS — J301 Allergic rhinitis due to pollen: Secondary | ICD-10-CM | POA: Diagnosis not present

## 2017-11-05 DIAGNOSIS — E559 Vitamin D deficiency, unspecified: Secondary | ICD-10-CM | POA: Diagnosis not present

## 2017-11-05 DIAGNOSIS — E890 Postprocedural hypothyroidism: Secondary | ICD-10-CM | POA: Diagnosis not present

## 2017-11-05 DIAGNOSIS — Z8639 Personal history of other endocrine, nutritional and metabolic disease: Secondary | ICD-10-CM | POA: Diagnosis not present

## 2017-11-05 DIAGNOSIS — M8580 Other specified disorders of bone density and structure, unspecified site: Secondary | ICD-10-CM | POA: Diagnosis not present

## 2017-11-08 DIAGNOSIS — J301 Allergic rhinitis due to pollen: Secondary | ICD-10-CM | POA: Diagnosis not present

## 2017-11-18 DIAGNOSIS — J301 Allergic rhinitis due to pollen: Secondary | ICD-10-CM | POA: Diagnosis not present

## 2017-11-25 ENCOUNTER — Other Ambulatory Visit: Payer: Self-pay | Admitting: Family Medicine

## 2017-11-25 DIAGNOSIS — J301 Allergic rhinitis due to pollen: Secondary | ICD-10-CM | POA: Diagnosis not present

## 2017-11-25 DIAGNOSIS — Z1231 Encounter for screening mammogram for malignant neoplasm of breast: Secondary | ICD-10-CM

## 2017-11-28 ENCOUNTER — Telehealth: Payer: Self-pay | Admitting: Family Medicine

## 2017-11-28 DIAGNOSIS — E890 Postprocedural hypothyroidism: Secondary | ICD-10-CM

## 2017-11-28 DIAGNOSIS — E538 Deficiency of other specified B group vitamins: Secondary | ICD-10-CM

## 2017-11-28 DIAGNOSIS — Z0000 Encounter for general adult medical examination without abnormal findings: Secondary | ICD-10-CM

## 2017-11-28 DIAGNOSIS — E559 Vitamin D deficiency, unspecified: Secondary | ICD-10-CM

## 2017-11-28 NOTE — Telephone Encounter (Signed)
-----   Message from Eustace Pen, LPN sent at 07/28/2840  3:40 PM EST ----- Regarding: Labs 1/10 Lab orders needed. Thank you.  Insurance:  [employer]

## 2017-12-01 ENCOUNTER — Other Ambulatory Visit: Payer: PPO

## 2017-12-02 ENCOUNTER — Ambulatory Visit (INDEPENDENT_AMBULATORY_CARE_PROVIDER_SITE_OTHER): Payer: PPO

## 2017-12-02 DIAGNOSIS — Z0000 Encounter for general adult medical examination without abnormal findings: Secondary | ICD-10-CM

## 2017-12-02 DIAGNOSIS — E538 Deficiency of other specified B group vitamins: Secondary | ICD-10-CM | POA: Diagnosis not present

## 2017-12-02 DIAGNOSIS — E559 Vitamin D deficiency, unspecified: Secondary | ICD-10-CM

## 2017-12-02 DIAGNOSIS — E890 Postprocedural hypothyroidism: Secondary | ICD-10-CM | POA: Diagnosis not present

## 2017-12-02 DIAGNOSIS — J301 Allergic rhinitis due to pollen: Secondary | ICD-10-CM | POA: Diagnosis not present

## 2017-12-02 LAB — LIPID PANEL
CHOL/HDL RATIO: 3
Cholesterol: 177 mg/dL (ref 0–200)
HDL: 58.4 mg/dL (ref >39.00)
LDL Cholesterol: 105 mg/dL — ABNORMAL HIGH (ref 0–99)
NONHDL: 118.33
Triglycerides: 65 mg/dL (ref 0.0–149.0)
VLDL: 13 mg/dL (ref 0.0–40.0)

## 2017-12-02 LAB — CBC WITH DIFFERENTIAL/PLATELET
Basophils Absolute: 0 10*3/uL (ref 0.0–0.1)
Basophils Relative: 0.8 % (ref 0.0–3.0)
EOS PCT: 1.9 % (ref 0.0–5.0)
Eosinophils Absolute: 0.1 10*3/uL (ref 0.0–0.7)
HCT: 39 % (ref 36.0–46.0)
Hemoglobin: 12.9 g/dL (ref 12.0–15.0)
LYMPHS ABS: 1.1 10*3/uL (ref 0.7–4.0)
Lymphocytes Relative: 26.7 % (ref 12.0–46.0)
MCHC: 33.1 g/dL (ref 30.0–36.0)
MCV: 96.5 fl (ref 78.0–100.0)
MONO ABS: 0.3 10*3/uL (ref 0.1–1.0)
MONOS PCT: 8.1 % (ref 3.0–12.0)
NEUTROS ABS: 2.7 10*3/uL (ref 1.4–7.7)
NEUTROS PCT: 62.5 % (ref 43.0–77.0)
PLATELETS: 247 10*3/uL (ref 150.0–400.0)
RBC: 4.04 Mil/uL (ref 3.87–5.11)
RDW: 12.9 % (ref 11.5–15.5)
WBC: 4.3 10*3/uL (ref 4.0–10.5)

## 2017-12-02 LAB — COMPREHENSIVE METABOLIC PANEL
ALK PHOS: 97 U/L (ref 39–117)
ALT: 10 U/L (ref 0–35)
AST: 13 U/L (ref 0–37)
Albumin: 4.1 g/dL (ref 3.5–5.2)
BUN: 13 mg/dL (ref 6–23)
CO2: 27 meq/L (ref 19–32)
Calcium: 9.2 mg/dL (ref 8.4–10.5)
Chloride: 105 mEq/L (ref 96–112)
Creatinine, Ser: 0.71 mg/dL (ref 0.40–1.20)
GFR: 86.68 mL/min (ref >60.00)
GLUCOSE: 94 mg/dL (ref 70–99)
POTASSIUM: 3.8 meq/L (ref 3.5–5.1)
SODIUM: 140 meq/L (ref 135–145)
TOTAL PROTEIN: 7.1 g/dL (ref 6.0–8.3)
Total Bilirubin: 0.7 mg/dL (ref 0.2–1.2)

## 2017-12-02 LAB — VITAMIN D 25 HYDROXY (VIT D DEFICIENCY, FRACTURES): VITD: 65.81 ng/mL (ref 30.00–100.00)

## 2017-12-02 LAB — VITAMIN B12: VITAMIN B 12: 444 pg/mL (ref 211–911)

## 2017-12-02 LAB — TSH: TSH: 0.11 u[IU]/mL — AB (ref 0.35–4.50)

## 2017-12-02 NOTE — Progress Notes (Signed)
Closing encounter. AWV was not completed. Lab work only.

## 2017-12-03 ENCOUNTER — Ambulatory Visit (INDEPENDENT_AMBULATORY_CARE_PROVIDER_SITE_OTHER): Payer: PPO | Admitting: Family Medicine

## 2017-12-03 ENCOUNTER — Encounter: Payer: Self-pay | Admitting: Family Medicine

## 2017-12-03 VITALS — BP 110/68 | HR 72 | Temp 98.2°F | Ht 64.0 in | Wt 138.0 lb

## 2017-12-03 DIAGNOSIS — Z1211 Encounter for screening for malignant neoplasm of colon: Secondary | ICD-10-CM

## 2017-12-03 DIAGNOSIS — Z0000 Encounter for general adult medical examination without abnormal findings: Secondary | ICD-10-CM

## 2017-12-03 DIAGNOSIS — E890 Postprocedural hypothyroidism: Secondary | ICD-10-CM

## 2017-12-03 DIAGNOSIS — E559 Vitamin D deficiency, unspecified: Secondary | ICD-10-CM | POA: Diagnosis not present

## 2017-12-03 DIAGNOSIS — Z1231 Encounter for screening mammogram for malignant neoplasm of breast: Secondary | ICD-10-CM | POA: Diagnosis not present

## 2017-12-03 DIAGNOSIS — M8589 Other specified disorders of bone density and structure, multiple sites: Secondary | ICD-10-CM | POA: Diagnosis not present

## 2017-12-03 DIAGNOSIS — E538 Deficiency of other specified B group vitamins: Secondary | ICD-10-CM

## 2017-12-03 NOTE — Assessment & Plan Note (Signed)
Lab Results  Component Value Date   VITAMINB12 444 12/02/2017   Good with 500 mcg of B12 daily orally  Will continue this

## 2017-12-03 NOTE — Assessment & Plan Note (Signed)
Ref for recall colonoscopy with Dr Tiffany Kocher

## 2017-12-03 NOTE — Assessment & Plan Note (Signed)
Rev dexa 2/18 No falls or fractures Intol of boniva in the past (cramps)  Disc ca and D D level good  Repeat dexa in a year

## 2017-12-03 NOTE — Assessment & Plan Note (Signed)
Sees Dr Altheimer  She had dose recently increased Here  Lab Results  Component Value Date   TSH 0.11 (L) 12/02/2017    Will send copy to her endocrinologist

## 2017-12-03 NOTE — Assessment & Plan Note (Signed)
Reviewed health habits including diet and exercise and skin cancer prevention Reviewed appropriate screening tests for age  Also reviewed health mt list, fam hx and immunization status , as well as social and family history   Labs rev Declines amw  Ref for recall colonoscopy  Enc good diet /exercise

## 2017-12-03 NOTE — Assessment & Plan Note (Signed)
Pt has mammogram set up for next month  Nl breast exam

## 2017-12-03 NOTE — Patient Instructions (Addendum)
Go for your mammogram as planned  We will refer you for colonoscopy   Take care of yourself  Keep up the great job with diet and exercise

## 2017-12-03 NOTE — Progress Notes (Signed)
Subjective:    Patient ID: Angela Bell, female    DOB: Oct 21, 1946, 72 y.o.   MRN: [id number]  HPI Here for health maintenance exam and to review chronic medical problems    Doing well  Can't complain Still working and 5 grandchildren-busy but great    Hs cancelled her amw   Wt Readings from Last 3 Encounters:  12/03/17 138 lb (62.6 kg)  06/21/17 137 lb (62.1 kg)  12/01/16 138 lb 8 oz (62.8 kg)  good job mt weight - it is not easy  Eats healthy  Does exercise at home /has a stationary bike  23.69 kg/m   Mammogram 6/17- she has appt coming up in February for 3D mammogram  Self breast exam - no lumps   Tetanus 1/12  Colonoscopy 6/12 -5 years recall  Is due for it  Wants to set that up   dexa 2/18-osteopenia- stable at hip / slt worse at the spine  D level is 65.8 Exercising  No falls or fractures  Tried boniva and it gave her cramps    Other imms utd   Hypothyroidism Sees endocrinology Lab Results  Component Value Date   TSH 0.11 (L) 12/02/2017   here  She had medication increased - goes back in 2 month Sees Dr Altheimer in [place] Lab Results  Component Value Date   [id number] 831 12/02/2017  takes 500 mcg daily  Cholesterol Lab Results  Component Value Date   CHOL 177 12/02/2017   CHOL 210 (H) 11/25/2016   CHOL 188 11/21/2015   Lab Results  Component Value Date   HDL 58.40 12/02/2017   HDL 66.00 11/25/2016   HDL 75.40 11/21/2015   Lab Results  Component Value Date   LDLCALC 105 (H) 12/02/2017   LDLCALC 132 (H) 11/25/2016   LDLCALC 101 (H) 11/21/2015   Lab Results  Component Value Date   TRIG 65.0 12/02/2017   TRIG 59.0 11/25/2016   TRIG 54.0 11/21/2015   Lab Results  Component Value Date   CHOLHDL 3 12/02/2017   CHOLHDL 3 11/25/2016   CHOLHDL 2 11/21/2015   Lab Results  Component Value Date   LDLDIRECT 146.3 08/22/2013   LDLDIRECT 128.6 04/19/2012   Eating more nuts for protein and no red meat  Improved     BP Readings from Last 3 Encounters:  12/03/17 110/68  06/21/17 120/70  12/01/16 102/60   Other labs Results for orders placed or performed in visit on 12/02/17  VITAMIN D 25 Hydroxy (Vit-D Deficiency, Fractures)  Result Value Ref Range   VITD 65.81 30.00 - 100.00 ng/mL  Vitamin B12  Result Value Ref Range   Vitamin B-12 444 211 - 911 pg/mL  TSH  Result Value Ref Range   TSH 0.11 (L) 0.35 - 4.50 uIU/mL  Lipid panel  Result Value Ref Range   Cholesterol 177 0 - 200 mg/dL   Triglycerides 65.0 0.0 - 149.0 mg/dL   HDL 58.40 >39.00 mg/dL   VLDL 13.0 0.0 - 40.0 mg/dL   LDL Cholesterol 105 (H) 0 - 99 mg/dL   Total CHOL/HDL Ratio 3    NonHDL 118.33   Comprehensive metabolic panel  Result Value Ref Range   Sodium 140 135 - 145 mEq/L   Potassium 3.8 3.5 - 5.1 mEq/L   Chloride 105 96 - 112 mEq/L   CO2 27 19 - 32 mEq/L   Glucose, Bld 94 70 - 99 mg/dL   BUN 13 6 - 23  mg/dL   Creatinine, Ser 0.71 0.40 - 1.20 mg/dL   Total Bilirubin 0.7 0.2 - 1.2 mg/dL   Alkaline Phosphatase 97 39 - 117 U/L   AST 13 0 - 37 U/L   ALT 10 0 - 35 U/L   Total Protein 7.1 6.0 - 8.3 g/dL   Albumin 4.1 3.5 - 5.2 g/dL   Calcium 9.2 8.4 - 10.5 mg/dL   GFR 86.68 >60.00 mL/min  CBC with Differential/Platelet  Result Value Ref Range   WBC 4.3 4.0 - 10.5 K/uL   RBC 4.04 3.87 - 5.11 Mil/uL   Hemoglobin 12.9 12.0 - 15.0 g/dL   HCT 39.0 36.0 - 46.0 %   MCV 96.5 78.0 - 100.0 fl   MCHC 33.1 30.0 - 36.0 g/dL   RDW 12.9 11.5 - 15.5 %   Platelets 247.0 150.0 - 400.0 K/uL   Neutrophils Relative % 62.5 43.0 - 77.0 %   Lymphocytes Relative 26.7 12.0 - 46.0 %   Monocytes Relative 8.1 3.0 - 12.0 %   Eosinophils Relative 1.9 0.0 - 5.0 %   Basophils Relative 0.8 0.0 - 3.0 %   Neutro Abs 2.7 1.4 - 7.7 K/uL   Lymphs Abs 1.1 0.7 - 4.0 K/uL   Monocytes Absolute 0.3 0.1 - 1.0 K/uL   Eosinophils Absolute 0.1 0.0 - 0.7 K/uL   Basophils Absolute 0.0 0.0 - 0.1 K/uL     Patient Active Problem List   Diagnosis Date  Noted  . Colon cancer screening 12/03/2017  . Encounter for hepatitis C screening test for low risk patient 12/01/2016  . Estrogen deficiency 12/01/2016  . B12 deficiency 12/01/2016  . Routine general medical examination at a health care facility 11/20/2015  . Encounter for Medicare annual wellness exam 08/21/2013  . Dyspepsia 04/25/2012  . Encounter for screening mammogram for breast cancer 12/22/2011  . ALLERGIC RHINITIS 12/14/2008  . Vitamin D deficiency 08/23/2008  . Hypothyroid 07/26/2008  . Osteopenia 07/23/2008   Past Medical History:  Diagnosis Date  . Allergy    allergic rhinitis  . Back pain    disc disease lumbar  . Hyperlipidemia   . Hyperthyroidism    Grave's disease (tx with radioactive I)  . Osteopenia   . Yeast vaginitis    recurrent- keeps terazol on hand / otc does not work   Past Surgical History:  Procedure Laterality Date  . BREAST SURGERY     breast reduction  . CESAREAN SECTION     x 2  . chin implant    . EYE SURGERY     x3 for strabismus  . REDUCTION MAMMAPLASTY     Social History   Tobacco Use  . Smoking status: Former Smoker    Last attempt to quit: 11/23/1981    Years since quitting: 36.0  . Smokeless tobacco: Never Used  Substance Use Topics  . Alcohol use: Yes    Alcohol/week: 0.0 oz    Comment: occassionally  . Drug use: No   Family History  Problem Relation Age of Onset  . Osteoporosis Mother   . Heart disease Mother        mild heart problem  . Arthritis Father        RA  . Breast cancer Cousin    Allergies  Allergen Reactions  . Boniva [Ibandronic Acid]     Leg cramping    Current Outpatient Medications on File Prior to Visit  Medication Sig Dispense Refill  . Calcium Carbonate-Vit D-Min 600-400 MG-UNIT TABS  Take 2 tablets by mouth daily.      . Cholecalciferol (VITAMIN D3) 5000 UNITS CAPS Take 1 capsule by mouth daily.      . fexofenadine-pseudoephedrine (ALLEGRA-D 12 HOUR) 60-120 MG per tablet Take 1 tablet by  mouth 2 (two) times daily as needed. 60 tablet 3  . levothyroxine (SYNTHROID) 112 MCG tablet Take 1 tablet by mouth every morning.    . Multiple Vitamin (MULTIVITAMIN) tablet Take 2 times a week     No current facility-administered medications on file prior to visit.     Review of Systems  Constitutional: Negative for activity change, appetite change, fatigue, fever and unexpected weight change.  HENT: Negative for congestion, ear pain, rhinorrhea, sinus pressure and sore throat.   Eyes: Negative for pain, redness and visual disturbance.  Respiratory: Negative for cough, shortness of breath and wheezing.   Cardiovascular: Negative for chest pain and palpitations.  Gastrointestinal: Negative for abdominal pain, blood in stool, constipation and diarrhea.  Endocrine: Negative for polydipsia and polyuria.  Genitourinary: Negative for dysuria, frequency and urgency.  Musculoskeletal: Negative for arthralgias, back pain and myalgias.  Skin: Negative for pallor and rash.  Allergic/Immunologic: Negative for environmental allergies.  Neurological: Negative for dizziness, syncope and headaches.  Hematological: Negative for adenopathy. Does not bruise/bleed easily.  Psychiatric/Behavioral: Negative for decreased concentration and dysphoric mood. The patient is not nervous/anxious.        Objective:   Physical Exam  Constitutional: She appears well-developed and well-nourished. No distress.  Well appearing   HENT:  Head: Normocephalic and atraumatic.  Right Ear: External ear normal.  Left Ear: External ear normal.  Mouth/Throat: Oropharynx is clear and moist.  Eyes: Conjunctivae and EOM are normal. Pupils are equal, round, and reactive to light. No scleral icterus.  Neck: Normal range of motion. Neck supple. No JVD present. Carotid bruit is not present. No thyromegaly present.  Cardiovascular: Normal rate, regular rhythm, normal heart sounds and intact distal pulses. Exam reveals no gallop.   Pulmonary/Chest: Effort normal and breath sounds normal. No respiratory distress. She has no wheezes. She exhibits no tenderness.  Abdominal: Soft. Bowel sounds are normal. She exhibits no distension, no abdominal bruit and no mass. There is no tenderness.  Genitourinary: No breast swelling, tenderness, discharge or bleeding.  Genitourinary Comments: Breast exam: No mass, nodules, thickening, tenderness, bulging, retraction, inflamation, nipple discharge or skin changes noted.  No axillary or clavicular LA.      Musculoskeletal: Normal range of motion. She exhibits no edema or tenderness.  No kyphosis   Lymphadenopathy:    She has no cervical adenopathy.  Neurological: She is alert. She has normal reflexes. No cranial nerve deficit. She exhibits normal muscle tone. Coordination normal.  Skin: Skin is warm and dry. No rash noted. No erythema. No pallor.  Solar lentigines diffusely   Psychiatric: She has a normal mood and affect.          Assessment & Plan:   Problem List Items Addressed This Visit      Endocrine   Hypothyroid    Sees Dr Altheimer  She had dose recently increased Here  Lab Results  Component Value Date   TSH 0.11 (L) 12/02/2017    Will send copy to her endocrinologist       Relevant Medications   levothyroxine (SYNTHROID) 112 MCG tablet     Musculoskeletal and Integument   Osteopenia    Rev dexa 2/18 No falls or fractures Intol of boniva in the past (cramps)  Disc ca and D D level good  Repeat dexa in a year         Other   B12 deficiency    Lab Results  Component Value Date   VITAMINB12 444 12/02/2017   Good with 500 mcg of B12 daily orally  Will continue this       Colon cancer screening    Ref for recall colonoscopy with Dr Tiffany Kocher       Relevant Orders   Ambulatory referral to Gastroenterology   Encounter for screening mammogram for breast cancer    Pt has mammogram set up for next month  Nl breast exam       Routine general  medical examination at a health care facility - Primary    Reviewed health habits including diet and exercise and skin cancer prevention Reviewed appropriate screening tests for age  Also reviewed health mt list, fam hx and immunization status , as well as social and family history   Labs rev Declines amw  Ref for recall colonoscopy  Enc good diet /exercise        Vitamin D deficiency    Vitamin D level is therapeutic with current supplementation Disc importance of this to bone and overall health

## 2017-12-03 NOTE — Assessment & Plan Note (Signed)
Vitamin D level is therapeutic with current supplementation Disc importance of this to bone and overall health  

## 2017-12-09 DIAGNOSIS — J301 Allergic rhinitis due to pollen: Secondary | ICD-10-CM | POA: Diagnosis not present

## 2017-12-13 ENCOUNTER — Ambulatory Visit
Admission: RE | Admit: 2017-12-13 | Discharge: 2017-12-13 | Disposition: A | Discharge status: Home / Self Care | Payer: PPO | Source: Ambulatory Visit | Attending: Family Medicine | Admitting: Family Medicine

## 2017-12-13 DIAGNOSIS — Z1231 Encounter for screening mammogram for malignant neoplasm of breast: Secondary | ICD-10-CM | POA: Diagnosis not present

## 2017-12-20 DIAGNOSIS — J301 Allergic rhinitis due to pollen: Secondary | ICD-10-CM | POA: Diagnosis not present

## 2017-12-27 DIAGNOSIS — J301 Allergic rhinitis due to pollen: Secondary | ICD-10-CM | POA: Diagnosis not present

## 2017-12-27 DIAGNOSIS — E890 Postprocedural hypothyroidism: Secondary | ICD-10-CM | POA: Diagnosis not present

## 2018-01-03 DIAGNOSIS — E559 Vitamin D deficiency, unspecified: Secondary | ICD-10-CM | POA: Diagnosis not present

## 2018-01-03 DIAGNOSIS — Z8639 Personal history of other endocrine, nutritional and metabolic disease: Secondary | ICD-10-CM | POA: Diagnosis not present

## 2018-01-03 DIAGNOSIS — E890 Postprocedural hypothyroidism: Secondary | ICD-10-CM | POA: Diagnosis not present

## 2018-01-03 DIAGNOSIS — M8580 Other specified disorders of bone density and structure, unspecified site: Secondary | ICD-10-CM | POA: Diagnosis not present

## 2018-01-06 DIAGNOSIS — J301 Allergic rhinitis due to pollen: Secondary | ICD-10-CM | POA: Diagnosis not present

## 2018-01-13 DIAGNOSIS — J301 Allergic rhinitis due to pollen: Secondary | ICD-10-CM | POA: Diagnosis not present

## 2018-01-14 DIAGNOSIS — J301 Allergic rhinitis due to pollen: Secondary | ICD-10-CM | POA: Diagnosis not present

## 2018-01-20 DIAGNOSIS — J301 Allergic rhinitis due to pollen: Secondary | ICD-10-CM | POA: Diagnosis not present

## 2018-01-27 DIAGNOSIS — J301 Allergic rhinitis due to pollen: Secondary | ICD-10-CM | POA: Diagnosis not present

## 2018-02-03 DIAGNOSIS — J301 Allergic rhinitis due to pollen: Secondary | ICD-10-CM | POA: Diagnosis not present

## 2018-02-03 DIAGNOSIS — R1011 Right upper quadrant pain: Secondary | ICD-10-CM | POA: Diagnosis not present

## 2018-02-03 DIAGNOSIS — K760 Fatty (change of) liver, not elsewhere classified: Secondary | ICD-10-CM | POA: Diagnosis not present

## 2018-02-03 DIAGNOSIS — Z8371 Family history of colonic polyps: Secondary | ICD-10-CM | POA: Diagnosis not present

## 2018-02-10 DIAGNOSIS — Z23 Encounter for immunization: Secondary | ICD-10-CM | POA: Diagnosis not present

## 2018-02-10 DIAGNOSIS — J301 Allergic rhinitis due to pollen: Secondary | ICD-10-CM | POA: Diagnosis not present

## 2018-02-17 DIAGNOSIS — J301 Allergic rhinitis due to pollen: Secondary | ICD-10-CM | POA: Diagnosis not present

## 2018-03-03 DIAGNOSIS — J301 Allergic rhinitis due to pollen: Secondary | ICD-10-CM | POA: Diagnosis not present

## 2018-03-10 DIAGNOSIS — Z23 Encounter for immunization: Secondary | ICD-10-CM | POA: Diagnosis not present

## 2018-03-10 DIAGNOSIS — J301 Allergic rhinitis due to pollen: Secondary | ICD-10-CM | POA: Diagnosis not present

## 2018-03-14 DIAGNOSIS — E039 Hypothyroidism, unspecified: Secondary | ICD-10-CM | POA: Diagnosis not present

## 2018-03-21 DIAGNOSIS — M8580 Other specified disorders of bone density and structure, unspecified site: Secondary | ICD-10-CM | POA: Diagnosis not present

## 2018-03-21 DIAGNOSIS — J301 Allergic rhinitis due to pollen: Secondary | ICD-10-CM | POA: Diagnosis not present

## 2018-03-21 DIAGNOSIS — E890 Postprocedural hypothyroidism: Secondary | ICD-10-CM | POA: Diagnosis not present

## 2018-03-21 DIAGNOSIS — E559 Vitamin D deficiency, unspecified: Secondary | ICD-10-CM | POA: Diagnosis not present

## 2018-03-21 DIAGNOSIS — Z8639 Personal history of other endocrine, nutritional and metabolic disease: Secondary | ICD-10-CM | POA: Diagnosis not present

## 2018-03-31 DIAGNOSIS — J301 Allergic rhinitis due to pollen: Secondary | ICD-10-CM | POA: Diagnosis not present

## 2018-04-07 DIAGNOSIS — J301 Allergic rhinitis due to pollen: Secondary | ICD-10-CM | POA: Diagnosis not present

## 2018-04-08 DIAGNOSIS — J301 Allergic rhinitis due to pollen: Secondary | ICD-10-CM | POA: Diagnosis not present

## 2018-04-14 DIAGNOSIS — J301 Allergic rhinitis due to pollen: Secondary | ICD-10-CM | POA: Diagnosis not present

## 2018-04-21 DIAGNOSIS — J301 Allergic rhinitis due to pollen: Secondary | ICD-10-CM | POA: Diagnosis not present

## 2018-04-22 ENCOUNTER — Encounter: Payer: Self-pay | Admitting: *Deleted

## 2018-04-25 ENCOUNTER — Ambulatory Visit: Payer: PPO | Admitting: Anesthesiology

## 2018-04-25 ENCOUNTER — Ambulatory Visit
Admission: RE | Admit: 2018-04-25 | Discharge: 2018-04-25 | Disposition: A | Discharge status: Home / Self Care | Payer: PPO | Source: Ambulatory Visit | Attending: Unknown Physician Specialty | Admitting: Unknown Physician Specialty

## 2018-04-25 ENCOUNTER — Encounter
Admission: RE | Disposition: A | Discharge status: Home / Self Care | Payer: Self-pay | Source: Ambulatory Visit | Attending: Unknown Physician Specialty

## 2018-04-25 DIAGNOSIS — E785 Hyperlipidemia, unspecified: Secondary | ICD-10-CM | POA: Diagnosis not present

## 2018-04-25 DIAGNOSIS — Z8371 Family history of colonic polyps: Secondary | ICD-10-CM | POA: Diagnosis not present

## 2018-04-25 DIAGNOSIS — K219 Gastro-esophageal reflux disease without esophagitis: Secondary | ICD-10-CM | POA: Diagnosis not present

## 2018-04-25 DIAGNOSIS — K760 Fatty (change of) liver, not elsewhere classified: Secondary | ICD-10-CM | POA: Insufficient documentation

## 2018-04-25 DIAGNOSIS — K6389 Other specified diseases of intestine: Secondary | ICD-10-CM | POA: Insufficient documentation

## 2018-04-25 DIAGNOSIS — E559 Vitamin D deficiency, unspecified: Secondary | ICD-10-CM | POA: Diagnosis not present

## 2018-04-25 DIAGNOSIS — K2940 Chronic atrophic gastritis without bleeding: Secondary | ICD-10-CM | POA: Diagnosis not present

## 2018-04-25 DIAGNOSIS — K648 Other hemorrhoids: Secondary | ICD-10-CM | POA: Diagnosis not present

## 2018-04-25 DIAGNOSIS — R1011 Right upper quadrant pain: Secondary | ICD-10-CM | POA: Diagnosis present

## 2018-04-25 DIAGNOSIS — D122 Benign neoplasm of ascending colon: Secondary | ICD-10-CM | POA: Diagnosis not present

## 2018-04-25 DIAGNOSIS — K5790 Diverticulosis of intestine, part unspecified, without perforation or abscess without bleeding: Secondary | ICD-10-CM | POA: Diagnosis not present

## 2018-04-25 DIAGNOSIS — K635 Polyp of colon: Secondary | ICD-10-CM | POA: Diagnosis not present

## 2018-04-25 DIAGNOSIS — K3189 Other diseases of stomach and duodenum: Secondary | ICD-10-CM | POA: Diagnosis not present

## 2018-04-25 DIAGNOSIS — E039 Hypothyroidism, unspecified: Secondary | ICD-10-CM | POA: Insufficient documentation

## 2018-04-25 DIAGNOSIS — D125 Benign neoplasm of sigmoid colon: Secondary | ICD-10-CM | POA: Insufficient documentation

## 2018-04-25 DIAGNOSIS — Z87891 Personal history of nicotine dependence: Secondary | ICD-10-CM | POA: Diagnosis not present

## 2018-04-25 DIAGNOSIS — K2970 Gastritis, unspecified, without bleeding: Secondary | ICD-10-CM | POA: Diagnosis not present

## 2018-04-25 DIAGNOSIS — Z79899 Other long term (current) drug therapy: Secondary | ICD-10-CM | POA: Insufficient documentation

## 2018-04-25 DIAGNOSIS — Z1211 Encounter for screening for malignant neoplasm of colon: Secondary | ICD-10-CM | POA: Diagnosis not present

## 2018-04-25 DIAGNOSIS — D126 Benign neoplasm of colon, unspecified: Secondary | ICD-10-CM | POA: Diagnosis not present

## 2018-04-25 DIAGNOSIS — K5730 Diverticulosis of large intestine without perforation or abscess without bleeding: Secondary | ICD-10-CM | POA: Diagnosis not present

## 2018-04-25 DIAGNOSIS — K2960 Other gastritis without bleeding: Secondary | ICD-10-CM | POA: Diagnosis not present

## 2018-04-25 HISTORY — PX: ESOPHAGOGASTRODUODENOSCOPY (EGD) WITH PROPOFOL: SHX5813

## 2018-04-25 HISTORY — DX: Vitamin D deficiency, unspecified: E55.9

## 2018-04-25 HISTORY — DX: Epigastric pain: R10.13

## 2018-04-25 HISTORY — DX: Polyp of colon: K63.5

## 2018-04-25 HISTORY — DX: Fatty (change of) liver, not elsewhere classified: K76.0

## 2018-04-25 HISTORY — DX: Hypothyroidism, unspecified: E03.9

## 2018-04-25 HISTORY — DX: Gastro-esophageal reflux disease without esophagitis: K21.9

## 2018-04-25 HISTORY — DX: Thyrotoxicosis with diffuse goiter without thyrotoxic crisis or storm: E05.00

## 2018-04-25 HISTORY — PX: COLONOSCOPY WITH PROPOFOL: SHX5780

## 2018-04-25 HISTORY — DX: Deficiency of other specified B group vitamins: E53.8

## 2018-04-25 SURGERY — COLONOSCOPY WITH PROPOFOL
Anesthesia: General

## 2018-04-25 MED ORDER — MIDAZOLAM HCL 2 MG/2ML IJ SOLN
INTRAMUSCULAR | Status: AC
  Filled 2018-04-25: qty 2

## 2018-04-25 MED ORDER — PROPOFOL 500 MG/50ML IV EMUL
INTRAVENOUS | Status: AC
  Filled 2018-04-25: qty 50

## 2018-04-25 MED ORDER — SODIUM CHLORIDE 0.9 % IV SOLN: INTRAVENOUS | Status: DC

## 2018-04-25 MED ORDER — PROPOFOL 500 MG/50ML IV EMUL
INTRAVENOUS | Status: DC | PRN
  Administered 2018-04-25: 50 ug/kg/min via INTRAVENOUS

## 2018-04-25 MED ORDER — LIDOCAINE HCL (PF) 2 % IJ SOLN
INTRAMUSCULAR | Status: DC | PRN
  Administered 2018-04-25: 60 mg

## 2018-04-25 MED ORDER — MIDAZOLAM HCL 5 MG/5ML IJ SOLN
INTRAMUSCULAR | Status: DC | PRN
  Administered 2018-04-25: 2 mg via INTRAVENOUS

## 2018-04-25 MED ORDER — PROPOFOL 10 MG/ML IV BOLUS
INTRAVENOUS | Status: DC | PRN
  Administered 2018-04-25: 30 mg via INTRAVENOUS
  Administered 2018-04-25: 10 mg via INTRAVENOUS

## 2018-04-25 MED ORDER — PHENYLEPHRINE HCL 10 MG/ML IJ SOLN
INTRAMUSCULAR | Status: DC | PRN
  Administered 2018-04-25 (×4): 100 ug via INTRAVENOUS

## 2018-04-25 MED ORDER — EPHEDRINE SULFATE 50 MG/ML IJ SOLN
INTRAMUSCULAR | Status: DC | PRN
  Administered 2018-04-25: 10 mg via INTRAVENOUS
  Administered 2018-04-25: 15 mg via INTRAVENOUS

## 2018-04-25 MED ORDER — FENTANYL CITRATE (PF) 100 MCG/2ML IJ SOLN
INTRAMUSCULAR | Status: AC
  Filled 2018-04-25: qty 2

## 2018-04-25 MED ORDER — GLYCOPYRROLATE 0.2 MG/ML IJ SOLN
INTRAMUSCULAR | Status: DC | PRN
  Administered 2018-04-25: 0.2 mg via INTRAVENOUS

## 2018-04-25 MED ORDER — FENTANYL CITRATE (PF) 100 MCG/2ML IJ SOLN
INTRAMUSCULAR | Status: DC | PRN
  Administered 2018-04-25 (×2): 50 ug via INTRAVENOUS

## 2018-04-25 MED ORDER — SODIUM CHLORIDE 0.9 % IV SOLN
INTRAVENOUS | Status: DC
  Administered 2018-04-25: 11:00:00 via INTRAVENOUS

## 2018-04-25 NOTE — Anesthesia Post-op Follow-up Note (Signed)
Anesthesia QCDR form completed.        

## 2018-04-25 NOTE — Op Note (Signed)
[facility] Gastroenterology Patient Name: Angela Bell Procedure Date: 04/25/2018 10:37 AM MRN: [id number] Account #: [id number] Date of Birth: 20-Sep-1946 Admit Type: Outpatient Age: 72 Room: [facility] ENDO ROOM 3 Gender: Female Note Status: Finalized Procedure:            Upper GI endoscopy Indications:          Abdominal pain in the right upper quadrant Providers:            Manya Silvas, MD Referring MD:         Wynelle Fanny. Tower (Referring MD), Legrand Como Altheimer MD                        (Referring MD) Medicines:            Propofol per Anesthesia Complications:        No immediate complications. Procedure:            Pre-Anesthesia Assessment:                       - After reviewing the risks and benefits, the patient                        was deemed in satisfactory condition to undergo the                        procedure.                       After obtaining informed consent, the endoscope was                        passed under direct vision. Throughout the procedure,                        the patient's blood pressure, pulse, and oxygen                        saturations were monitored continuously. The Endoscope                        was introduced through the mouth, and advanced to the                        second part of duodenum. The upper GI endoscopy was                        accomplished without difficulty. The patient tolerated                        the procedure well. Findings:      The examined esophagus was normal. GEJ 40cm.      Diffuse mildly erythematous mucosa without bleeding was found in the       gastric body and in the gastric antrum. Biopsies were taken with a cold       forceps for histology. Biopsies were taken with a cold forceps for       Helicobacter pylori testing. Also prepyloric area.      The examined duodenum was normal. Impression:           - Normal esophagus.                       -  Erythematous mucosa in the  gastric body and antrum.                        Biopsied.                       - Normal examined duodenum. Recommendation:       - Await pathology results. Manya Silvas, MD 04/25/2018 10:56:41 AM This report has been signed electronically. Number of Addenda: 0 Note Initiated On: 04/25/2018 10:37 AM      [facility]

## 2018-04-25 NOTE — Anesthesia Postprocedure Evaluation (Signed)
Anesthesia Post Note  Patient: FELISSA BLOUCH  Procedure(s) Performed: COLONOSCOPY WITH PROPOFOL (N/A ) ESOPHAGOGASTRODUODENOSCOPY (EGD) WITH PROPOFOL (N/A )  Patient location during evaluation: PACU Anesthesia Type: General Level of consciousness: awake and alert Pain management: pain level controlled Vital Signs Assessment: post-procedure vital signs reviewed and stable Respiratory status: spontaneous breathing, nonlabored ventilation, respiratory function stable and patient connected to nasal cannula oxygen Cardiovascular status: blood pressure returned to baseline and stable Postop Assessment: no apparent nausea or vomiting Anesthetic complications: no     Last Vitals:  Vitals:   04/25/18 1016 04/25/18 1127  BP: 104/75 (!) 101/59  Pulse: 79 95  Resp: 16 18  Temp: (!) 35.9 C (!) 35.7 C  SpO2: 100% 98%    Last Pain:  Vitals:   04/25/18 1147  TempSrc:   PainSc: 0-No pain                 Molli Barrows

## 2018-04-25 NOTE — H&P (Signed)
Primary Care Physician:  Tower, Wynelle Fanny, MD Primary Gastroenterologist:  Dr. Vira Agar  Pre-Procedure History & Physical: HPI:  Angela Bell is a 72 y.o. female is here for an endoscopy and colonoscopy.  Done for RUQ abd pain and FH colon polyps   Past Medical History:  Diagnosis Date  . Allergy    allergic rhinitis  . B12 deficiency   . Back pain    disc disease lumbar  . Colon polyp   . Dyspepsia   . Fatty liver   . GERD (gastroesophageal reflux disease)   . Graves' disease   . Hyperlipidemia   . Hyperthyroidism    Grave's disease (tx with radioactive I)  . Hypothyroidism   . Osteopenia   . Vitamin D deficiency   . Yeast vaginitis    recurrent- keeps terazol on hand / otc does not work    Past Surgical History:  Procedure Laterality Date  . BREAST SURGERY     breast reduction  . CARPAL TUNNEL RELEASE    . CATARACT EXTRACTION, BILATERAL    . CESAREAN SECTION     x 2  . chin implant    . COLONOSCOPY    . EYE SURGERY     x3 for strabismus  . REDUCTION MAMMAPLASTY      Prior to Admission medications   Medication Sig Start Date End Date Taking? Authorizing Provider  Calcium Carbonate-Vit D-Min 600-400 MG-UNIT TABS Take 2 tablets by mouth daily.     Yes [provider]  Cholecalciferol (VITAMIN D3) 5000 UNITS CAPS Take 1 capsule by mouth daily.     Yes [provider]  Docosahexaenoic Acid 100 MG CAPS Take by mouth.   Yes [provider]  fexofenadine-pseudoephedrine (ALLEGRA-D 12 HOUR) 60-120 MG per tablet Take 1 tablet by mouth 2 (two) times daily as needed. 05/03/13  Yes Tower, Wynelle Fanny, MD  levothyroxine (SYNTHROID) 112 MCG tablet Take 1 tablet by mouth every morning. 11/05/17  Yes [provider]  Multiple Vitamin (MULTIVITAMIN) tablet Take 2 times a week   Yes [provider]    Allergies as of 03/15/2018 - Review Complete 12/03/2017  Allergen Reaction Noted  . Boniva [ibandronic acid]  12/03/2017    Family  History  Problem Relation Age of Onset  . Osteoporosis Mother   . Heart disease Mother        mild heart problem  . Breast cancer Cousin   . Arthritis Father        RA    Social History   Socioeconomic History  . Marital status: Divorced    Spouse name: Not on file  . Number of children: Not on file  . Years of education: Not on file  . Highest education level: Not on file  Occupational History  . Not on file  Social Needs  . Financial resource strain: Not on file  . Food insecurity:    Worry: Not on file    Inability: Not on file  . Transportation needs:    Medical: Not on file    Non-medical: Not on file  Tobacco Use  . Smoking status: Former Smoker    Last attempt to quit: 11/23/1981    Years since quitting: 36.4  . Smokeless tobacco: Never Used  Substance and Sexual Activity  . Alcohol use: Yes    Alcohol/week: 0.0 oz    Comment: occassionally  . Drug use: No  . Sexual activity: Not on file  Lifestyle  . Physical  activity:    Days per week: Not on file    Minutes per session: Not on file  . Stress: Not on file  Relationships  . Social connections:    Talks on phone: Not on file    Gets together: Not on file    Attends religious service: Not on file    Active member of club or organization: Not on file    Attends meetings of clubs or organizations: Not on file    Relationship status: Not on file  . Intimate partner violence:    Fear of current or ex partner: Not on file    Emotionally abused: Not on file    Physically abused: Not on file    Forced sexual activity: Not on file  Other Topics Concern  . Not on file  Social History Narrative  . Not on file    Review of Systems: See HPI, otherwise negative ROS  Physical Exam: BP 104/75   Pulse 79   Temp (!) 96.6 F (35.9 C) (Tympanic)   Resp 16   Ht 5\' 4"  (1.626 m)   Wt 61.2 kg (135 lb)   SpO2 100%   BMI 23.17 kg/m  General:   Alert,  pleasant and cooperative in NAD Head:  Normocephalic and  atraumatic. Neck:  Supple; no masses or thyromegaly. Lungs:  Clear throughout to auscultation.    Heart:  Regular rate and rhythm. Abdomen:  Soft, nontender and nondistended. Normal bowel sounds, without guarding, and without rebound.   Neurologic:  Alert and  oriented x4;  grossly normal neurologically.  Impression/Plan: Angela Bell is here for an endoscopy and colonoscopy to be performed for FH colon polyps and RUQ abd pain.  Risks, benefits, limitations, and alternatives regarding  endoscopy and colonoscopy have been reviewed with the patient.  Questions have been answered.  All parties agreeable.   Gaylyn Cheers, MD  04/25/2018, 10:40 AM

## 2018-04-25 NOTE — Transfer of Care (Signed)
Immediate Anesthesia Transfer of Care Note  Patient: Angela Bell  Procedure(s) Performed: COLONOSCOPY WITH PROPOFOL (N/A ) ESOPHAGOGASTRODUODENOSCOPY (EGD) WITH PROPOFOL (N/A )  Patient Location: PACU  Anesthesia Type:General  Level of Consciousness: sedated  Airway & Oxygen Therapy: Patient Spontanous Breathing and Patient connected to nasal cannula oxygen  Post-op Assessment: Report given to RN and Post -op Vital signs reviewed and stable  Post vital signs: Reviewed and stable  Last Vitals:  Vitals Value Taken Time  BP    Temp    Pulse    Resp    SpO2      Last Pain:  Vitals:   04/25/18 1016  TempSrc: Tympanic  PainSc: 0-No pain         Complications: No apparent anesthesia complications

## 2018-04-25 NOTE — Anesthesia Preprocedure Evaluation (Signed)
Anesthesia Evaluation  Patient identified by MRN, date of birth, ID band Patient awake    Reviewed: Allergy & Precautions, H&P , NPO status , Patient's Chart, lab work & pertinent test results, reviewed documented beta blocker date and time   Airway Mallampati: II   Neck ROM: full    Dental  (+) Poor Dentition, Teeth Intact   Pulmonary neg pulmonary ROS, former smoker,    Pulmonary exam normal        Cardiovascular Exercise Tolerance: Good negative cardio ROS Normal cardiovascular exam Rhythm:regular Rate:Normal     Neuro/Psych negative neurological ROS  negative psych ROS   GI/Hepatic negative GI ROS, Neg liver ROS, GERD  ,  Endo/Other  negative endocrine ROSHypothyroidism   Renal/GU negative Renal ROS  negative genitourinary   Musculoskeletal   Abdominal   Peds  Hematology negative hematology ROS (+)   Anesthesia Other Findings Past Medical History: No date: Allergy     Comment:  allergic rhinitis No date: B12 deficiency No date: Back pain     Comment:  disc disease lumbar No date: Colon polyp No date: Dyspepsia No date: Fatty liver No date: GERD (gastroesophageal reflux disease) No date: Graves' disease No date: Hyperlipidemia No date: Hyperthyroidism     Comment:  Grave's disease (tx with radioactive I) No date: Hypothyroidism No date: Osteopenia No date: Vitamin D deficiency No date: Yeast vaginitis     Comment:  recurrent- keeps terazol on hand / otc does not work Past Surgical History: No date: BREAST SURGERY     Comment:  breast reduction No date: CARPAL TUNNEL RELEASE No date: CATARACT EXTRACTION, BILATERAL No date: CESAREAN SECTION     Comment:  x 2 No date: chin implant No date: COLONOSCOPY No date: EYE SURGERY     Comment:  x3 for strabismus No date: REDUCTION MAMMAPLASTY BMI    Body Mass Index:  23.17 kg/m     Reproductive/Obstetrics negative OB ROS                              Anesthesia Physical Anesthesia Plan  ASA: III  Anesthesia Plan: General   Post-op Pain Management:    Induction:   PONV Risk Score and Plan:   Airway Management Planned:   Additional Equipment:   Intra-op Plan:   Post-operative Plan:   Informed Consent: I have reviewed the patients History and Physical, chart, labs and discussed the procedure including the risks, benefits and alternatives for the proposed anesthesia with the patient or authorized representative who has indicated his/her understanding and acceptance.   Dental Advisory Given  Plan Discussed with: CRNA  Anesthesia Plan Comments:         Anesthesia Quick Evaluation

## 2018-04-25 NOTE — Op Note (Signed)
[facility] Patient Name: Angela Bell Procedure Date: 04/25/2018 10:37 AM MRN: [id number] Account #: [id number] Date of Birth: [birth year] Admit Type: Outpatient Age: 72 Room: [facility] ENDO ROOM 3 Gender: Female Note Status: Finalized Procedure:            Colonoscopy Indications:          Family history of colonic polyps in a first-degree                        relative Providers:            Manya Silvas, MD Referring MD:         Wynelle Fanny. Tower (Referring MD), Legrand Como Altheimer MD                        (Referring MD) Complications:        No immediate complications. Procedure:            Pre-Anesthesia Assessment:                       - After reviewing the risks and benefits, the patient                        was deemed in satisfactory condition to undergo the                        procedure.                       After obtaining informed consent, the colonoscope was                        passed under direct vision. Throughout the procedure,                        the patient's blood pressure, pulse, and oxygen                        saturations were monitored continuously. The                        Colonoscope was introduced through the anus and                        advanced to the the cecum, identified by appendiceal                        orifice and ileocecal valve. The colonoscopy was                        somewhat difficult due to a tortuous colon. Successful                        completion of the procedure was aided by applying                        abdominal pressure. The patient tolerated the procedure                        well. The quality of the bowel preparation was  excellent. Findings:      Three sessile polyps were found in the ascending colon. The polyps were       diminutive in size. These polyps were removed with a hot snare.       Resection was complete, but the polyp tissue was only  partially       retrieved. Only one was retrieved.      A diminutive polyp was found in the sigmoid colon. The polyp was       sessile. The polyp was removed with a jumbo cold forceps. Resection and       retrieval were complete.      A few medium-mouthed diverticula were found in the sigmoid colon and       descending colon.      Internal hemorrhoids were found during endoscopy. The hemorrhoids were       small and medium-sized. Impression:           - Three diminutive polyps in the ascending colon,                        removed with a hot snare. Complete resection. Partial                        retrieval.                       - One diminutive polyp in the sigmoid colon, removed                        with a jumbo cold forceps. Resected and retrieved.                       - Diverticulosis in the sigmoid colon and in the                        descending colon.                       - Internal hemorrhoids. Recommendation:       - Await pathology results. Manya Silvas, MD 04/25/2018 11:31:12 AM This report has been signed electronically. Number of Addenda: 0 Note Initiated On: 04/25/2018 10:37 AM Scope Withdrawal Time: 0 hours 13 minutes 20 seconds  Total Procedure Duration: 0 hours 23 minutes 47 seconds       [facility]

## 2018-04-26 LAB — SURGICAL PATHOLOGY

## 2018-04-28 ENCOUNTER — Encounter: Payer: Self-pay | Admitting: Unknown Physician Specialty

## 2018-04-28 DIAGNOSIS — J301 Allergic rhinitis due to pollen: Secondary | ICD-10-CM | POA: Diagnosis not present

## 2018-05-05 DIAGNOSIS — J309 Allergic rhinitis, unspecified: Secondary | ICD-10-CM | POA: Diagnosis not present

## 2018-05-12 DIAGNOSIS — J301 Allergic rhinitis due to pollen: Secondary | ICD-10-CM | POA: Diagnosis not present

## 2018-05-19 DIAGNOSIS — J301 Allergic rhinitis due to pollen: Secondary | ICD-10-CM | POA: Diagnosis not present

## 2018-05-25 DIAGNOSIS — J301 Allergic rhinitis due to pollen: Secondary | ICD-10-CM | POA: Diagnosis not present

## 2018-06-02 DIAGNOSIS — J301 Allergic rhinitis due to pollen: Secondary | ICD-10-CM | POA: Diagnosis not present

## 2018-06-09 DIAGNOSIS — J301 Allergic rhinitis due to pollen: Secondary | ICD-10-CM | POA: Diagnosis not present

## 2018-06-16 DIAGNOSIS — J301 Allergic rhinitis due to pollen: Secondary | ICD-10-CM | POA: Diagnosis not present

## 2018-06-16 DIAGNOSIS — M545 Low back pain: Secondary | ICD-10-CM | POA: Diagnosis not present

## 2018-06-23 DIAGNOSIS — J301 Allergic rhinitis due to pollen: Secondary | ICD-10-CM | POA: Diagnosis not present

## 2018-06-29 DIAGNOSIS — J301 Allergic rhinitis due to pollen: Secondary | ICD-10-CM | POA: Diagnosis not present

## 2018-06-30 DIAGNOSIS — J301 Allergic rhinitis due to pollen: Secondary | ICD-10-CM | POA: Diagnosis not present

## 2018-07-07 DIAGNOSIS — J301 Allergic rhinitis due to pollen: Secondary | ICD-10-CM | POA: Diagnosis not present

## 2018-07-14 DIAGNOSIS — J301 Allergic rhinitis due to pollen: Secondary | ICD-10-CM | POA: Diagnosis not present

## 2018-07-21 DIAGNOSIS — J301 Allergic rhinitis due to pollen: Secondary | ICD-10-CM | POA: Diagnosis not present

## 2018-07-21 DIAGNOSIS — Z23 Encounter for immunization: Secondary | ICD-10-CM | POA: Diagnosis not present

## 2018-08-04 DIAGNOSIS — J301 Allergic rhinitis due to pollen: Secondary | ICD-10-CM | POA: Diagnosis not present

## 2018-08-11 DIAGNOSIS — J301 Allergic rhinitis due to pollen: Secondary | ICD-10-CM | POA: Diagnosis not present

## 2018-08-15 DIAGNOSIS — R748 Abnormal levels of other serum enzymes: Secondary | ICD-10-CM | POA: Diagnosis not present

## 2018-08-15 DIAGNOSIS — R1011 Right upper quadrant pain: Secondary | ICD-10-CM | POA: Diagnosis not present

## 2018-08-15 DIAGNOSIS — K645 Perianal venous thrombosis: Secondary | ICD-10-CM | POA: Diagnosis not present

## 2018-08-15 DIAGNOSIS — K2930 Chronic superficial gastritis without bleeding: Secondary | ICD-10-CM | POA: Diagnosis not present

## 2018-08-15 DIAGNOSIS — K760 Fatty (change of) liver, not elsewhere classified: Secondary | ICD-10-CM | POA: Diagnosis not present

## 2018-08-16 ENCOUNTER — Other Ambulatory Visit: Payer: Self-pay | Admitting: Nurse Practitioner

## 2018-08-16 DIAGNOSIS — K645 Perianal venous thrombosis: Secondary | ICD-10-CM | POA: Diagnosis not present

## 2018-08-16 DIAGNOSIS — R1011 Right upper quadrant pain: Secondary | ICD-10-CM

## 2018-08-16 DIAGNOSIS — K760 Fatty (change of) liver, not elsewhere classified: Secondary | ICD-10-CM | POA: Diagnosis not present

## 2018-08-16 DIAGNOSIS — R748 Abnormal levels of other serum enzymes: Secondary | ICD-10-CM | POA: Diagnosis not present

## 2018-08-16 DIAGNOSIS — K2930 Chronic superficial gastritis without bleeding: Secondary | ICD-10-CM | POA: Diagnosis not present

## 2018-08-18 DIAGNOSIS — J301 Allergic rhinitis due to pollen: Secondary | ICD-10-CM | POA: Diagnosis not present

## 2018-08-19 ENCOUNTER — Other Ambulatory Visit: Payer: Self-pay | Admitting: Nurse Practitioner

## 2018-08-19 DIAGNOSIS — K760 Fatty (change of) liver, not elsewhere classified: Secondary | ICD-10-CM

## 2018-08-19 DIAGNOSIS — R1011 Right upper quadrant pain: Secondary | ICD-10-CM

## 2018-08-25 DIAGNOSIS — J301 Allergic rhinitis due to pollen: Secondary | ICD-10-CM | POA: Diagnosis not present

## 2018-09-01 ENCOUNTER — Ambulatory Visit: Payer: PPO

## 2018-09-01 ENCOUNTER — Ambulatory Visit
Admission: RE | Admit: 2018-09-01 | Discharge: 2018-09-01 | Disposition: A | Discharge status: Home / Self Care | Payer: PPO | Source: Ambulatory Visit | Attending: Nurse Practitioner | Admitting: Nurse Practitioner

## 2018-09-01 DIAGNOSIS — K7689 Other specified diseases of liver: Secondary | ICD-10-CM | POA: Insufficient documentation

## 2018-09-01 DIAGNOSIS — E785 Hyperlipidemia, unspecified: Secondary | ICD-10-CM | POA: Diagnosis not present

## 2018-09-01 DIAGNOSIS — K760 Fatty (change of) liver, not elsewhere classified: Secondary | ICD-10-CM | POA: Insufficient documentation

## 2018-09-01 DIAGNOSIS — J301 Allergic rhinitis due to pollen: Secondary | ICD-10-CM | POA: Diagnosis not present

## 2018-09-01 DIAGNOSIS — R1011 Right upper quadrant pain: Secondary | ICD-10-CM

## 2018-09-01 DIAGNOSIS — I700 Atherosclerosis of aorta: Secondary | ICD-10-CM | POA: Insufficient documentation

## 2018-09-08 DIAGNOSIS — J301 Allergic rhinitis due to pollen: Secondary | ICD-10-CM | POA: Diagnosis not present

## 2018-09-12 DIAGNOSIS — Z23 Encounter for immunization: Secondary | ICD-10-CM | POA: Diagnosis not present

## 2018-09-13 DIAGNOSIS — E890 Postprocedural hypothyroidism: Secondary | ICD-10-CM | POA: Diagnosis not present

## 2018-09-15 DIAGNOSIS — J301 Allergic rhinitis due to pollen: Secondary | ICD-10-CM | POA: Diagnosis not present

## 2018-09-20 DIAGNOSIS — E890 Postprocedural hypothyroidism: Secondary | ICD-10-CM | POA: Diagnosis not present

## 2018-09-20 DIAGNOSIS — E559 Vitamin D deficiency, unspecified: Secondary | ICD-10-CM | POA: Diagnosis not present

## 2018-09-20 DIAGNOSIS — Z23 Encounter for immunization: Secondary | ICD-10-CM | POA: Diagnosis not present

## 2018-09-20 DIAGNOSIS — M8580 Other specified disorders of bone density and structure, unspecified site: Secondary | ICD-10-CM | POA: Diagnosis not present

## 2018-09-20 DIAGNOSIS — Z8639 Personal history of other endocrine, nutritional and metabolic disease: Secondary | ICD-10-CM | POA: Diagnosis not present

## 2018-09-23 DIAGNOSIS — J301 Allergic rhinitis due to pollen: Secondary | ICD-10-CM | POA: Diagnosis not present

## 2018-09-29 DIAGNOSIS — J301 Allergic rhinitis due to pollen: Secondary | ICD-10-CM | POA: Diagnosis not present

## 2018-10-06 DIAGNOSIS — J301 Allergic rhinitis due to pollen: Secondary | ICD-10-CM | POA: Diagnosis not present

## 2018-10-13 DIAGNOSIS — J301 Allergic rhinitis due to pollen: Secondary | ICD-10-CM | POA: Diagnosis not present

## 2018-10-27 DIAGNOSIS — J301 Allergic rhinitis due to pollen: Secondary | ICD-10-CM | POA: Diagnosis not present

## 2018-11-03 DIAGNOSIS — J301 Allergic rhinitis due to pollen: Secondary | ICD-10-CM | POA: Diagnosis not present

## 2018-11-08 DIAGNOSIS — M5136 Other intervertebral disc degeneration, lumbar region: Secondary | ICD-10-CM | POA: Diagnosis not present

## 2018-11-10 DIAGNOSIS — J301 Allergic rhinitis due to pollen: Secondary | ICD-10-CM | POA: Diagnosis not present

## 2018-11-17 DIAGNOSIS — J301 Allergic rhinitis due to pollen: Secondary | ICD-10-CM | POA: Diagnosis not present

## 2018-11-24 DIAGNOSIS — J301 Allergic rhinitis due to pollen: Secondary | ICD-10-CM | POA: Diagnosis not present

## 2018-12-01 DIAGNOSIS — J301 Allergic rhinitis due to pollen: Secondary | ICD-10-CM | POA: Diagnosis not present

## 2018-12-05 DIAGNOSIS — K2930 Chronic superficial gastritis without bleeding: Secondary | ICD-10-CM | POA: Diagnosis not present

## 2018-12-05 DIAGNOSIS — K760 Fatty (change of) liver, not elsewhere classified: Secondary | ICD-10-CM | POA: Diagnosis not present

## 2018-12-08 DIAGNOSIS — J301 Allergic rhinitis due to pollen: Secondary | ICD-10-CM | POA: Diagnosis not present

## 2018-12-15 DIAGNOSIS — J301 Allergic rhinitis due to pollen: Secondary | ICD-10-CM | POA: Diagnosis not present

## 2018-12-16 DIAGNOSIS — J301 Allergic rhinitis due to pollen: Secondary | ICD-10-CM | POA: Diagnosis not present

## 2018-12-18 ENCOUNTER — Telehealth: Payer: Self-pay | Admitting: Family Medicine

## 2018-12-18 DIAGNOSIS — E559 Vitamin D deficiency, unspecified: Secondary | ICD-10-CM

## 2018-12-18 DIAGNOSIS — Z0000 Encounter for general adult medical examination without abnormal findings: Secondary | ICD-10-CM

## 2018-12-18 DIAGNOSIS — E890 Postprocedural hypothyroidism: Secondary | ICD-10-CM

## 2018-12-18 DIAGNOSIS — E538 Deficiency of other specified B group vitamins: Secondary | ICD-10-CM

## 2018-12-18 NOTE — Telephone Encounter (Signed)
-----   Message from Eustace Pen, LPN sent at 4/45/8483  2:56 PM EST ----- Regarding: Labs 1/27 Lab orders needed. Thank you.

## 2018-12-19 ENCOUNTER — Ambulatory Visit: Payer: PPO

## 2018-12-19 ENCOUNTER — Ambulatory Visit (INDEPENDENT_AMBULATORY_CARE_PROVIDER_SITE_OTHER): Payer: PPO

## 2018-12-19 VITALS — BP 102/64 | HR 84 | Temp 98.3°F | Ht 65.25 in | Wt 140.5 lb

## 2018-12-19 DIAGNOSIS — E538 Deficiency of other specified B group vitamins: Secondary | ICD-10-CM | POA: Diagnosis not present

## 2018-12-19 DIAGNOSIS — Z0000 Encounter for general adult medical examination without abnormal findings: Secondary | ICD-10-CM | POA: Diagnosis not present

## 2018-12-19 DIAGNOSIS — E890 Postprocedural hypothyroidism: Secondary | ICD-10-CM | POA: Diagnosis not present

## 2018-12-19 DIAGNOSIS — E559 Vitamin D deficiency, unspecified: Secondary | ICD-10-CM

## 2018-12-19 LAB — COMPREHENSIVE METABOLIC PANEL
ALBUMIN: 4.1 g/dL (ref 3.5–5.2)
ALK PHOS: 121 U/L — AB (ref 39–117)
ALT: 11 U/L (ref 0–35)
AST: 15 U/L (ref 0–37)
BILIRUBIN TOTAL: 0.7 mg/dL (ref 0.2–1.2)
BUN: 13 mg/dL (ref 6–23)
CALCIUM: 9.5 mg/dL (ref 8.4–10.5)
CO2: 27 mEq/L (ref 19–32)
Chloride: 105 mEq/L (ref 96–112)
Creatinine, Ser: 0.73 mg/dL (ref 0.40–1.20)
GFR: 78.74 mL/min (ref >60.00)
GLUCOSE: 91 mg/dL (ref 70–99)
Potassium: 3.9 mEq/L (ref 3.5–5.1)
Sodium: 139 mEq/L (ref 135–145)
TOTAL PROTEIN: 6.9 g/dL (ref 6.0–8.3)

## 2018-12-19 LAB — CBC WITH DIFFERENTIAL/PLATELET
BASOS ABS: 0 10*3/uL (ref 0.0–0.1)
Basophils Relative: 0.9 % (ref 0.0–3.0)
EOS PCT: 1.9 % (ref 0.0–5.0)
Eosinophils Absolute: 0.1 10*3/uL (ref 0.0–0.7)
HEMATOCRIT: 40.2 % (ref 36.0–46.0)
HEMOGLOBIN: 13.4 g/dL (ref 12.0–15.0)
LYMPHS ABS: 1.2 10*3/uL (ref 0.7–4.0)
LYMPHS PCT: 29.5 % (ref 12.0–46.0)
MCHC: 33.3 g/dL (ref 30.0–36.0)
MCV: 93.6 fl (ref 78.0–100.0)
MONOS PCT: 9.3 % (ref 3.0–12.0)
Monocytes Absolute: 0.4 10*3/uL (ref 0.1–1.0)
Neutro Abs: 2.4 10*3/uL (ref 1.4–7.7)
Neutrophils Relative %: 58.4 % (ref 43.0–77.0)
Platelets: 241 10*3/uL (ref 150.0–400.0)
RBC: 4.3 Mil/uL (ref 3.87–5.11)
RDW: 13.3 % (ref 11.5–15.5)
WBC: 4.2 10*3/uL (ref 4.0–10.5)

## 2018-12-19 LAB — LIPID PANEL
CHOL/HDL RATIO: 3
Cholesterol: 201 mg/dL — ABNORMAL HIGH (ref 0–200)
HDL: 65.6 mg/dL (ref >39.00)
LDL Cholesterol: 120 mg/dL — ABNORMAL HIGH (ref 0–99)
NONHDL: 135.35
Triglycerides: 75 mg/dL (ref 0.0–149.0)
VLDL: 15 mg/dL (ref 0.0–40.0)

## 2018-12-19 LAB — VITAMIN D 25 HYDROXY (VIT D DEFICIENCY, FRACTURES): VITD: 73.68 ng/mL (ref 30.00–100.00)

## 2018-12-19 LAB — VITAMIN B12: VITAMIN B 12: 380 pg/mL (ref 211–911)

## 2018-12-19 LAB — TSH: TSH: 0.14 u[IU]/mL — AB (ref 0.35–4.50)

## 2018-12-19 NOTE — Patient Instructions (Addendum)
Angela Bell , Thank you for taking time to come for your Medicare Wellness Visit. I appreciate your ongoing commitment to your health goals. Please review the following plan we discussed and let me know if I can assist you in the future.   These are the goals we discussed: Goals    . Increase physical activity     Starting 12/19/18, I will continue to exercise for 30 minutes 3 days per week.        This is a list of the screening recommended for you and due dates:  Health Maintenance  Topic Date Due  . Mammogram  11/23/2019*  . Tetanus Vaccine  11/27/2020  . Colon Cancer Screening  04/25/2028  . Flu Shot  Completed  . DEXA scan (bone density measurement)  Completed  .  Hepatitis C: One time screening is recommended by Center for Disease Control  (CDC) for  adults born from [birth year] through 1963.   Completed  . Pneumonia vaccines  Completed  *Topic was postponed. The date shown is not the original due date.   Preventive Care for Adults  A healthy lifestyle and preventive care can promote health and wellness. Preventive health guidelines for adults include the following key practices.  . A routine yearly physical is a good way to check with your health care provider about your health and preventive screening. It is a chance to share any concerns and updates on your health and to receive a thorough exam.  . Visit your dentist for a routine exam and preventive care every 6 months. Brush your teeth twice a day and floss once a day. Good oral hygiene prevents tooth decay and gum disease.  . The frequency of eye exams is based on your age, health, family medical history, use  of contact lenses, and other factors. Follow your health care provider's recommendations for frequency of eye exams.  . Eat a healthy diet. Foods like vegetables, fruits, whole grains, low-fat dairy products, and lean protein foods contain the nutrients you need without too many calories. Decrease your intake of foods  high in solid fats, added sugars, and salt. Eat the right amount of calories for you. Get information about a proper diet from your health care provider, if necessary.  . Regular physical exercise is one of the most important things you can do for your health. Most adults should get at least 150 minutes of moderate-intensity exercise (any activity that increases your heart rate and causes you to sweat) each week. In addition, most adults need muscle-strengthening exercises on 2 or more days a week.  Silver Sneakers may be a benefit available to you. To determine eligibility, you may visit the website: www.silversneakers.com or contact program at (564)747-1060 Mon-Fri between 8AM-8PM.   . Maintain a healthy weight. The body mass index (BMI) is a screening tool to identify possible weight problems. It provides an estimate of body fat based on height and weight. Your health care provider can find your BMI and can help you achieve or maintain a healthy weight.   For adults 20 years and older: ? A BMI below 18.5 is considered underweight. ? A BMI of 18.5 to 24.9 is normal. ? A BMI of 25 to 29.9 is considered overweight. ? A BMI of 30 and above is considered obese.   . Maintain normal blood lipids and cholesterol levels by exercising and minimizing your intake of saturated fat. Eat a balanced diet with plenty of fruit and vegetables. Blood tests for lipids  and cholesterol should begin at age 68 and be repeated every 5 years. If your lipid or cholesterol levels are high, you are over 50, or you are at high risk for heart disease, you may need your cholesterol levels checked more frequently. Ongoing high lipid and cholesterol levels should be treated with medicines if diet and exercise are not working.  . If you smoke, find out from your health care provider how to quit. If you do not use tobacco, please do not start.  . If you choose to drink alcohol, please do not consume more than 2 drinks per day.  One drink is considered to be 12 ounces (355 mL) of beer, 5 ounces (148 mL) of wine, or 1.5 ounces (44 mL) of liquor.  . If you are 60-25 years old, ask your health care provider if you should take aspirin to prevent strokes.  . Use sunscreen. Apply sunscreen liberally and repeatedly throughout the day. You should seek shade when your shadow is shorter than you. Protect yourself by wearing long sleeves, pants, a wide-brimmed hat, and sunglasses year round, whenever you are outdoors.  . Once a month, do a whole body skin exam, using a mirror to look at the skin on your back. Tell your health care provider of new moles, moles that have irregular borders, moles that are larger than a pencil eraser, or moles that have changed in shape or color.

## 2018-12-19 NOTE — Progress Notes (Signed)
Subjective:   Angela Bell is a 73 y.o. female who presents for Medicare Annual (Subsequent) preventive examination.  Review of Systems:  N/A Cardiac Risk Factors include: advanced age (>40men, >38 women)     Objective:     Vitals: BP 102/64 (BP Location: Right Arm, Patient Position: Sitting, Cuff Size: Normal)   Pulse 84   Temp 98.3 F (36.8 C) (Oral)   Ht 5' 5.25" (1.657 m) Comment: boots  Wt 140 lb 8 oz (63.7 kg)   SpO2 98%   BMI 23.20 kg/m   Body mass index is 23.2 kg/m.  Advanced Directives 12/19/2018 04/25/2018  Does Patient Have a Medical Advance Directive? Yes Yes  Type of Advance Directive Living will Living will    Tobacco Social History   Tobacco Use  Smoking Status Former Smoker  . Last attempt to quit: 11/23/1981  . Years since quitting: 37.0  Smokeless Tobacco Never Used     Counseling given: No   Clinical Intake:  Pre-visit preparation completed: Yes  Pain : No/denies pain Pain Score: 0-No pain     Nutritional Status: BMI of 19-24  Normal Nutritional Risks: None Diabetes: No  How often do you need to have someone help you when you read instructions, pamphlets, or other written materials from your doctor or pharmacy?: 1 - Never What is the last grade level you completed in school?: Associate degree  Interpreter Needed?: No  Comments: pt is divorced Information entered by :: LPinson, LPN  Past Medical History:  Diagnosis Date  . Allergy    allergic rhinitis  . B12 deficiency   . Back pain    disc disease lumbar  . Colon polyp   . Dyspepsia   . Fatty liver   . GERD (gastroesophageal reflux disease)   . Graves' disease   . Hyperlipidemia   . Hyperthyroidism    Grave's disease (tx with radioactive I)  . Hypothyroidism   . Osteopenia   . Vitamin D deficiency   . Yeast vaginitis    recurrent- keeps terazol on hand / otc does not work   Past Surgical History:  Procedure Laterality Date  . BREAST SURGERY     breast  reduction  . CARPAL TUNNEL RELEASE    . CATARACT EXTRACTION, BILATERAL    . CESAREAN SECTION     x 2  . chin implant    . COLONOSCOPY    . COLONOSCOPY WITH PROPOFOL N/A 04/25/2018   Procedure: COLONOSCOPY WITH PROPOFOL;  Surgeon: Manya Silvas, MD;  Location: [facility] ENDOSCOPY;  Service: Endoscopy;  Laterality: N/A;  . ESOPHAGOGASTRODUODENOSCOPY (EGD) WITH PROPOFOL N/A 04/25/2018   Procedure: ESOPHAGOGASTRODUODENOSCOPY (EGD) WITH PROPOFOL;  Surgeon: Manya Silvas, MD;  Location: [facility] ENDOSCOPY;  Service: Endoscopy;  Laterality: N/A;  . EYE SURGERY     x3 for strabismus  . REDUCTION MAMMAPLASTY     Family History  Problem Relation Age of Onset  . Osteoporosis Mother   . Heart disease Mother        mild heart problem  . Breast cancer Cousin   . Arthritis Father        RA   Social History   Socioeconomic History  . Marital status: Divorced    Spouse name: Not on file  . Number of children: Not on file  . Years of education: Not on file  . Highest education level: Not on file  Occupational History  . Not on file  Social Needs  . Financial resource strain:  Not on file  . Food insecurity:    Worry: Not on file    Inability: Not on file  . Transportation needs:    Medical: Not on file    Non-medical: Not on file  Tobacco Use  . Smoking status: Former Smoker    Last attempt to quit: 11/23/1981    Years since quitting: 37.0  . Smokeless tobacco: Never Used  Substance and Sexual Activity  . Alcohol use: Yes    Alcohol/week: 0.0 standard drinks    Comment: occassionally  . Drug use: No  . Sexual activity: Not Currently  Lifestyle  . Physical activity:    Days per week: Not on file    Minutes per session: Not on file  . Stress: Not on file  Relationships  . Social connections:    Talks on phone: Not on file    Gets together: Not on file    Attends religious service: Not on file    Active member of club or organization: Not on file    Attends meetings of clubs or  organizations: Not on file    Relationship status: Not on file  Other Topics Concern  . Not on file  Social History Narrative  . Not on file    Outpatient Encounter Medications as of 12/19/2018  Medication Sig  . Calcium Carbonate-Vit D-Min 600-400 MG-UNIT TABS Take 2 tablets by mouth daily.    . Cholecalciferol (VITAMIN D3) 5000 UNITS CAPS Take 1 capsule by mouth daily.    . Docosahexaenoic Acid 100 MG CAPS Take by mouth as needed.   . famotidine (PEPCID) 20 MG tablet Take 20 mg by mouth as needed for heartburn or indigestion.  . fexofenadine-pseudoephedrine (ALLEGRA-D 12 HOUR) 60-120 MG per tablet Take 1 tablet by mouth 2 (two) times daily as needed.  . Fluticasone Propionate (FLONASE NA) Place into the nose as needed.  Marland Kitchen levothyroxine (SYNTHROID) 112 MCG tablet 112 mcg 5 days per week; 100 mcg 2 days per week  . Multiple Vitamin (MULTIVITAMIN) tablet Take 2 times a week  . Omega-3 Fatty Acids (FISH OIL PO) Take 1,500 mg by mouth daily.   No facility-administered encounter medications on file as of 12/19/2018.     Activities of Daily Living In your present state of health, do you have any difficulty performing the following activities: 12/19/2018  Hearing? N  Vision? N  Difficulty concentrating or making decisions? N  Walking or climbing stairs? N  Dressing or bathing? N  Doing errands, shopping? N  Preparing Food and eating ? N  Using the Toilet? N  In the past six months, have you accidently leaked urine? N  Do you have problems with loss of bowel control? N  Managing your Medications? N  Managing your Finances? N  Housekeeping or managing your Housekeeping? N  Some recent data might be hidden    Patient Care Team: Tower, Wynelle Fanny, MD as PCP - General    Assessment:   This is a routine wellness examination for Angela Bell.   Hearing Screening   125Hz  250Hz  500Hz  1000Hz  2000Hz  3000Hz  4000Hz  6000Hz  8000Hz   Right ear:   40 40 40  40    Left ear:   40 40 40  40    Vision  Screening Comments: Vision exam in December 2019 with Dr. Satira Sark   Exercise Activities and Dietary recommendations Current Exercise Habits: Home exercise routine, Type of exercise: strength training/weights;stretching;Other - see comments(stationary bike), Time (Minutes): 30, Frequency (Times/Week): 3, Weekly Exercise (  Minutes/Week): 90, Intensity: Moderate, Exercise limited by: None identified  Goals    . Increase physical activity     Starting 12/19/18, I will continue to exercise for 30 minutes 3 days per week.        Fall Risk Fall Risk  12/19/2018 12/03/2017 11/29/2015 09/14/2014 08/29/2013  Falls in the past year? 0 No Yes No No  Number falls in past yr: - - 1 - -  Injury with Fall? - - Yes - -  Risk for fall due to : - - Impaired balance/gait - -  Follow up - - Falls evaluation completed - -    Depression Screen PHQ 2/9 Scores 12/19/2018 12/03/2017 11/29/2015 09/14/2014  PHQ - 2 Score 0 0 0 0  PHQ- 9 Score 0 - - -     Cognitive Function MMSE - Mini Mental State Exam 12/19/2018  Orientation to time 5  Orientation to Place 5  Registration 3  Attention/ Calculation 0  Recall 3  Language- name 2 objects 0  Language- repeat 1  Language- follow 3 step command 3  Language- read & follow direction 0  Write a sentence 0  Copy design 0  Total score 20     PLEASE NOTE: A Mini-Cog screen was completed. Maximum score is 20. A value of 0 denotes this part of Folstein MMSE was not completed or the patient failed this part of the Mini-Cog screening.   Mini-Cog Screening Orientation to Time - Max 5 pts Orientation to Place - Max 5 pts Registration - Max 3 pts Recall - Max 3 pts Language Repeat - Max 1 pts Language Follow 3 Step Command - Max 3 pts     Immunization History  Administered Date(s) Administered  . Hepatitis A, Adult 09/12/2018  . Hepatitis B, adult 07/21/2018  . Influenza Whole 07/16/2010  . Influenza, High Dose Seasonal PF 09/20/2018  . Influenza,inj,Quad PF,6+  Mos 08/29/2013, 09/14/2014, 11/06/2016, 09/24/2017  . Influenza,inj,quad, With Preservative 08/23/2017  . Influenza-Unspecified 10/25/2015  . Pneumococcal Conjugate-13 09/14/2014  . Pneumococcal Polysaccharide-23 09/23/2006, 08/29/2013  . Td 11/27/2010, 09/12/2018  . Tdap 07/21/2018  . Zoster 12/11/2010    Screening Tests Health Maintenance  Topic Date Due  . MAMMOGRAM  11/23/2019 (Originally 12/13/2018)  . TETANUS/TDAP  11/27/2020  . COLONOSCOPY  04/25/2028  . INFLUENZA VACCINE  Completed  . DEXA SCAN  Completed  . Hepatitis C Screening  Completed  . PNA vac Low Risk Adult  Completed      Plan:     I have personally reviewed, addressed, and noted the following in the patient's chart:  A. Medical and social history B. Use of alcohol, tobacco or illicit drugs  C. Current medications and supplements D. Functional ability and status E.  Nutritional status F.  Physical activity G. Advance directives H. List of other physicians I.  Hospitalizations, surgeries, and ER visits in previous 12 months J.  [place] to include hearing, vision, cognitive, depression L. Referrals and appointments - none  In addition, I have reviewed and discussed with patient certain preventive protocols, quality metrics, and best practice recommendations. A written personalized care plan for preventive services as well as general preventive health recommendations were provided to patient.  See attached scanned questionnaire for additional information.   Signed,   Lindell Noe, MHA, BS, LPN Health Coach

## 2018-12-19 NOTE — Progress Notes (Signed)
PCP notes:   Health maintenance:  Mammogram - addressed  Abnormal screenings:   None  Patient concerns:   Patient has concerns about fatty liver disease.  Nurse concerns:  None  Next PCP appt:   12/26/18 @ 1430  I reviewed health advisor's note, was available for consultation, and agree with documentation and plan. Loura Pardon MD

## 2018-12-22 DIAGNOSIS — J301 Allergic rhinitis due to pollen: Secondary | ICD-10-CM | POA: Diagnosis not present

## 2018-12-23 ENCOUNTER — Encounter: Payer: PPO | Admitting: Family Medicine

## 2018-12-26 ENCOUNTER — Encounter: Payer: Self-pay | Admitting: Family Medicine

## 2018-12-26 ENCOUNTER — Ambulatory Visit (INDEPENDENT_AMBULATORY_CARE_PROVIDER_SITE_OTHER): Payer: PPO | Admitting: Family Medicine

## 2018-12-26 VITALS — BP 120/68 | HR 78 | Temp 98.4°F | Ht 65.25 in | Wt 140.5 lb

## 2018-12-26 DIAGNOSIS — Z0000 Encounter for general adult medical examination without abnormal findings: Secondary | ICD-10-CM | POA: Diagnosis not present

## 2018-12-26 DIAGNOSIS — M8589 Other specified disorders of bone density and structure, multiple sites: Secondary | ICD-10-CM

## 2018-12-26 DIAGNOSIS — K760 Fatty (change of) liver, not elsewhere classified: Secondary | ICD-10-CM | POA: Diagnosis not present

## 2018-12-26 DIAGNOSIS — E538 Deficiency of other specified B group vitamins: Secondary | ICD-10-CM

## 2018-12-26 DIAGNOSIS — E559 Vitamin D deficiency, unspecified: Secondary | ICD-10-CM | POA: Diagnosis not present

## 2018-12-26 DIAGNOSIS — E890 Postprocedural hypothyroidism: Secondary | ICD-10-CM

## 2018-12-26 DIAGNOSIS — Z1231 Encounter for screening mammogram for malignant neoplasm of breast: Secondary | ICD-10-CM | POA: Diagnosis not present

## 2018-12-26 NOTE — Assessment & Plan Note (Signed)
dexa 2/18  No falls or fx Taking D/ calcium Enc exercise  Continue to follow  Intol of [place] in the past  D level good in 70s

## 2018-12-26 NOTE — Patient Instructions (Addendum)
Talk to Dr Tiffany Kocher about the fatty liver issue   Follow up with Dr Nelva Bush for your back  If walking does not bother you- start that  Also stationary bike also   For cholesterol  Avoid red meat/ fried foods/ egg yolks/ fatty breakfast meats/ butter, cheese and high fat dairy/ and shellfish    Stop at check out to schedule your mammogram

## 2018-12-26 NOTE — Assessment & Plan Note (Signed)
Reviewed health habits including diet and exercise and skin cancer prevention Reviewed appropriate screening tests for age  Also reviewed health mt list, fam hx and immunization status , as well as social and family history   See HPI Labs reviewed  Mammogram scheduled  Pt plans to f/u with Dr Nelva Bush about back  Also GI- Dr Tiffany Kocher re: fatty liver

## 2018-12-26 NOTE — Assessment & Plan Note (Signed)
Lab Results  Component Value Date   TSH 0.14 (L) 12/19/2018    Will pass on to endocrinology  Has had recent dose change  No clinical changes

## 2018-12-26 NOTE — Progress Notes (Signed)
Subjective:    Patient ID: Angela Bell, female    DOB: 10/24/1946, 73 y.o.   MRN: [id number]  HPI Here for health maintenance exam and to review chronic medical problems    Working and taking care of grand kids   Does not want to see her current PA with GI (they are watching fatty liver)  Does not drink etoh at all  Was never a heavy drinker in the past   Also has chronic back pain  Went and had a cortisone shot in her back a mo ago Now it hurts worse    Wt Readings from Last 3 Encounters:  12/26/18 140 lb 8 oz (63.7 kg)  12/19/18 140 lb 8 oz (63.7 kg)  04/25/18 135 lb (61.2 kg)  very good = she is trying to eat healthy  Cut back on bread and diet soda  Likes to exercise but low back hurts  23.20 kg/m   Had amw on 1/27 No concerns   Had labs at gyn   Mammogram 1/19  Self breast exam no lumps  Would like [place] to schedule it   Colonoscopy 6/19 with 5 year recall with Dr Tiffany Kocher (polyp)   dexa 2/18  Osteopenia D level is 73.6 boniva gave her leg cramps in the past No falls  No fractures    zostavax 1/12  Blood pressure  BP Readings from Last 3 Encounters:  12/26/18 120/68  12/19/18 102/64  04/25/18 (!) 101/59    Hypothyroid  Sees endorinologist  Lab Results  Component Value Date   TSH 0.14 (L) 12/19/2018   she takes 110 five days and 100 2 days of the week  Goes to endocrinology every 6 months    B12 def Lab Results  Component Value Date   VITAMINB12 380 12/19/2018  oral supplementation   Cholesterol Lab Results  Component Value Date   CHOL 201 (H) 12/19/2018   CHOL 177 12/02/2017   CHOL 210 (H) 11/25/2016   Lab Results  Component Value Date   HDL 65.60 12/19/2018   HDL 58.40 12/02/2017   HDL 66.00 11/25/2016   Lab Results  Component Value Date   LDLCALC 120 (H) 12/19/2018   LDLCALC 105 (H) 12/02/2017   LDLCALC 132 (H) 11/25/2016   Lab Results  Component Value Date   TRIG 75.0 12/19/2018   TRIG 65.0 12/02/2017   TRIG 59.0  11/25/2016   Lab Results  Component Value Date   CHOLHDL 3 12/19/2018   CHOLHDL 3 12/02/2017   CHOLHDL 3 11/25/2016   Lab Results  Component Value Date   LDLDIRECT 146.3 08/22/2013   LDLDIRECT 128.6 04/19/2012   LDL is up a bit  More eggs lately  No red meat  Rarely eats fried food  No sausage and bacon  No shellfish  Eats salmon   Lab Results  Component Value Date   WBC 4.2 12/19/2018   HGB 13.4 12/19/2018   HCT 40.2 12/19/2018   MCV 93.6 12/19/2018   PLT 241.0 12/19/2018    Lab Results  Component Value Date   CREATININE 0.73 12/19/2018   BUN 13 12/19/2018   NA 139 12/19/2018   K 3.9 12/19/2018   CL 105 12/19/2018   CO2 27 12/19/2018   Lab Results  Component Value Date   ALT 11 12/19/2018   AST 15 12/19/2018   ALKPHOS 121 (H) 12/19/2018   BILITOT 0.7 12/19/2018    Patient Active Problem List   Diagnosis Date Noted  . Screening  mammogram, encounter for 12/26/2018  . Fatty liver 12/26/2018  . Colon cancer screening 12/03/2017  . Encounter for hepatitis C screening test for low risk patient 12/01/2016  . Estrogen deficiency 12/01/2016  . B12 deficiency 12/01/2016  . Routine general medical examination at a health care facility 11/20/2015  . Encounter for Medicare annual wellness exam 08/21/2013  . Dyspepsia 04/25/2012  . Encounter for screening mammogram for breast cancer 12/22/2011  . ALLERGIC RHINITIS 12/14/2008  . Vitamin D deficiency 08/23/2008  . Hypothyroid 07/26/2008  . Osteopenia 07/23/2008   Past Medical History:  Diagnosis Date  . Allergy    allergic rhinitis  . B12 deficiency   . Back pain    disc disease lumbar  . Colon polyp   . Dyspepsia   . Fatty liver   . GERD (gastroesophageal reflux disease)   . Graves' disease   . Hyperlipidemia   . Hyperthyroidism    Grave's disease (tx with radioactive I)  . Hypothyroidism   . Osteopenia   . Vitamin D deficiency   . Yeast vaginitis    recurrent- keeps terazol on hand / otc does not  work   Past Surgical History:  Procedure Laterality Date  . BREAST SURGERY     breast reduction  . CARPAL TUNNEL RELEASE    . CATARACT EXTRACTION, BILATERAL    . CESAREAN SECTION     x 2  . chin implant    . COLONOSCOPY    . COLONOSCOPY WITH PROPOFOL N/A 04/25/2018   Procedure: COLONOSCOPY WITH PROPOFOL;  Surgeon: Manya Silvas, MD;  Location: [facility] ENDOSCOPY;  Service: Endoscopy;  Laterality: N/A;  . ESOPHAGOGASTRODUODENOSCOPY (EGD) WITH PROPOFOL N/A 04/25/2018   Procedure: ESOPHAGOGASTRODUODENOSCOPY (EGD) WITH PROPOFOL;  Surgeon: Manya Silvas, MD;  Location: [facility] ENDOSCOPY;  Service: Endoscopy;  Laterality: N/A;  . EYE SURGERY     x3 for strabismus  . REDUCTION MAMMAPLASTY     Social History   Tobacco Use  . Smoking status: Former Smoker    Last attempt to quit: 11/23/1981    Years since quitting: 37.1  . Smokeless tobacco: Never Used  Substance Use Topics  . Alcohol use: Yes    Alcohol/week: 0.0 standard drinks    Comment: occassionally  . Drug use: No   Family History  Problem Relation Age of Onset  . Osteoporosis Mother   . Heart disease Mother        mild heart problem  . Breast cancer Cousin   . Arthritis Father        RA   Allergies  Allergen Reactions  . Boniva [Ibandronic Acid]     Leg cramping    Current Outpatient Medications on File Prior to Visit  Medication Sig Dispense Refill  . Calcium Carbonate-Vit D-Min 600-400 MG-UNIT TABS Take 2 tablets by mouth daily.      . Cholecalciferol (VITAMIN D3) 5000 UNITS CAPS Take 1 capsule by mouth daily.      . famotidine (PEPCID) 20 MG tablet Take 20 mg by mouth as needed for heartburn or indigestion.    . fexofenadine-pseudoephedrine (ALLEGRA-D 12 HOUR) 60-120 MG per tablet Take 1 tablet by mouth 2 (two) times daily as needed. 60 tablet 3  . Fluticasone Propionate (FLONASE NA) Place into the nose as needed.    Marland Kitchen levothyroxine (SYNTHROID) 112 MCG tablet 112 mcg 5 days per week; 100 mcg 2 days per week    .  Multiple Vitamin (MULTIVITAMIN) tablet Take 2 times a week    .  Omega-3 Fatty Acids (FISH OIL PO) Take 1,500 mg by mouth daily.     No current facility-administered medications on file prior to visit.      Review of Systems  Constitutional: Negative for activity change, appetite change, fatigue, fever and unexpected weight change.  HENT: Negative for congestion, ear pain, rhinorrhea, sinus pressure and sore throat.   Eyes: Negative for pain, redness and visual disturbance.  Respiratory: Negative for cough, shortness of breath and wheezing.   Cardiovascular: Negative for chest pain and palpitations.  Gastrointestinal: Negative for abdominal distention, abdominal pain, blood in stool, constipation, diarrhea and nausea.  Endocrine: Negative for polydipsia and polyuria.  Genitourinary: Negative for dysuria, frequency and urgency.  Musculoskeletal: Positive for back pain. Negative for arthralgias and myalgias.  Skin: Negative for pallor and rash.  Allergic/Immunologic: Negative for environmental allergies.  Neurological: Negative for dizziness, syncope and headaches.  Hematological: Negative for adenopathy. Does not bruise/bleed easily.  Psychiatric/Behavioral: Negative for decreased concentration and dysphoric mood. The patient is not nervous/anxious.        Stressors        Objective:   Physical Exam Constitutional:      General: She is not in acute distress.    Appearance: Normal appearance. She is well-developed and normal weight. She is not ill-appearing.  HENT:     Head: Normocephalic and atraumatic.     Right Ear: Tympanic membrane, ear canal and external ear normal.     Left Ear: Ear canal and external ear normal.     Mouth/Throat:     Mouth: Mucous membranes are moist.     Pharynx: Oropharynx is clear.  Eyes:     General: No scleral icterus.    Conjunctiva/sclera: Conjunctivae normal.     Pupils: Pupils are equal, round, and reactive to light.  Neck:     Musculoskeletal:  Normal range of motion and neck supple.     Thyroid: No thyromegaly.     Vascular: No carotid bruit or JVD.  Cardiovascular:     Rate and Rhythm: Normal rate and regular rhythm.     Pulses: Normal pulses.     Heart sounds: Normal heart sounds. No gallop.   Pulmonary:     Effort: Pulmonary effort is normal. No respiratory distress.     Breath sounds: Normal breath sounds. No wheezing or rhonchi.  Chest:     Chest wall: No tenderness.  Abdominal:     General: Bowel sounds are normal. There is no distension or abdominal bruit.     Palpations: Abdomen is soft. There is no mass.     Tenderness: There is no abdominal tenderness.  Genitourinary:    Comments: Breast exam: No mass, nodules, thickening, tenderness, bulging, retraction, inflamation, nipple discharge or skin changes noted.  No axillary or clavicular LA.     Musculoskeletal: Normal range of motion.        General: No tenderness or deformity.     Right lower leg: No edema.     Left lower leg: No edema.     Comments: No kyphosis   Lymphadenopathy:     Cervical: No cervical adenopathy.  Skin:    General: Skin is warm and dry.     Capillary Refill: Capillary refill takes less than 2 seconds.     Coloration: Skin is not pale.     Findings: No erythema or rash.  Neurological:     General: No focal deficit present.     Mental Status: She is alert.  Cranial Nerves: No cranial nerve deficit.     Motor: No abnormal muscle tone.     Coordination: Coordination normal.     Deep Tendon Reflexes: Reflexes are normal and symmetric. Reflexes normal.  Psychiatric:        Mood and Affect: Mood normal.           Assessment & Plan:   Problem List Items Addressed This Visit      Digestive   Fatty liver    Pt continues to f/u with GI regarding this  Does not drink etoh as a rule Also avoiding acetaminophen Nl AST and ALT        Endocrine   Hypothyroid    Lab Results  Component Value Date   TSH 0.14 (L) 12/19/2018      Will pass on to endocrinology  Has had recent dose change  No clinical changes         Musculoskeletal and Integument   Osteopenia    dexa 2/18  No falls or fx Taking D/ calcium Enc exercise  Continue to follow  Intol of Boniva in the past  D level good in 70s        Other   Vitamin D deficiency    Vitamin D level is therapeutic with current supplementation Disc importance of this to bone and overall health Level in 70s       Routine general medical examination at a health care facility - Primary    Reviewed health habits including diet and exercise and skin cancer prevention Reviewed appropriate screening tests for age  Also reviewed health mt list, fam hx and immunization status , as well as social and family history   See HPI Labs reviewed  Mammogram scheduled  Pt plans to f/u with Dr Nelva Bush about back  Also GI- Dr Tiffany Kocher re: fatty liver       B12 deficiency    Lab Results  Component Value Date   VITAMINB12 380 12/19/2018   Will continue oral supplementation       Screening mammogram, encounter for    Scheduled annual screening mammogram Nl breast exam today  Encouraged monthly self exams        Relevant Orders   MM 3D SCREEN BREAST BILATERAL

## 2018-12-26 NOTE — Assessment & Plan Note (Signed)
Vitamin D level is therapeutic with current supplementation Disc importance of this to bone and overall health Level in 70s

## 2018-12-26 NOTE — Assessment & Plan Note (Signed)
Lab Results  Component Value Date   [id number] 081 12/19/2018   Will continue oral supplementation

## 2018-12-26 NOTE — Assessment & Plan Note (Signed)
Scheduled annual screening mammogram Nl breast exam today  Encouraged monthly self exams   

## 2018-12-26 NOTE — Assessment & Plan Note (Signed)
Pt continues to f/u with GI regarding this  Does not drink etoh as a rule Also avoiding acetaminophen Nl AST and ALT

## 2019-01-05 DIAGNOSIS — J301 Allergic rhinitis due to pollen: Secondary | ICD-10-CM | POA: Diagnosis not present

## 2019-01-12 DIAGNOSIS — J301 Allergic rhinitis due to pollen: Secondary | ICD-10-CM | POA: Diagnosis not present

## 2019-01-19 DIAGNOSIS — J301 Allergic rhinitis due to pollen: Secondary | ICD-10-CM | POA: Diagnosis not present

## 2019-01-26 DIAGNOSIS — J301 Allergic rhinitis due to pollen: Secondary | ICD-10-CM | POA: Diagnosis not present

## 2019-01-30 ENCOUNTER — Telehealth: Payer: Self-pay | Admitting: Family Medicine

## 2019-01-30 DIAGNOSIS — E890 Postprocedural hypothyroidism: Secondary | ICD-10-CM

## 2019-01-30 NOTE — Telephone Encounter (Signed)
Ref done Will send to [facility] 

## 2019-01-30 NOTE — Telephone Encounter (Signed)
Pt called wanting to get a referral to Dr Lavone Orn @ [facility] clinic  endocrinology   She just needs something closer to home  Best number [phone]  Pt stated she has already had her records from dr altheimer sent to [facility] they want a referral sent to them

## 2019-01-31 NOTE — Telephone Encounter (Signed)
Endocrinology Referral request sent to Dr Joycie Peek office at [facility].

## 2019-02-02 DIAGNOSIS — J301 Allergic rhinitis due to pollen: Secondary | ICD-10-CM | POA: Diagnosis not present

## 2019-02-09 DIAGNOSIS — J301 Allergic rhinitis due to pollen: Secondary | ICD-10-CM | POA: Diagnosis not present

## 2019-03-01 DIAGNOSIS — R509 Fever, unspecified: Secondary | ICD-10-CM | POA: Diagnosis not present

## 2019-03-01 DIAGNOSIS — Z8639 Personal history of other endocrine, nutritional and metabolic disease: Secondary | ICD-10-CM | POA: Diagnosis not present

## 2019-03-01 DIAGNOSIS — E890 Postprocedural hypothyroidism: Secondary | ICD-10-CM | POA: Diagnosis not present

## 2019-03-13 DIAGNOSIS — Z8639 Personal history of other endocrine, nutritional and metabolic disease: Secondary | ICD-10-CM | POA: Diagnosis not present

## 2019-03-13 DIAGNOSIS — J301 Allergic rhinitis due to pollen: Secondary | ICD-10-CM | POA: Diagnosis not present

## 2019-03-13 DIAGNOSIS — E890 Postprocedural hypothyroidism: Secondary | ICD-10-CM | POA: Diagnosis not present

## 2019-03-16 DIAGNOSIS — J301 Allergic rhinitis due to pollen: Secondary | ICD-10-CM | POA: Diagnosis not present

## 2019-03-23 DIAGNOSIS — J301 Allergic rhinitis due to pollen: Secondary | ICD-10-CM | POA: Diagnosis not present

## 2019-03-30 DIAGNOSIS — J301 Allergic rhinitis due to pollen: Secondary | ICD-10-CM | POA: Diagnosis not present

## 2019-04-06 DIAGNOSIS — J301 Allergic rhinitis due to pollen: Secondary | ICD-10-CM | POA: Diagnosis not present

## 2019-04-13 ENCOUNTER — Telehealth: Payer: Self-pay

## 2019-04-13 DIAGNOSIS — J301 Allergic rhinitis due to pollen: Secondary | ICD-10-CM | POA: Diagnosis not present

## 2019-04-13 NOTE — Telephone Encounter (Signed)
Copied from [place] [phone]. Topic: General - Other >> Apr 12, 2019  4:37 PM Angela Bell E wrote: Reason for CRM: Pt wants nurse to call her. After Pt eats sugar(like a piece of cake or pie) her heart races and shes been watching it for the past couple months

## 2019-04-13 NOTE — Telephone Encounter (Signed)
Aware-will see her tomorrow as planned  Please get an EKG when she arrives

## 2019-04-13 NOTE — Telephone Encounter (Signed)
I spoke with pt and for last 2 months when eats sugar her heart races and she feels like she is on steroids. Pt has already contacted endo and was advised to contact PCP. Pt is not having symptoms now. Last time pt had heart racing( not sure how fast heart was beating) was 04/12/19;after ate sweets a friend took BS 1 hr later BS was 163. Pt advised not to eat sweets until after sees Dr Glori Bickers on 04/13/19 a 9 AM. Pt will come fasting.no symptoms today. No fever,chills,cough,S/T,SOB,muscle pain,diarrhea (for 2 wks pt had diarrhea on and off but none today) no h/a or loss of taste or smell. ED precautions given. FYI to Dr Glori Bickers.

## 2019-04-14 ENCOUNTER — Ambulatory Visit (INDEPENDENT_AMBULATORY_CARE_PROVIDER_SITE_OTHER): Payer: PPO | Admitting: Family Medicine

## 2019-04-14 ENCOUNTER — Other Ambulatory Visit: Payer: Self-pay

## 2019-04-14 ENCOUNTER — Encounter: Payer: Self-pay | Admitting: Family Medicine

## 2019-04-14 VITALS — BP 122/70 | HR 80 | Temp 98.4°F | Ht 65.25 in | Wt 141.1 lb

## 2019-04-14 DIAGNOSIS — R000 Tachycardia, unspecified: Secondary | ICD-10-CM | POA: Insufficient documentation

## 2019-04-14 DIAGNOSIS — R7303 Prediabetes: Secondary | ICD-10-CM | POA: Insufficient documentation

## 2019-04-14 DIAGNOSIS — R7309 Other abnormal glucose: Secondary | ICD-10-CM | POA: Diagnosis not present

## 2019-04-14 DIAGNOSIS — K644 Residual hemorrhoidal skin tags: Secondary | ICD-10-CM

## 2019-04-14 DIAGNOSIS — K760 Fatty (change of) liver, not elsewhere classified: Secondary | ICD-10-CM

## 2019-04-14 DIAGNOSIS — E890 Postprocedural hypothyroidism: Secondary | ICD-10-CM | POA: Diagnosis not present

## 2019-04-14 DIAGNOSIS — R195 Other fecal abnormalities: Secondary | ICD-10-CM | POA: Diagnosis not present

## 2019-04-14 LAB — BASIC METABOLIC PANEL
BUN: 12 mg/dL (ref 6–23)
CO2: 28 mEq/L (ref 19–32)
Calcium: 9.4 mg/dL (ref 8.4–10.5)
Chloride: 104 mEq/L (ref 96–112)
Creatinine, Ser: 0.76 mg/dL (ref 0.40–1.20)
GFR: 75.09 mL/min (ref >60.00)
Glucose, Bld: 91 mg/dL (ref 70–99)
Potassium: 4.4 mEq/L (ref 3.5–5.1)
Sodium: 139 mEq/L (ref 135–145)

## 2019-04-14 LAB — HEMOGLOBIN A1C: Hgb A1c MFr Bld: 5.6 % (ref 4.6–6.5)

## 2019-04-14 LAB — TSH: TSH: 0.27 u[IU]/mL — ABNORMAL LOW (ref 0.35–4.50)

## 2019-04-14 MED ORDER — HYDROCORTISONE ACETATE 1 % EX OINT: TOPICAL_OINTMENT | CUTANEOUS | 1 refills | Status: DC

## 2019-04-14 NOTE — Patient Instructions (Signed)
Avoid sweets Lab today  Depending on results will see if we want to do a glucose tolerance tests   Alert me if symptoms worsen or change in the meantime

## 2019-04-14 NOTE — Progress Notes (Signed)
Subjective:    Patient ID: Angela Bell, female    DOB: 22-Aug-1946, 73 y.o.   MRN: [id number]  HPI Here for c/o rapid heartbeat which occurs after eating sweets   Working  Being careful -wears mask if she walks out of office    This started about 2 months ago  Happens every time she eats sweets   She reacts within minutes of finishing the item  Feels like her heart is beating very fast  Feels hyped up and a bit sob  Feels like she needs to lie down  It lasts about 4 hours or so   No sweating or flushing  No hives  No itching  A little light headed No wheezing  No swelling in throat or mouth   Sister is prediabetic  No other fam hx   Example: german chocolate pie   A small pc of candy- brief and less severe   No problems with bread/pasta Can put some jelly on a sandwich   Has never had these symptoms not assoc with sugar   Wt Readings from Last 3 Encounters:  04/14/19 141 lb 1 oz (64 kg)  12/26/18 140 lb 8 oz (63.7 kg)  12/19/18 140 lb 8 oz (63.7 kg)  exercises with stationary bike/ stretch /walk  Diet in general is "not good"  Eats too much fast food /eating out  Not a lot of rice  Perhaps 3 pc of bread per day  Eats meat and veg  Not a night eater  23.29 kg/m   Pulse Readings from Last 3 Encounters:  04/14/19 80  12/26/18 78  12/19/18 84   BP Readings from Last 3 Encounters:  04/14/19 122/70  12/26/18 120/68  12/19/18 102/64   Has ? About fatty liver condition  Lab Results  Component Value Date   ALT 11 12/19/2018   AST 15 12/19/2018   ALKPHOS 121 (H) 12/19/2018   BILITOT 0.7 12/19/2018    Also some loose stools -- 2 weeks  On and off Has hemorrhoid issues -using prep H External -burn and itch  No bleeding at all  No abd pain or n/v  Has had episodes before  Has "spastic colon"= set off by sweets as well   Dairy intol as well   Last labs glucose was 121 D was 73.6 B12 380  TSH was low Lab Results  Component Value Date   TSH 0.14 (L) 12/19/2018   at endocrinology office on 4/20 it was 0.612 (in their nl range)  She sees Dr Gabriel Carina for hyperthyroidism/hypothyroisism  Graves dz diab 10 y ago  RI treatment for ablation  Now takes levothyroxine 100 mcg daily    EKG today shows NSR with rate of 67-reassuring   Patient Active Problem List   Diagnosis Date Noted  . Loose stools 04/14/2019  . External hemorrhoids 04/14/2019  . Elevated random blood glucose level 04/14/2019  . Tachycardia 04/14/2019  . Screening mammogram, encounter for 12/26/2018  . Fatty liver 12/26/2018  . Colon cancer screening 12/03/2017  . Encounter for hepatitis C screening test for low risk patient 12/01/2016  . Estrogen deficiency 12/01/2016  . B12 deficiency 12/01/2016  . Routine general medical examination at a health care facility 11/20/2015  . Encounter for Medicare annual wellness exam 08/21/2013  . Dyspepsia 04/25/2012  . Encounter for screening mammogram for breast cancer 12/22/2011  . ALLERGIC RHINITIS 12/14/2008  . Vitamin D deficiency 08/23/2008  . Hypothyroid 07/26/2008  . Osteopenia 07/23/2008  Past Medical History:  Diagnosis Date  . Allergy    allergic rhinitis  . B12 deficiency   . Back pain    disc disease lumbar  . Colon polyp   . Dyspepsia   . Fatty liver   . GERD (gastroesophageal reflux disease)   . Graves' disease   . Hyperlipidemia   . Hyperthyroidism    Grave's disease (tx with radioactive I)  . Hypothyroidism   . Osteopenia   . Vitamin D deficiency   . Yeast vaginitis    recurrent- keeps terazol on hand / otc does not work   Past Surgical History:  Procedure Laterality Date  . BREAST SURGERY     breast reduction  . CARPAL TUNNEL RELEASE    . CATARACT EXTRACTION, BILATERAL    . CESAREAN SECTION     x 2  . chin implant    . COLONOSCOPY    . COLONOSCOPY WITH PROPOFOL N/A 04/25/2018   Procedure: COLONOSCOPY WITH PROPOFOL;  Surgeon: Manya Silvas, MD;  Location: [facility] ENDOSCOPY;   Service: Endoscopy;  Laterality: N/A;  . ESOPHAGOGASTRODUODENOSCOPY (EGD) WITH PROPOFOL N/A 04/25/2018   Procedure: ESOPHAGOGASTRODUODENOSCOPY (EGD) WITH PROPOFOL;  Surgeon: Manya Silvas, MD;  Location: [facility] ENDOSCOPY;  Service: Endoscopy;  Laterality: N/A;  . EYE SURGERY     x3 for strabismus  . REDUCTION MAMMAPLASTY     Social History   Tobacco Use  . Smoking status: Former Smoker    Last attempt to quit: 11/23/1981    Years since quitting: 37.4  . Smokeless tobacco: Never Used  Substance Use Topics  . Alcohol use: Yes    Alcohol/week: 0.0 standard drinks    Comment: rare  . Drug use: No   Family History  Problem Relation Age of Onset  . Osteoporosis Mother   . Heart disease Mother        mild heart problem  . Breast cancer Cousin   . Arthritis Father        RA   Allergies  Allergen Reactions  . Boniva [Ibandronic Acid]     Leg cramping    Current Outpatient Medications on File Prior to Visit  Medication Sig Dispense Refill  . Calcium Carbonate-Vit D-Min 600-400 MG-UNIT TABS Take 2 tablets by mouth daily.      . Cholecalciferol (VITAMIN D3) 5000 UNITS CAPS Take 1 capsule by mouth daily.      . famotidine (PEPCID) 20 MG tablet Take 20 mg by mouth as needed for heartburn or indigestion.    . fexofenadine-pseudoephedrine (ALLEGRA-D 12 HOUR) 60-120 MG per tablet Take 1 tablet by mouth 2 (two) times daily as needed. 60 tablet 3  . Fluticasone Propionate (FLONASE NA) Place into the nose as needed.    Marland Kitchen levothyroxine (SYNTHROID) 100 MCG tablet Take 1 tablet by mouth every morning.    . Liver Extract (LIVER PO) Take 1 tablet by mouth every other day.     . Multiple Vitamin (MULTIVITAMIN) tablet Take 2 times a week    . Omega-3 Fatty Acids (FISH OIL PO) Take 1,500 mg by mouth daily.     No current facility-administered medications on file prior to visit.      Review of Systems  Constitutional: Negative for activity change, appetite change, fatigue, fever and unexpected  weight change.  HENT: Negative for congestion, ear pain, rhinorrhea, sinus pressure and sore throat.   Eyes: Negative for pain, redness and visual disturbance.  Respiratory: Negative for cough, shortness of breath and wheezing.  Cardiovascular: Positive for palpitations. Negative for chest pain and leg swelling.       Rapid HR after eating sweets/sugar   Gastrointestinal: Negative for abdominal pain, blood in stool, constipation and diarrhea.  Endocrine: Negative for polydipsia and polyuria.  Genitourinary: Negative for dysuria, frequency and urgency.  Musculoskeletal: Negative for arthralgias, back pain and myalgias.  Skin: Negative for pallor and rash.  Allergic/Immunologic: Negative for environmental allergies.  Neurological: Negative for dizziness, syncope, light-headedness and headaches.  Hematological: Negative for adenopathy. Does not bruise/bleed easily.  Psychiatric/Behavioral: Negative for decreased concentration and dysphoric mood. The patient is not nervous/anxious.        Objective:   Physical Exam Constitutional:      General: She is not in acute distress.    Appearance: Normal appearance. She is well-developed and normal weight. She is not ill-appearing or diaphoretic.  HENT:     Head: Normocephalic and atraumatic.     Mouth/Throat:     Mouth: Mucous membranes are moist.     Pharynx: Oropharynx is clear.  Eyes:     Conjunctiva/sclera: Conjunctivae normal.     Pupils: Pupils are equal, round, and reactive to light.  Neck:     Musculoskeletal: Normal range of motion and neck supple.     Thyroid: No thyromegaly.     Vascular: No carotid bruit or JVD.  Cardiovascular:     Rate and Rhythm: Normal rate and regular rhythm.     Pulses: Normal pulses.     Heart sounds: Normal heart sounds. No gallop.   Pulmonary:     Effort: Pulmonary effort is normal. No respiratory distress.     Breath sounds: Normal breath sounds. No wheezing or rales.  Abdominal:     General:  Bowel sounds are normal. There is no distension or abdominal bruit.     Palpations: Abdomen is soft. There is no mass.     Tenderness: There is no abdominal tenderness. There is no guarding or rebound.  Musculoskeletal:     Right lower leg: No edema.     Left lower leg: No edema.  Lymphadenopathy:     Cervical: No cervical adenopathy.  Skin:    General: Skin is warm and dry.     Coloration: Skin is not pale.     Findings: No erythema or rash.  Neurological:     Mental Status: She is alert. Mental status is at baseline.     Motor: No weakness.     Coordination: Coordination normal.     Gait: Gait normal.     Deep Tendon Reflexes: Reflexes are normal and symmetric. Reflexes normal.  Psychiatric:        Mood and Affect: Mood normal.           Assessment & Plan:   Problem List Items Addressed This Visit      Cardiovascular and Mediastinum   External hemorrhoids    Prep H not working for burning and itching No bleeding Px anusol hc cream to try Update if not starting to improve in a week or if worsening          Digestive   Fatty liver    No clinical changes Continues GI f/u        Endocrine   Hypothyroid    TSH today  In light of palpitations (seems to be related to sweets so doubt this is the etiology) She sees extender in Dr [employer] office  Taking levothyroxine 100 mcg daily  Relevant Medications   levothyroxine (SYNTHROID) 100 MCG tablet   Other Relevant Orders   TSH (Completed)     Other   Loose stools    Causing worse hemorrhoids  Disc dietary change       Elevated random blood glucose level    A1C today  She is experiencing palpitations /jitteriness immediately after eating concentrated sweets/sugar May need a 3 h GTT depending on results disc imp of low glycemic diet and wt loss to prevent DM2       Relevant Orders   Basic metabolic panel (Completed)   Hemoglobin A1c (Completed)   Tachycardia - Primary    This occurs along with  jitteriness immed after eating sweets  Labs today- TSH/ A1C and bmp  Disc poss of hyper or hypoglycemia  May need 3 h GTT Disc low glycemic diet and frequent meals with protein       Relevant Orders   EKG 12-Lead (Completed)   TSH (Completed)   Basic metabolic panel (Completed)

## 2019-04-16 NOTE — Assessment & Plan Note (Signed)
A1C today  She is experiencing palpitations /jitteriness immediately after eating concentrated sweets/sugar May need a 3 h GTT depending on results disc imp of low glycemic diet and wt loss to prevent DM2

## 2019-04-16 NOTE — Assessment & Plan Note (Signed)
This occurs along with jitteriness immed after eating sweets  Labs today- TSH/ A1C and bmp  Disc poss of hyper or hypoglycemia  May need 3 h GTT Disc low glycemic diet and frequent meals with protein

## 2019-04-16 NOTE — Assessment & Plan Note (Signed)
Prep H not working for burning and itching No bleeding Px anusol hc cream to try Update if not starting to improve in a week or if worsening

## 2019-04-16 NOTE — Assessment & Plan Note (Signed)
Causing worse hemorrhoids  Disc dietary change

## 2019-04-16 NOTE — Assessment & Plan Note (Signed)
No clinical changes Continues GI f/u

## 2019-04-16 NOTE — Assessment & Plan Note (Signed)
TSH today  In light of palpitations (seems to be related to sweets so doubt this is the etiology) She sees extender in Dr [employer] office  Taking levothyroxine 100 mcg daily

## 2019-04-20 DIAGNOSIS — J301 Allergic rhinitis due to pollen: Secondary | ICD-10-CM | POA: Diagnosis not present

## 2019-04-27 DIAGNOSIS — J301 Allergic rhinitis due to pollen: Secondary | ICD-10-CM | POA: Diagnosis not present

## 2019-05-04 DIAGNOSIS — J301 Allergic rhinitis due to pollen: Secondary | ICD-10-CM | POA: Diagnosis not present

## 2019-05-11 DIAGNOSIS — J301 Allergic rhinitis due to pollen: Secondary | ICD-10-CM | POA: Diagnosis not present

## 2019-05-18 DIAGNOSIS — J301 Allergic rhinitis due to pollen: Secondary | ICD-10-CM | POA: Diagnosis not present

## 2019-05-23 DIAGNOSIS — J301 Allergic rhinitis due to pollen: Secondary | ICD-10-CM | POA: Diagnosis not present

## 2019-05-24 DIAGNOSIS — M545 Low back pain: Secondary | ICD-10-CM | POA: Diagnosis not present

## 2019-05-25 DIAGNOSIS — J301 Allergic rhinitis due to pollen: Secondary | ICD-10-CM | POA: Diagnosis not present

## 2019-06-01 DIAGNOSIS — J301 Allergic rhinitis due to pollen: Secondary | ICD-10-CM | POA: Diagnosis not present

## 2019-06-07 DIAGNOSIS — Z8639 Personal history of other endocrine, nutritional and metabolic disease: Secondary | ICD-10-CM | POA: Diagnosis not present

## 2019-06-07 DIAGNOSIS — E890 Postprocedural hypothyroidism: Secondary | ICD-10-CM | POA: Diagnosis not present

## 2019-06-08 DIAGNOSIS — J301 Allergic rhinitis due to pollen: Secondary | ICD-10-CM | POA: Diagnosis not present

## 2019-06-12 DIAGNOSIS — M9904 Segmental and somatic dysfunction of sacral region: Secondary | ICD-10-CM | POA: Diagnosis not present

## 2019-06-12 DIAGNOSIS — M9901 Segmental and somatic dysfunction of cervical region: Secondary | ICD-10-CM | POA: Diagnosis not present

## 2019-06-12 DIAGNOSIS — M461 Sacroiliitis, not elsewhere classified: Secondary | ICD-10-CM | POA: Diagnosis not present

## 2019-06-12 DIAGNOSIS — M5442 Lumbago with sciatica, left side: Secondary | ICD-10-CM | POA: Diagnosis not present

## 2019-06-12 DIAGNOSIS — M9903 Segmental and somatic dysfunction of lumbar region: Secondary | ICD-10-CM | POA: Diagnosis not present

## 2019-06-12 DIAGNOSIS — M5413 Radiculopathy, cervicothoracic region: Secondary | ICD-10-CM | POA: Diagnosis not present

## 2019-06-13 DIAGNOSIS — M9904 Segmental and somatic dysfunction of sacral region: Secondary | ICD-10-CM | POA: Diagnosis not present

## 2019-06-13 DIAGNOSIS — M5413 Radiculopathy, cervicothoracic region: Secondary | ICD-10-CM | POA: Diagnosis not present

## 2019-06-13 DIAGNOSIS — M461 Sacroiliitis, not elsewhere classified: Secondary | ICD-10-CM | POA: Diagnosis not present

## 2019-06-13 DIAGNOSIS — M9903 Segmental and somatic dysfunction of lumbar region: Secondary | ICD-10-CM | POA: Diagnosis not present

## 2019-06-13 DIAGNOSIS — M5442 Lumbago with sciatica, left side: Secondary | ICD-10-CM | POA: Diagnosis not present

## 2019-06-13 DIAGNOSIS — M9901 Segmental and somatic dysfunction of cervical region: Secondary | ICD-10-CM | POA: Diagnosis not present

## 2019-06-14 DIAGNOSIS — E890 Postprocedural hypothyroidism: Secondary | ICD-10-CM | POA: Diagnosis not present

## 2019-06-14 DIAGNOSIS — Z8639 Personal history of other endocrine, nutritional and metabolic disease: Secondary | ICD-10-CM | POA: Diagnosis not present

## 2019-06-15 DIAGNOSIS — M9903 Segmental and somatic dysfunction of lumbar region: Secondary | ICD-10-CM | POA: Diagnosis not present

## 2019-06-15 DIAGNOSIS — J301 Allergic rhinitis due to pollen: Secondary | ICD-10-CM | POA: Diagnosis not present

## 2019-06-15 DIAGNOSIS — M5442 Lumbago with sciatica, left side: Secondary | ICD-10-CM | POA: Diagnosis not present

## 2019-06-15 DIAGNOSIS — M9904 Segmental and somatic dysfunction of sacral region: Secondary | ICD-10-CM | POA: Diagnosis not present

## 2019-06-15 DIAGNOSIS — M9901 Segmental and somatic dysfunction of cervical region: Secondary | ICD-10-CM | POA: Diagnosis not present

## 2019-06-15 DIAGNOSIS — M461 Sacroiliitis, not elsewhere classified: Secondary | ICD-10-CM | POA: Diagnosis not present

## 2019-06-15 DIAGNOSIS — M5413 Radiculopathy, cervicothoracic region: Secondary | ICD-10-CM | POA: Diagnosis not present

## 2019-06-19 DIAGNOSIS — M9901 Segmental and somatic dysfunction of cervical region: Secondary | ICD-10-CM | POA: Diagnosis not present

## 2019-06-19 DIAGNOSIS — M9904 Segmental and somatic dysfunction of sacral region: Secondary | ICD-10-CM | POA: Diagnosis not present

## 2019-06-19 DIAGNOSIS — M461 Sacroiliitis, not elsewhere classified: Secondary | ICD-10-CM | POA: Diagnosis not present

## 2019-06-19 DIAGNOSIS — M5413 Radiculopathy, cervicothoracic region: Secondary | ICD-10-CM | POA: Diagnosis not present

## 2019-06-19 DIAGNOSIS — M9903 Segmental and somatic dysfunction of lumbar region: Secondary | ICD-10-CM | POA: Diagnosis not present

## 2019-06-19 DIAGNOSIS — M5442 Lumbago with sciatica, left side: Secondary | ICD-10-CM | POA: Diagnosis not present

## 2019-06-22 DIAGNOSIS — J301 Allergic rhinitis due to pollen: Secondary | ICD-10-CM | POA: Diagnosis not present

## 2019-06-26 DIAGNOSIS — M9901 Segmental and somatic dysfunction of cervical region: Secondary | ICD-10-CM | POA: Diagnosis not present

## 2019-06-26 DIAGNOSIS — M9903 Segmental and somatic dysfunction of lumbar region: Secondary | ICD-10-CM | POA: Diagnosis not present

## 2019-06-26 DIAGNOSIS — M461 Sacroiliitis, not elsewhere classified: Secondary | ICD-10-CM | POA: Diagnosis not present

## 2019-06-26 DIAGNOSIS — M5413 Radiculopathy, cervicothoracic region: Secondary | ICD-10-CM | POA: Diagnosis not present

## 2019-06-26 DIAGNOSIS — M5442 Lumbago with sciatica, left side: Secondary | ICD-10-CM | POA: Diagnosis not present

## 2019-06-26 DIAGNOSIS — M9904 Segmental and somatic dysfunction of sacral region: Secondary | ICD-10-CM | POA: Diagnosis not present

## 2019-06-29 DIAGNOSIS — J301 Allergic rhinitis due to pollen: Secondary | ICD-10-CM | POA: Diagnosis not present

## 2019-07-05 ENCOUNTER — Ambulatory Visit (INDEPENDENT_AMBULATORY_CARE_PROVIDER_SITE_OTHER): Payer: PPO | Admitting: Internal Medicine

## 2019-07-05 ENCOUNTER — Encounter: Payer: Self-pay | Admitting: Internal Medicine

## 2019-07-05 ENCOUNTER — Ambulatory Visit (INDEPENDENT_AMBULATORY_CARE_PROVIDER_SITE_OTHER)
Admission: RE | Admit: 2019-07-05 | Discharge: 2019-07-05 | Disposition: A | Discharge status: Home / Self Care | Payer: PPO | Source: Ambulatory Visit | Attending: Internal Medicine | Admitting: Internal Medicine

## 2019-07-05 ENCOUNTER — Other Ambulatory Visit: Payer: Self-pay

## 2019-07-05 VITALS — BP 110/72 | HR 62 | Temp 97.7°F | Wt 141.0 lb

## 2019-07-05 DIAGNOSIS — K5900 Constipation, unspecified: Secondary | ICD-10-CM | POA: Diagnosis not present

## 2019-07-05 DIAGNOSIS — R1032 Left lower quadrant pain: Secondary | ICD-10-CM

## 2019-07-05 LAB — POC URINALSYSI DIPSTICK (AUTOMATED)
Bilirubin, UA: NEGATIVE
Blood, UA: NEGATIVE
Glucose, UA: NEGATIVE
Ketones, UA: NEGATIVE
Leukocytes, UA: NEGATIVE
Nitrite, UA: NEGATIVE
Protein, UA: NEGATIVE
Spec Grav, UA: 1.01 (ref 1.010–1.025)
Urobilinogen, UA: 0.2 E.U./dL
pH, UA: 5 (ref 5.0–8.0)

## 2019-07-05 NOTE — Progress Notes (Signed)
Subjective:    Patient ID: Angela Bell, female    DOB: February 16, 1946, 73 y.o.   MRN: [id number]  HPI  Pt presents to the clinic today with c/o LLQ pain and constipation. She reports this started last night. She describes the pain as cramping. The pain does not radiate. She has passed gas only once in the last 12 hours. She denies nausea, vomiting or worsening abdominal pain. She did have a small BM this morning. She denies changes in diet or activity level. She has taken Gas X with some relief. She has never had issues with constipation in the past.  She also reports dark urine. She noticed this today. She denies urgency, frequency or dysuria. She denies fever, chills, nausea or low back pain. She has not taken anything OTC for her symptoms.   Review of Systems      Past Medical History:  Diagnosis Date  . Allergy    allergic rhinitis  . B12 deficiency   . Back pain    disc disease lumbar  . Colon polyp   . Dyspepsia   . Fatty liver   . GERD (gastroesophageal reflux disease)   . Graves' disease   . Hyperlipidemia   . Hyperthyroidism    Grave's disease (tx with radioactive I)  . Hypothyroidism   . Osteopenia   . Vitamin D deficiency   . Yeast vaginitis    recurrent- keeps terazol on hand / otc does not work    Current Outpatient Medications  Medication Sig Dispense Refill  . Calcium Carbonate-Vit D-Min 600-400 MG-UNIT TABS Take 2 tablets by mouth daily.      . Cholecalciferol (VITAMIN D3) 5000 UNITS CAPS Take 1 capsule by mouth daily.      . famotidine (PEPCID) 20 MG tablet Take 20 mg by mouth as needed for heartburn or indigestion.    . fexofenadine-pseudoephedrine (ALLEGRA-D 12 HOUR) 60-120 MG per tablet Take 1 tablet by mouth 2 (two) times daily as needed. 60 tablet 3  . Fluticasone Propionate (FLONASE NA) Place into the nose as needed.    . Hydrocortisone Acetate 1 % OINT Apply externally to hemorrhoids once daily as needed 1 Tube 1  . levothyroxine (SYNTHROID) 100  MCG tablet Take 1 tablet by mouth every morning.    . Liver Extract (LIVER PO) Take 1 tablet by mouth every other day.     . Multiple Vitamin (MULTIVITAMIN) tablet Take 2 times a week    . Omega-3 Fatty Acids (FISH OIL PO) Take 1,500 mg by mouth daily.     No current facility-administered medications for this visit.     Allergies  Allergen Reactions  . Boniva [Ibandronic Acid]     Leg cramping     Family History  Problem Relation Age of Onset  . Osteoporosis Mother   . Heart disease Mother        mild heart problem  . Breast cancer Cousin   . Arthritis Father        RA    Social History   Socioeconomic History  . Marital status: Divorced    Spouse name: Not on file  . Number of children: Not on file  . Years of education: Not on file  . Highest education level: Not on file  Occupational History  . Not on file  Social Needs  . Financial resource strain: Not on file  . Food insecurity    Worry: Not on file    Inability: Not on file  .  Transportation needs    Medical: Not on file    Non-medical: Not on file  Tobacco Use  . Smoking status: Former Smoker    Quit date: 11/23/1981    Years since quitting: 37.6  . Smokeless tobacco: Never Used  Substance and Sexual Activity  . Alcohol use: Yes    Alcohol/week: 0.0 standard drinks    Comment: rare  . Drug use: No  . Sexual activity: Not Currently  Lifestyle  . Physical activity    Days per week: Not on file    Minutes per session: Not on file  . Stress: Not on file  Relationships  . Social Herbalist on phone: Not on file    Gets together: Not on file    Attends religious service: Not on file    Active member of club or organization: Not on file    Attends meetings of clubs or organizations: Not on file    Relationship status: Not on file  . Intimate partner violence    Fear of current or ex partner: Not on file    Emotionally abused: Not on file    Physically abused: Not on file    Forced sexual  activity: Not on file  Other Topics Concern  . Not on file  Social History Narrative  . Not on file     Constitutional: Denies fever, malaise, fatigue, headache or abrupt weight changes.  Respiratory: Denies difficulty breathing, shortness of breath, cough or sputum production.   Cardiovascular: Denies chest pain, chest tightness, palpitations or swelling in the hands or feet.  Gastrointestinal: Pt reports constipation. Denies abdominal pain, bloating, diarrhea or blood in the stool.   No other specific complaints in a complete review of systems (except as listed in HPI above).  Objective:   Physical Exam   BP 110/72   Pulse 62   Temp 97.7 F (36.5 C) (Temporal)   Wt 141 lb (64 kg)   SpO2 99%   BMI 23.28 kg/m  Wt Readings from Last 3 Encounters:  07/05/19 141 lb (64 kg)  04/14/19 141 lb 1 oz (64 kg)  12/26/18 140 lb 8 oz (63.7 kg)    General: Appears her stated age, well developed, well nourished in NAD. Cardiovascular: Normal rate and rhythm.  Pulmonary/Chest: Normal effort and positive vesicular breath sounds. No respiratory distress. No wheezes, rales or ronchi noted.  Abdomen: Soft. Mild tenderness in the LLQ.  Hypoactive bowel sounds. No distention or masses noted.  Neurological: Alert and oriented.    BMET    Component Value Date/Time   NA 139 04/14/2019 0932   K 4.4 04/14/2019 0932   CL 104 04/14/2019 0932   CO2 28 04/14/2019 0932   GLUCOSE 91 04/14/2019 0932   BUN 12 04/14/2019 0932   CREATININE 0.76 04/14/2019 0932   CALCIUM 9.4 04/14/2019 0932   GFRNONAA 93.10 11/25/2010 0821   GFRAA 110 07/23/2008 0000    Lipid Panel     Component Value Date/Time   CHOL 201 (H) 12/19/2018 0834   TRIG 75.0 12/19/2018 0834   HDL 65.60 12/19/2018 0834   CHOLHDL 3 12/19/2018 0834   VLDL 15.0 12/19/2018 0834   LDLCALC 120 (H) 12/19/2018 0834    CBC    Component Value Date/Time   WBC 4.2 12/19/2018 0834   RBC 4.30 12/19/2018 0834   HGB 13.4 12/19/2018 0834    HCT 40.2 12/19/2018 0834   PLT 241.0 12/19/2018 0834   MCV 93.6 12/19/2018 0834  MCHC 33.3 12/19/2018 0834   RDW 13.3 12/19/2018 0834   LYMPHSABS 1.2 12/19/2018 0834   MONOABS 0.4 12/19/2018 0834   EOSABS 0.1 12/19/2018 0834   BASOSABS 0.0 12/19/2018 0834    Hgb A1C Lab Results  Component Value Date   HGBA1C 5.6 04/14/2019           Assessment & Plan:   LLQ Pain, Constipation:  KUB today to r/o obstruction Increase water intake Start Miralax 17 gm PO daily Will monitor, ER precautions discussed  Will follow up after xray , return precautions discussed Webb Silversmith, NP

## 2019-07-06 ENCOUNTER — Encounter: Payer: Self-pay | Admitting: Internal Medicine

## 2019-07-06 NOTE — Patient Instructions (Signed)

## 2019-07-20 DIAGNOSIS — J301 Allergic rhinitis due to pollen: Secondary | ICD-10-CM | POA: Diagnosis not present

## 2019-07-24 DIAGNOSIS — E890 Postprocedural hypothyroidism: Secondary | ICD-10-CM | POA: Diagnosis not present

## 2019-07-27 DIAGNOSIS — J301 Allergic rhinitis due to pollen: Secondary | ICD-10-CM | POA: Diagnosis not present

## 2019-08-02 ENCOUNTER — Ambulatory Visit
Admission: RE | Admit: 2019-08-02 | Discharge: 2019-08-02 | Disposition: A | Discharge status: Home / Self Care | Payer: PPO | Source: Ambulatory Visit | Attending: Family Medicine | Admitting: Family Medicine

## 2019-08-02 DIAGNOSIS — Z1231 Encounter for screening mammogram for malignant neoplasm of breast: Secondary | ICD-10-CM | POA: Diagnosis not present

## 2019-08-03 DIAGNOSIS — J301 Allergic rhinitis due to pollen: Secondary | ICD-10-CM | POA: Diagnosis not present

## 2019-08-04 DIAGNOSIS — J301 Allergic rhinitis due to pollen: Secondary | ICD-10-CM | POA: Diagnosis not present

## 2019-08-10 DIAGNOSIS — J301 Allergic rhinitis due to pollen: Secondary | ICD-10-CM | POA: Diagnosis not present

## 2019-08-17 DIAGNOSIS — J301 Allergic rhinitis due to pollen: Secondary | ICD-10-CM | POA: Diagnosis not present

## 2019-08-24 DIAGNOSIS — J301 Allergic rhinitis due to pollen: Secondary | ICD-10-CM | POA: Diagnosis not present

## 2019-08-31 DIAGNOSIS — J301 Allergic rhinitis due to pollen: Secondary | ICD-10-CM | POA: Diagnosis not present

## 2019-09-04 DIAGNOSIS — E890 Postprocedural hypothyroidism: Secondary | ICD-10-CM | POA: Diagnosis not present

## 2019-09-07 DIAGNOSIS — J301 Allergic rhinitis due to pollen: Secondary | ICD-10-CM | POA: Diagnosis not present

## 2019-09-11 DIAGNOSIS — E890 Postprocedural hypothyroidism: Secondary | ICD-10-CM | POA: Diagnosis not present

## 2019-09-14 DIAGNOSIS — J301 Allergic rhinitis due to pollen: Secondary | ICD-10-CM | POA: Diagnosis not present

## 2019-09-21 DIAGNOSIS — J301 Allergic rhinitis due to pollen: Secondary | ICD-10-CM | POA: Diagnosis not present

## 2019-09-28 DIAGNOSIS — J301 Allergic rhinitis due to pollen: Secondary | ICD-10-CM | POA: Diagnosis not present

## 2019-10-05 DIAGNOSIS — J301 Allergic rhinitis due to pollen: Secondary | ICD-10-CM | POA: Diagnosis not present

## 2019-10-12 DIAGNOSIS — J301 Allergic rhinitis due to pollen: Secondary | ICD-10-CM | POA: Diagnosis not present

## 2019-10-23 ENCOUNTER — Other Ambulatory Visit: Payer: Self-pay | Admitting: Student

## 2019-10-23 DIAGNOSIS — M7918 Myalgia, other site: Secondary | ICD-10-CM | POA: Diagnosis not present

## 2019-10-23 DIAGNOSIS — K219 Gastro-esophageal reflux disease without esophagitis: Secondary | ICD-10-CM | POA: Diagnosis not present

## 2019-10-23 DIAGNOSIS — K760 Fatty (change of) liver, not elsewhere classified: Secondary | ICD-10-CM

## 2019-10-26 DIAGNOSIS — J301 Allergic rhinitis due to pollen: Secondary | ICD-10-CM | POA: Diagnosis not present

## 2019-10-27 DIAGNOSIS — J301 Allergic rhinitis due to pollen: Secondary | ICD-10-CM | POA: Diagnosis not present

## 2019-10-31 ENCOUNTER — Other Ambulatory Visit: Payer: Self-pay

## 2019-10-31 ENCOUNTER — Ambulatory Visit
Admission: RE | Admit: 2019-10-31 | Discharge: 2019-10-31 | Disposition: A | Discharge status: Home / Self Care | Payer: PPO | Source: Ambulatory Visit | Attending: Student | Admitting: Student

## 2019-10-31 DIAGNOSIS — K760 Fatty (change of) liver, not elsewhere classified: Secondary | ICD-10-CM | POA: Insufficient documentation

## 2019-11-02 DIAGNOSIS — J301 Allergic rhinitis due to pollen: Secondary | ICD-10-CM | POA: Diagnosis not present

## 2019-11-03 DIAGNOSIS — H04123 Dry eye syndrome of bilateral lacrimal glands: Secondary | ICD-10-CM | POA: Diagnosis not present

## 2019-11-03 DIAGNOSIS — H524 Presbyopia: Secondary | ICD-10-CM | POA: Diagnosis not present

## 2019-11-03 DIAGNOSIS — H40013 Open angle with borderline findings, low risk, bilateral: Secondary | ICD-10-CM | POA: Diagnosis not present

## 2019-11-03 DIAGNOSIS — H02055 Trichiasis without entropian left lower eyelid: Secondary | ICD-10-CM | POA: Diagnosis not present

## 2019-11-06 ENCOUNTER — Telehealth: Payer: Self-pay | Admitting: Family Medicine

## 2019-11-06 DIAGNOSIS — E559 Vitamin D deficiency, unspecified: Secondary | ICD-10-CM

## 2019-11-06 DIAGNOSIS — E890 Postprocedural hypothyroidism: Secondary | ICD-10-CM

## 2019-11-06 DIAGNOSIS — K760 Fatty (change of) liver, not elsewhere classified: Secondary | ICD-10-CM

## 2019-11-06 DIAGNOSIS — Z0000 Encounter for general adult medical examination without abnormal findings: Secondary | ICD-10-CM

## 2019-11-06 DIAGNOSIS — E538 Deficiency of other specified B group vitamins: Secondary | ICD-10-CM

## 2019-11-06 NOTE — Telephone Encounter (Signed)
Future lab order for feb 2021

## 2019-11-09 DIAGNOSIS — J301 Allergic rhinitis due to pollen: Secondary | ICD-10-CM | POA: Diagnosis not present

## 2019-11-22 DIAGNOSIS — J301 Allergic rhinitis due to pollen: Secondary | ICD-10-CM | POA: Diagnosis not present

## 2019-11-29 DIAGNOSIS — J301 Allergic rhinitis due to pollen: Secondary | ICD-10-CM | POA: Diagnosis not present

## 2019-11-30 DIAGNOSIS — M654 Radial styloid tenosynovitis [de Quervain]: Secondary | ICD-10-CM | POA: Diagnosis not present

## 2019-11-30 DIAGNOSIS — M13839 Other specified arthritis, unspecified wrist: Secondary | ICD-10-CM | POA: Diagnosis not present

## 2019-11-30 DIAGNOSIS — M25531 Pain in right wrist: Secondary | ICD-10-CM | POA: Diagnosis not present

## 2019-11-30 DIAGNOSIS — M13831 Other specified arthritis, right wrist: Secondary | ICD-10-CM | POA: Diagnosis not present

## 2019-11-30 DIAGNOSIS — M25831 Other specified joint disorders, right wrist: Secondary | ICD-10-CM | POA: Diagnosis not present

## 2019-12-06 DIAGNOSIS — J301 Allergic rhinitis due to pollen: Secondary | ICD-10-CM | POA: Diagnosis not present

## 2019-12-11 DIAGNOSIS — E890 Postprocedural hypothyroidism: Secondary | ICD-10-CM | POA: Diagnosis not present

## 2019-12-13 DIAGNOSIS — J301 Allergic rhinitis due to pollen: Secondary | ICD-10-CM | POA: Diagnosis not present

## 2019-12-18 DIAGNOSIS — E890 Postprocedural hypothyroidism: Secondary | ICD-10-CM | POA: Diagnosis not present

## 2019-12-20 DIAGNOSIS — J301 Allergic rhinitis due to pollen: Secondary | ICD-10-CM | POA: Diagnosis not present

## 2019-12-21 ENCOUNTER — Telehealth: Payer: Self-pay

## 2019-12-21 NOTE — Telephone Encounter (Signed)
LVM to call clinic, pt needs COVID screen and front door info 1.28.2021 TLJ

## 2019-12-25 ENCOUNTER — Other Ambulatory Visit (INDEPENDENT_AMBULATORY_CARE_PROVIDER_SITE_OTHER): Payer: PPO

## 2019-12-25 ENCOUNTER — Ambulatory Visit: Payer: PPO

## 2019-12-25 ENCOUNTER — Other Ambulatory Visit: Payer: Self-pay

## 2019-12-25 DIAGNOSIS — K760 Fatty (change of) liver, not elsewhere classified: Secondary | ICD-10-CM | POA: Diagnosis not present

## 2019-12-25 DIAGNOSIS — E890 Postprocedural hypothyroidism: Secondary | ICD-10-CM

## 2019-12-25 DIAGNOSIS — E538 Deficiency of other specified B group vitamins: Secondary | ICD-10-CM | POA: Diagnosis not present

## 2019-12-25 DIAGNOSIS — E559 Vitamin D deficiency, unspecified: Secondary | ICD-10-CM

## 2019-12-25 DIAGNOSIS — Z0000 Encounter for general adult medical examination without abnormal findings: Secondary | ICD-10-CM | POA: Diagnosis not present

## 2019-12-25 LAB — COMPREHENSIVE METABOLIC PANEL
ALT: 12 U/L (ref 0–35)
AST: 14 U/L (ref 0–37)
Albumin: 4.2 g/dL (ref 3.5–5.2)
Alkaline Phosphatase: 122 U/L — ABNORMAL HIGH (ref 39–117)
BUN: 16 mg/dL (ref 6–23)
CO2: 24 mEq/L (ref 19–32)
Calcium: 9.2 mg/dL (ref 8.4–10.5)
Chloride: 106 mEq/L (ref 96–112)
Creatinine, Ser: 0.8 mg/dL (ref 0.40–1.20)
GFR: 70.64 mL/min (ref >60.00)
Glucose, Bld: 89 mg/dL (ref 70–99)
Potassium: 3.9 mEq/L (ref 3.5–5.1)
Sodium: 140 mEq/L (ref 135–145)
Total Bilirubin: 0.7 mg/dL (ref 0.2–1.2)
Total Protein: 7.1 g/dL (ref 6.0–8.3)

## 2019-12-25 LAB — CBC WITH DIFFERENTIAL/PLATELET
Basophils Absolute: 0 10*3/uL (ref 0.0–0.1)
Basophils Relative: 0.7 % (ref 0.0–3.0)
Eosinophils Absolute: 0.1 10*3/uL (ref 0.0–0.7)
Eosinophils Relative: 1.7 % (ref 0.0–5.0)
HCT: 39.3 % (ref 36.0–46.0)
Hemoglobin: 13.3 g/dL (ref 12.0–15.0)
Lymphocytes Relative: 22.8 % (ref 12.0–46.0)
Lymphs Abs: 1.1 10*3/uL (ref 0.7–4.0)
MCHC: 33.7 g/dL (ref 30.0–36.0)
MCV: 96.1 fl (ref 78.0–100.0)
Monocytes Absolute: 0.4 10*3/uL (ref 0.1–1.0)
Monocytes Relative: 7.5 % (ref 3.0–12.0)
Neutro Abs: 3.2 10*3/uL (ref 1.4–7.7)
Neutrophils Relative %: 67.3 % (ref 43.0–77.0)
Platelets: 257 10*3/uL (ref 150.0–400.0)
RBC: 4.09 Mil/uL (ref 3.87–5.11)
RDW: 13.5 % (ref 11.5–15.5)
WBC: 4.7 10*3/uL (ref 4.0–10.5)

## 2019-12-25 LAB — LIPID PANEL
Cholesterol: 221 mg/dL — ABNORMAL HIGH (ref 0–200)
HDL: 69.5 mg/dL (ref >39.00)
LDL Cholesterol: 141 mg/dL — ABNORMAL HIGH (ref 0–99)
NonHDL: 151.72
Total CHOL/HDL Ratio: 3
Triglycerides: 53 mg/dL (ref 0.0–149.0)
VLDL: 10.6 mg/dL (ref 0.0–40.0)

## 2019-12-25 LAB — TSH: TSH: 2.27 u[IU]/mL (ref 0.35–4.50)

## 2019-12-25 LAB — VITAMIN D 25 HYDROXY (VIT D DEFICIENCY, FRACTURES): VITD: 72.6 ng/mL (ref 30.00–100.00)

## 2019-12-25 LAB — VITAMIN B12: Vitamin B-12: 590 pg/mL (ref 211–911)

## 2019-12-25 LAB — PROTIME-INR
INR: 1 ratio (ref 0.8–1.0)
Prothrombin Time: 10.8 s (ref 9.6–13.1)

## 2019-12-27 DIAGNOSIS — J301 Allergic rhinitis due to pollen: Secondary | ICD-10-CM | POA: Diagnosis not present

## 2020-01-01 ENCOUNTER — Ambulatory Visit (INDEPENDENT_AMBULATORY_CARE_PROVIDER_SITE_OTHER): Payer: PPO | Admitting: Family Medicine

## 2020-01-01 ENCOUNTER — Encounter: Payer: Self-pay | Admitting: Family Medicine

## 2020-01-01 ENCOUNTER — Other Ambulatory Visit: Payer: Self-pay

## 2020-01-01 VITALS — BP 106/70 | HR 75 | Temp 96.9°F | Ht 63.75 in | Wt 142.4 lb

## 2020-01-01 DIAGNOSIS — E559 Vitamin D deficiency, unspecified: Secondary | ICD-10-CM | POA: Diagnosis not present

## 2020-01-01 DIAGNOSIS — E2839 Other primary ovarian failure: Secondary | ICD-10-CM

## 2020-01-01 DIAGNOSIS — E785 Hyperlipidemia, unspecified: Secondary | ICD-10-CM | POA: Insufficient documentation

## 2020-01-01 DIAGNOSIS — E538 Deficiency of other specified B group vitamins: Secondary | ICD-10-CM

## 2020-01-01 DIAGNOSIS — E890 Postprocedural hypothyroidism: Secondary | ICD-10-CM

## 2020-01-01 DIAGNOSIS — M8589 Other specified disorders of bone density and structure, multiple sites: Secondary | ICD-10-CM

## 2020-01-01 DIAGNOSIS — E7800 Pure hypercholesterolemia, unspecified: Secondary | ICD-10-CM | POA: Diagnosis not present

## 2020-01-01 DIAGNOSIS — Z0000 Encounter for general adult medical examination without abnormal findings: Secondary | ICD-10-CM

## 2020-01-01 DIAGNOSIS — R7309 Other abnormal glucose: Secondary | ICD-10-CM | POA: Diagnosis not present

## 2020-01-01 DIAGNOSIS — Z8619 Personal history of other infectious and parasitic diseases: Secondary | ICD-10-CM | POA: Diagnosis not present

## 2020-01-01 DIAGNOSIS — K760 Fatty (change of) liver, not elsewhere classified: Secondary | ICD-10-CM

## 2020-01-01 MED ORDER — ROSUVASTATIN CALCIUM 5 MG PO TABS: 5.0000 mg | ORAL_TABLET | Freq: Every day | ORAL | 11 refills | Status: DC

## 2020-01-01 MED ORDER — FEXOFENADINE-PSEUDOEPHED ER 60-120 MG PO TB12: 1.0000 | ORAL_TABLET | Freq: Two times a day (BID) | ORAL | 11 refills | Status: DC | PRN

## 2020-01-01 MED ORDER — VALACYCLOVIR HCL 1 G PO TABS: ORAL_TABLET | ORAL | 3 refills | Status: DC

## 2020-01-01 NOTE — Assessment & Plan Note (Signed)
dexa 2/18  No falls or fx Intolerant of boniva - caused leg cramps  Taking vit D and level is tx Enc exercise as tolerated  F/u dexa ordered

## 2020-01-01 NOTE — Assessment & Plan Note (Signed)
Vitamin D level is therapeutic with current supplementation Disc importance of this to bone and overall health Level of 72.6

## 2020-01-01 NOTE — Patient Instructions (Addendum)
If you are interested in the new shingles vaccine (Shingrix) - call your local pharmacy to check on coverage and availability  If affordable, get on a wait list at your pharmacy to get the vaccine.  For cholesterol  Avoid red meat/ fried foods/ egg yolks/ fatty breakfast meats/ butter, cheese and high fat dairy/ and shellfish    I think your cholesterol is hereditary  Try crestor 5 mg daily -take in the evening  If any side effects -like muscle pain   Let's re check cholesterol in about 6 weeks

## 2020-01-01 NOTE — Progress Notes (Signed)
Subjective:    Patient ID: Angela Bell, female    DOB: [birth year], 74 y.o.   MRN: [id number]  This visit occurred during the SARS-CoV-2 public health emergency.  Safety protocols were in place, including screening questions prior to the visit, additional usage of staff PPE, and extensive cleaning of exam room while observing appropriate contact time as indicated for disinfecting solutions.    HPI Here for health maintenance exam and to review chronic medical problems    She declines amw   Wt Readings from Last 3 Encounters:  01/01/20 142 lb 7 oz (64.6 kg)  07/05/19 141 lb (64 kg)  04/14/19 141 lb 1 oz (64 kg)  no change  24.64 kg/m   Flu vaccine - had one in the fall   She is on wait list for covid shot   Mammogram 9/20 Self breast exam - no lumps   Colonoscopy 6/19   Td 10/19 pna vaccines complete   dexa 2/18- osteopenia worse at spine and stable at hip Falls- none  Fractures - none  Intolerant of Boniva in the past (leg cramps) Supplements - takes vit D  Vit D level 72.6 Exercise - trying to do more - then her R hip acted up (bursitis)   Interested in shingrix vaccine Had zostavax 1/12  H/o fatty liver Lab Results  Component Value Date   ALT 12 12/25/2019   AST 14 12/25/2019   ALKPHOS 122 (H) 12/25/2019   BILITOT 0.7 12/25/2019    Sees GI  Lab Results  Component Value Date   INR 1.0 12/25/2019      Hypothyroidism  Pt has no clinical changes No change in energy level/ hair or skin/ edema and no tremor Lab Results  Component Value Date   TSH 2.27 12/25/2019   75 mcg levothy daily   H/o elevated glucose in the past  Glucose was 89 this check Lab Results  Component Value Date   HGBA1C 5.6 04/14/2019   H/o B12 def Lab Results  Component Value Date   VITAMINB12 590 12/25/2019  oral supplementation   Cholesterol  Lab Results  Component Value Date   CHOL 221 (H) 12/25/2019   CHOL 201 (H) 12/19/2018   CHOL 177 12/02/2017   Lab  Results  Component Value Date   HDL 69.50 12/25/2019   HDL 65.60 12/19/2018   HDL 58.40 12/02/2017   Lab Results  Component Value Date   LDLCALC 141 (H) 12/25/2019   LDLCALC 120 (H) 12/19/2018   LDLCALC 105 (H) 12/02/2017   Lab Results  Component Value Date   TRIG 53.0 12/25/2019   TRIG 75.0 12/19/2018   TRIG 65.0 12/02/2017   Lab Results  Component Value Date   CHOLHDL 3 12/25/2019   CHOLHDL 3 12/19/2018   CHOLHDL 3 12/02/2017   Lab Results  Component Value Date   LDLDIRECT 146.3 08/22/2013   LDLDIRECT 128.6 04/19/2012   Significant inc in LDL to 141 Pt states she has not eaten sat/trans fats - in the past year  ? Hereditary  Both parents have had high cholesterol   Patient Active Problem List   Diagnosis Date Noted  . Hyperlipidemia 01/01/2020  . History of cold sores 01/01/2020  . Loose stools 04/14/2019  . External hemorrhoids 04/14/2019  . Elevated random blood glucose level 04/14/2019  . Tachycardia 04/14/2019  . Screening mammogram, encounter for 12/26/2018  . Fatty liver 12/26/2018  . Colon cancer screening 12/03/2017  . Encounter for hepatitis C screening test for  low risk patient 12/01/2016  . Estrogen deficiency 12/01/2016  . B12 deficiency 12/01/2016  . Routine general medical examination at a health care facility 11/20/2015  . Encounter for Medicare annual wellness exam 08/21/2013  . Dyspepsia 04/25/2012  . Encounter for screening mammogram for breast cancer 12/22/2011  . ALLERGIC RHINITIS 12/14/2008  . Vitamin D deficiency 08/23/2008  . Hypothyroid 07/26/2008  . Osteopenia 07/23/2008   Past Medical History:  Diagnosis Date  . Allergy    allergic rhinitis  . B12 deficiency   . Back pain    disc disease lumbar  . Colon polyp   . Dyspepsia   . Fatty liver   . GERD (gastroesophageal reflux disease)   . Graves' disease   . Hyperlipidemia   . Hyperthyroidism    Grave's disease (tx with radioactive I)  . Hypothyroidism   . Osteopenia     . Vitamin D deficiency   . Yeast vaginitis    recurrent- keeps terazol on hand / otc does not work   Past Surgical History:  Procedure Laterality Date  . BREAST SURGERY     breast reduction  . CARPAL TUNNEL RELEASE    . CATARACT EXTRACTION, BILATERAL    . CESAREAN SECTION     x 2  . chin implant    . COLONOSCOPY    . COLONOSCOPY WITH PROPOFOL N/A 04/25/2018   Procedure: COLONOSCOPY WITH PROPOFOL;  Surgeon: Manya Silvas, MD;  Location: [facility] ENDOSCOPY;  Service: Endoscopy;  Laterality: N/A;  . ESOPHAGOGASTRODUODENOSCOPY (EGD) WITH PROPOFOL N/A 04/25/2018   Procedure: ESOPHAGOGASTRODUODENOSCOPY (EGD) WITH PROPOFOL;  Surgeon: Manya Silvas, MD;  Location: [facility] ENDOSCOPY;  Service: Endoscopy;  Laterality: N/A;  . EYE SURGERY     x3 for strabismus  . REDUCTION MAMMAPLASTY Bilateral 1983   Social History   Tobacco Use  . Smoking status: Former Smoker    Quit date: 11/23/1981    Years since quitting: 38.1  . Smokeless tobacco: Never Used  Substance Use Topics  . Alcohol use: Yes    Alcohol/week: 0.0 standard drinks    Comment: rare  . Drug use: No   Family History  Problem Relation Age of Onset  . Osteoporosis Mother   . Heart disease Mother        mild heart problem  . Breast cancer Cousin   . Arthritis Father        RA   Allergies  Allergen Reactions  . Boniva [Ibandronic Acid]     Leg cramping    Current Outpatient Medications on File Prior to Visit  Medication Sig Dispense Refill  . Calcium Carbonate-Vit D-Min 600-400 MG-UNIT TABS Take 2 tablets by mouth daily.      . Cholecalciferol (VITAMIN D3) 5000 UNITS CAPS Take 1 capsule by mouth daily.      . famotidine (PEPCID) 20 MG tablet Take 20 mg by mouth as needed for heartburn or indigestion.    . Fluticasone Propionate (FLONASE NA) Place into the nose as needed.    . Hydrocortisone Acetate 1 % OINT Apply externally to hemorrhoids once daily as needed 1 Tube 1  . Liver Extract (LIVER PO) Take 1 tablet by mouth  every other day.     . Multiple Vitamin (MULTIVITAMIN) tablet Take 2 times a week    . Omega-3 Fatty Acids (FISH OIL PO) Take 1,500 mg by mouth daily.    Marland Kitchen SYNTHROID 75 MCG tablet 1 tablet daily, except once a week take 1/2 tablet  No current facility-administered medications on file prior to visit.     Review of Systems  Constitutional: Negative for activity change, appetite change, fatigue, fever and unexpected weight change.  HENT: Negative for congestion, ear pain, rhinorrhea, sinus pressure and sore throat.   Eyes: Negative for pain, redness and visual disturbance.  Respiratory: Negative for cough, shortness of breath and wheezing.   Cardiovascular: Negative for chest pain and palpitations.  Gastrointestinal: Negative for abdominal distention, abdominal pain, blood in stool, constipation, diarrhea, nausea and vomiting.  Endocrine: Negative for polydipsia and polyuria.  Genitourinary: Negative for dysuria, frequency and urgency.  Musculoskeletal: Negative for arthralgias, back pain and myalgias.  Skin: Negative for pallor and rash.  Allergic/Immunologic: Negative for environmental allergies.  Neurological: Negative for dizziness, syncope and headaches.  Hematological: Negative for adenopathy. Does not bruise/bleed easily.  Psychiatric/Behavioral: Negative for decreased concentration and dysphoric mood. The patient is not nervous/anxious.        Objective:   Physical Exam Constitutional:      General: She is not in acute distress.    Appearance: Normal appearance. She is well-developed and normal weight. She is not ill-appearing or diaphoretic.  HENT:     Head: Normocephalic and atraumatic.     Right Ear: Tympanic membrane, ear canal and external ear normal.     Left Ear: Tympanic membrane, ear canal and external ear normal.     Nose: Nose normal. No congestion.     Mouth/Throat:     Mouth: Mucous membranes are moist.     Pharynx: Oropharynx is clear. No posterior  oropharyngeal erythema.  Eyes:     General: No scleral icterus.    Extraocular Movements: Extraocular movements intact.     Conjunctiva/sclera: Conjunctivae normal.     Pupils: Pupils are equal, round, and reactive to light.  Neck:     Thyroid: No thyromegaly.     Vascular: No carotid bruit or JVD.  Cardiovascular:     Rate and Rhythm: Normal rate and regular rhythm.     Pulses: Normal pulses.     Heart sounds: Normal heart sounds. No gallop.   Pulmonary:     Effort: Pulmonary effort is normal. No respiratory distress.     Breath sounds: Normal breath sounds. No wheezing.     Comments: Good air exch Chest:     Chest wall: No tenderness.  Abdominal:     General: Bowel sounds are normal. There is no distension or abdominal bruit.     Palpations: Abdomen is soft. There is no mass.     Tenderness: There is no abdominal tenderness.     Hernia: No hernia is present.  Genitourinary:    Comments: Breast exam: No mass, nodules, thickening, tenderness, bulging, retraction, inflamation, nipple discharge or skin changes noted.  No axillary or clavicular LA.     Musculoskeletal:        General: No tenderness. Normal range of motion.     Cervical back: Normal range of motion and neck supple. No rigidity. No muscular tenderness.     Right lower leg: No edema.     Left lower leg: No edema.     Comments: No kyphosis   Lymphadenopathy:     Cervical: No cervical adenopathy.  Skin:    General: Skin is warm and dry.     Coloration: Skin is not pale.     Findings: No erythema or rash.     Comments: Solar lentigines diffusely   Neurological:     Mental  Status: She is alert. Mental status is at baseline.     Cranial Nerves: No cranial nerve deficit.     Motor: No abnormal muscle tone.     Coordination: Coordination normal.     Gait: Gait normal.     Deep Tendon Reflexes: Reflexes are normal and symmetric. Reflexes normal.  Psychiatric:        Mood and Affect: Mood normal.        Cognition and  Memory: Cognition and memory normal.     Comments: Pleasant and talkative            Assessment & Plan:   Problem List Items Addressed This Visit      Digestive   Fatty liver    LFTs are stable  Alk phos of 122  Nl transaminases  No clinical changes Enc low fat giet and wt control        Endocrine   Hypothyroid    Hypothyroidism  Pt has no clinical changes No change in energy level/ hair or skin/ edema and no tremor Lab Results  Component Value Date   TSH 2.27 12/25/2019          Relevant Medications   SYNTHROID 75 MCG tablet     Musculoskeletal and Integument   Osteopenia    dexa 2/18  No falls or fx Intolerant of boniva - caused leg cramps  Taking vit D and level is tx Enc exercise as tolerated  F/u dexa ordered         Other   Vitamin D deficiency    Vitamin D level is therapeutic with current supplementation Disc importance of this to bone and overall health Level of 72.6      Routine general medical examination at a health care facility - Primary    Reviewed health habits including diet and exercise and skin cancer prevention Reviewed appropriate screening tests for age  Also reviewed health mt list, fam hx and immunization status , as well as social and family history   See HPI Labs reviewed  Pt declines amw  On wait list for covid vaccine  Interested in shingrix vaccine later if covered  Nl glucose Dexa ordered for f/u of osteopenia  Cholesterol is up - plan to start low dose statin          Estrogen deficiency   Relevant Orders   DG Bone Density   B12 deficiency    Lab Results  Component Value Date   VITAMINB12 590 12/25/2019   Plan to continue current oral supplementation       Elevated random blood glucose level    Good glucose this check  disc imp of low glycemic diet and wt loss to prevent DM2       Hyperlipidemia    Disc goals for lipids and reasons to control them Rev last labs with pt Rev low sat fat diet in  detail LDL continues to climb (141) despite well controlled diet  Strong fam hx  Plan to start crestor 5 mg daily  Re check labs in 6 weeks        Relevant Medications   rosuvastatin (CRESTOR) 5 MG tablet   History of cold sores    Valtrex px for prn use

## 2020-01-01 NOTE — Assessment & Plan Note (Signed)
Disc goals for lipids and reasons to control them Rev last labs with pt Rev low sat fat diet in detail LDL continues to climb (141) despite well controlled diet  Strong fam hx  Plan to start crestor 5 mg daily  Re check labs in 6 weeks

## 2020-01-01 NOTE — Assessment & Plan Note (Signed)
Reviewed health habits including diet and exercise and skin cancer prevention Reviewed appropriate screening tests for age  Also reviewed health mt list, fam hx and immunization status , as well as social and family history   See HPI Labs reviewed  Pt declines amw  On wait list for covid vaccine  Interested in shingrix vaccine later if covered  Nl glucose Dexa ordered for f/u of osteopenia  Cholesterol is up - plan to start low dose statin

## 2020-01-01 NOTE — Assessment & Plan Note (Signed)
Hypothyroidism  Pt has no clinical changes No change in energy level/ hair or skin/ edema and no tremor Lab Results  Component Value Date   TSH 2.27 12/25/2019

## 2020-01-01 NOTE — Assessment & Plan Note (Signed)
Valtrex px for prn use

## 2020-01-01 NOTE — Assessment & Plan Note (Signed)
Lab Results  Component Value Date   VITAMINB12 590 12/25/2019   Plan to continue current oral supplementation

## 2020-01-01 NOTE — Assessment & Plan Note (Signed)
LFTs are stable  Alk phos of 122  Nl transaminases  No clinical changes Enc low fat giet and wt control

## 2020-01-01 NOTE — Assessment & Plan Note (Signed)
Good glucose this check  disc imp of low glycemic diet and wt loss to prevent DM2

## 2020-01-03 DIAGNOSIS — J301 Allergic rhinitis due to pollen: Secondary | ICD-10-CM | POA: Diagnosis not present

## 2020-01-10 DIAGNOSIS — J301 Allergic rhinitis due to pollen: Secondary | ICD-10-CM | POA: Diagnosis not present

## 2020-01-16 DIAGNOSIS — J301 Allergic rhinitis due to pollen: Secondary | ICD-10-CM | POA: Diagnosis not present

## 2020-01-17 DIAGNOSIS — J301 Allergic rhinitis due to pollen: Secondary | ICD-10-CM | POA: Diagnosis not present

## 2020-01-18 DIAGNOSIS — H6980 Other specified disorders of Eustachian tube, unspecified ear: Secondary | ICD-10-CM | POA: Diagnosis not present

## 2020-01-18 DIAGNOSIS — J309 Allergic rhinitis, unspecified: Secondary | ICD-10-CM | POA: Diagnosis not present

## 2020-01-24 DIAGNOSIS — J301 Allergic rhinitis due to pollen: Secondary | ICD-10-CM | POA: Diagnosis not present

## 2020-01-29 ENCOUNTER — Telehealth: Payer: Self-pay | Admitting: *Deleted

## 2020-01-29 NOTE — Telephone Encounter (Signed)
Try it every other day - if still not doing well just twice weekly  Let me know if this does not work out

## 2020-01-29 NOTE — Telephone Encounter (Signed)
Patient left a voicemail stating that she is taking Crestor 5 mg. Patient stated that she is aching and swelling. Patient wants to know if she can cut the pill in half or take it every other day? Patient stated that she is coming back for lab work on 02/12/20.

## 2020-01-29 NOTE — Telephone Encounter (Signed)
Pt notified of Dr. Marliss Coots instructions pt said she already dropped down to every other day and it's still causing leg and foot pain. Pt will try twice a week but if she is still having issues she will stop it and let us know

## 2020-01-31 DIAGNOSIS — J301 Allergic rhinitis due to pollen: Secondary | ICD-10-CM | POA: Diagnosis not present

## 2020-02-06 ENCOUNTER — Telehealth: Payer: Self-pay | Admitting: *Deleted

## 2020-02-06 NOTE — Telephone Encounter (Signed)
Patent left a voicemail stating that she has had a problem tolerating Crestor. Patient stated that she has tried taking it twice a week and still having joint aches and retaining fluid. Patient stated that she has stopped the medication. Patient stated that she will still plan on coming in next week for lab work.

## 2020-02-06 NOTE — Telephone Encounter (Signed)
Thanks for letting me know Please take off her med list

## 2020-02-06 NOTE — Telephone Encounter (Signed)
Med removed, added to intol list

## 2020-02-07 DIAGNOSIS — J301 Allergic rhinitis due to pollen: Secondary | ICD-10-CM | POA: Diagnosis not present

## 2020-02-12 ENCOUNTER — Other Ambulatory Visit (INDEPENDENT_AMBULATORY_CARE_PROVIDER_SITE_OTHER): Payer: PPO

## 2020-02-12 ENCOUNTER — Other Ambulatory Visit: Payer: Self-pay

## 2020-02-12 DIAGNOSIS — E7800 Pure hypercholesterolemia, unspecified: Secondary | ICD-10-CM | POA: Diagnosis not present

## 2020-02-12 LAB — LIPID PANEL
Cholesterol: 177 mg/dL (ref 0–200)
HDL: 56.5 mg/dL (ref >39.00)
LDL Cholesterol: 104 mg/dL — ABNORMAL HIGH (ref 0–99)
NonHDL: 120.47
Total CHOL/HDL Ratio: 3
Triglycerides: 80 mg/dL (ref 0.0–149.0)
VLDL: 16 mg/dL (ref 0.0–40.0)

## 2020-02-12 LAB — ALT: ALT: 11 U/L (ref 0–35)

## 2020-02-12 LAB — AST: AST: 16 U/L (ref 0–37)

## 2020-02-14 DIAGNOSIS — J301 Allergic rhinitis due to pollen: Secondary | ICD-10-CM | POA: Diagnosis not present

## 2020-02-21 DIAGNOSIS — J301 Allergic rhinitis due to pollen: Secondary | ICD-10-CM | POA: Diagnosis not present

## 2020-02-23 DIAGNOSIS — J301 Allergic rhinitis due to pollen: Secondary | ICD-10-CM | POA: Diagnosis not present

## 2020-02-28 DIAGNOSIS — J301 Allergic rhinitis due to pollen: Secondary | ICD-10-CM | POA: Diagnosis not present

## 2020-03-13 DIAGNOSIS — J301 Allergic rhinitis due to pollen: Secondary | ICD-10-CM | POA: Diagnosis not present

## 2020-03-20 DIAGNOSIS — J301 Allergic rhinitis due to pollen: Secondary | ICD-10-CM | POA: Diagnosis not present

## 2020-03-27 DIAGNOSIS — J301 Allergic rhinitis due to pollen: Secondary | ICD-10-CM | POA: Diagnosis not present

## 2020-04-03 DIAGNOSIS — J301 Allergic rhinitis due to pollen: Secondary | ICD-10-CM | POA: Diagnosis not present

## 2020-04-10 DIAGNOSIS — J301 Allergic rhinitis due to pollen: Secondary | ICD-10-CM | POA: Diagnosis not present

## 2020-04-12 DIAGNOSIS — J301 Allergic rhinitis due to pollen: Secondary | ICD-10-CM | POA: Diagnosis not present

## 2020-04-19 ENCOUNTER — Other Ambulatory Visit: Payer: Self-pay | Admitting: Family Medicine

## 2020-04-19 NOTE — Telephone Encounter (Signed)
Last filled on 04/14/19 #1 tube with 1 refill, CPE scheduled for 01/06/21

## 2020-04-24 DIAGNOSIS — J301 Allergic rhinitis due to pollen: Secondary | ICD-10-CM | POA: Diagnosis not present

## 2020-04-29 DIAGNOSIS — K760 Fatty (change of) liver, not elsewhere classified: Secondary | ICD-10-CM | POA: Diagnosis not present

## 2020-04-29 DIAGNOSIS — M7918 Myalgia, other site: Secondary | ICD-10-CM | POA: Diagnosis not present

## 2020-04-29 DIAGNOSIS — K219 Gastro-esophageal reflux disease without esophagitis: Secondary | ICD-10-CM | POA: Diagnosis not present

## 2020-04-29 DIAGNOSIS — K649 Unspecified hemorrhoids: Secondary | ICD-10-CM | POA: Diagnosis not present

## 2020-05-01 DIAGNOSIS — J301 Allergic rhinitis due to pollen: Secondary | ICD-10-CM | POA: Diagnosis not present

## 2020-05-07 DIAGNOSIS — M25551 Pain in right hip: Secondary | ICD-10-CM | POA: Diagnosis not present

## 2020-05-07 DIAGNOSIS — M7061 Trochanteric bursitis, right hip: Secondary | ICD-10-CM | POA: Diagnosis not present

## 2020-05-08 DIAGNOSIS — J301 Allergic rhinitis due to pollen: Secondary | ICD-10-CM | POA: Diagnosis not present

## 2020-05-14 ENCOUNTER — Ambulatory Visit
Admission: RE | Admit: 2020-05-14 | Discharge: 2020-05-14 | Disposition: A | Discharge status: Home / Self Care | Payer: PPO | Source: Ambulatory Visit | Attending: Family Medicine | Admitting: Family Medicine

## 2020-05-14 DIAGNOSIS — M8589 Other specified disorders of bone density and structure, multiple sites: Secondary | ICD-10-CM | POA: Diagnosis not present

## 2020-05-14 DIAGNOSIS — E2839 Other primary ovarian failure: Secondary | ICD-10-CM | POA: Diagnosis not present

## 2020-05-15 DIAGNOSIS — J301 Allergic rhinitis due to pollen: Secondary | ICD-10-CM | POA: Diagnosis not present

## 2020-05-22 DIAGNOSIS — J301 Allergic rhinitis due to pollen: Secondary | ICD-10-CM | POA: Diagnosis not present

## 2020-05-23 DIAGNOSIS — M25551 Pain in right hip: Secondary | ICD-10-CM | POA: Diagnosis not present

## 2020-05-29 DIAGNOSIS — J301 Allergic rhinitis due to pollen: Secondary | ICD-10-CM | POA: Diagnosis not present

## 2020-05-31 DIAGNOSIS — M25551 Pain in right hip: Secondary | ICD-10-CM | POA: Diagnosis not present

## 2020-06-04 DIAGNOSIS — M25551 Pain in right hip: Secondary | ICD-10-CM | POA: Diagnosis not present

## 2020-06-05 DIAGNOSIS — J301 Allergic rhinitis due to pollen: Secondary | ICD-10-CM | POA: Diagnosis not present

## 2020-06-10 DIAGNOSIS — K760 Fatty (change of) liver, not elsewhere classified: Secondary | ICD-10-CM | POA: Diagnosis not present

## 2020-06-10 DIAGNOSIS — E890 Postprocedural hypothyroidism: Secondary | ICD-10-CM | POA: Diagnosis not present

## 2020-06-11 ENCOUNTER — Telehealth: Payer: Self-pay | Admitting: *Deleted

## 2020-06-11 DIAGNOSIS — M25551 Pain in right hip: Secondary | ICD-10-CM | POA: Diagnosis not present

## 2020-06-11 NOTE — Telephone Encounter (Signed)
Patient called stating that she sees GI at [facility]. Patient stated that she just saw a NP at [facility] and they did lab work. Patient stated that some of the labs were abnormal and she would like for Dr.Tower to review them and let her know what she recommends. Labs should be in Care Everywhere.

## 2020-06-11 NOTE — Telephone Encounter (Signed)
While I'm not sure I can see all labs that were done-nothing concerned me.  Her alk phos is slightly over reference range-that is common and can be hereditary or come from fatty liver   Do ask GI if she is still confused about anything

## 2020-06-12 DIAGNOSIS — J301 Allergic rhinitis due to pollen: Secondary | ICD-10-CM | POA: Diagnosis not present

## 2020-06-12 NOTE — Telephone Encounter (Signed)
Left VM requesting pt to call the office back 

## 2020-06-12 NOTE — Telephone Encounter (Signed)
Pt notified of Dr. Tower's comments and verbalized understanding  

## 2020-06-13 DIAGNOSIS — M25551 Pain in right hip: Secondary | ICD-10-CM | POA: Diagnosis not present

## 2020-06-17 DIAGNOSIS — E890 Postprocedural hypothyroidism: Secondary | ICD-10-CM | POA: Diagnosis not present

## 2020-06-17 DIAGNOSIS — M8580 Other specified disorders of bone density and structure, unspecified site: Secondary | ICD-10-CM | POA: Diagnosis not present

## 2020-06-17 DIAGNOSIS — Z8639 Personal history of other endocrine, nutritional and metabolic disease: Secondary | ICD-10-CM | POA: Diagnosis not present

## 2020-06-17 DIAGNOSIS — Z780 Asymptomatic menopausal state: Secondary | ICD-10-CM | POA: Diagnosis not present

## 2020-06-25 DIAGNOSIS — M25551 Pain in right hip: Secondary | ICD-10-CM | POA: Diagnosis not present

## 2020-06-26 DIAGNOSIS — J301 Allergic rhinitis due to pollen: Secondary | ICD-10-CM | POA: Diagnosis not present

## 2020-06-27 DIAGNOSIS — M25551 Pain in right hip: Secondary | ICD-10-CM | POA: Diagnosis not present

## 2020-06-28 DIAGNOSIS — J301 Allergic rhinitis due to pollen: Secondary | ICD-10-CM | POA: Diagnosis not present

## 2020-07-03 DIAGNOSIS — J301 Allergic rhinitis due to pollen: Secondary | ICD-10-CM | POA: Diagnosis not present

## 2020-07-08 DIAGNOSIS — M25551 Pain in right hip: Secondary | ICD-10-CM | POA: Diagnosis not present

## 2020-07-09 ENCOUNTER — Telehealth: Payer: Self-pay | Admitting: Family Medicine

## 2020-07-09 DIAGNOSIS — E890 Postprocedural hypothyroidism: Secondary | ICD-10-CM

## 2020-07-09 NOTE — Telephone Encounter (Signed)
Patient called.  Patient has been seeing Dr. Lavone Orn.  Dr.Solum never had openings and she would see her [employer]. Patient would like to be referred to a new endocrinologist, Dr.Jennifer Celine Ahr. Dr.Cannon's office told patient she would need a referral from her PCP.  Patient can go anytime.

## 2020-07-10 DIAGNOSIS — J301 Allergic rhinitis due to pollen: Secondary | ICD-10-CM | POA: Diagnosis not present

## 2020-07-10 NOTE — Telephone Encounter (Signed)
Noted  

## 2020-07-15 DIAGNOSIS — M25551 Pain in right hip: Secondary | ICD-10-CM | POA: Diagnosis not present

## 2020-07-17 DIAGNOSIS — J301 Allergic rhinitis due to pollen: Secondary | ICD-10-CM | POA: Diagnosis not present

## 2020-07-18 ENCOUNTER — Encounter: Payer: Self-pay | Admitting: General Surgery

## 2020-07-18 ENCOUNTER — Ambulatory Visit: Payer: PPO | Admitting: General Surgery

## 2020-07-18 ENCOUNTER — Other Ambulatory Visit
Admission: RE | Admit: 2020-07-18 | Discharge: 2020-07-18 | Disposition: A | Discharge status: Home / Self Care | Payer: PPO | Source: Ambulatory Visit | Attending: General Surgery | Admitting: General Surgery

## 2020-07-18 ENCOUNTER — Other Ambulatory Visit: Payer: Self-pay

## 2020-07-18 VITALS — BP 108/70 | HR 81 | Temp 98.0°F | Ht 65.0 in | Wt 137.8 lb

## 2020-07-18 DIAGNOSIS — E890 Postprocedural hypothyroidism: Secondary | ICD-10-CM | POA: Diagnosis not present

## 2020-07-18 LAB — TSH: TSH: 0.876 u[IU]/mL (ref 0.350–4.500)

## 2020-07-18 LAB — T4, FREE: Free T4: 1.47 ng/dL — ABNORMAL HIGH (ref 0.61–1.12)

## 2020-07-18 NOTE — Progress Notes (Signed)
Patient ID: Angela Bell, female   DOB: 1946-06-22, 74 y.o.   MRN: [id number]  Chief Complaint  Patient presents with  . New Patient (Initial Visit)    new pt ref Dr.Tower postablative hypothyroidism    HPI Angela Bell is a 74 y.o. female.  She has been referred by her primary care provider, Dr. Loura Pardon, for further management of post ablative hypothyroidism after treatment for Graves' disease.  She was followed in the past by Dr. Legrand Como Altheimer, in [place].  I have copied from one of his notes to provide some of the prior history:  " She first presented to Dr. Glori Bickers in August 2009 with complaints of swollen ankles, decreased energy, decreased exercise tolerance, some shortness of breath with speaking, and mild anxiety. Evaluation then revealed hyperthyroidism with TSH 0.02 (0.35-5.50), free T4 2.5 (0.6-1.6), and T3 uptake 49.1% (22.5-37.0%).  24-hour radioactive iodine uptake was elevated at 55.6% on 08/13/08. S/P radioactive iodine (131I) treatment 18.1 mCi on 08/16/08. She developed ablative hypothyroidism as expected by 11/09."  She was managed by him for a number of years, but due to travel time to his office, she was interested in being followed closer to home.  She was seen at the [facility] endocrinology group and was followed by Malissa Hippo, the nurse practitioner in that office.  According to Ms. Vanriper, she has felt frustrated that she has been unable to see one of the physicians in regards to her thyroid medication management.  She was interested in another opinion and sought referral to my office for further evaluation and management.  At her last visit to the [facility], her thyroid labs were checked and she was found to be under replaced with a TSH of 6.09.  Her brand-name Synthroid dose was increased to 88 mcg daily, from 75 mcg daily with an extra half tablet on Sundays, which is an average of 80 mcg daily over 1 week.   They, she denies any lumps  or masses in her neck.  She endorses occasional mild hoarseness but denies dysphagia.  She denies frequent throat clearing or any pressure in her neck while in a supine position.  She denies heat or cold intolerance.  She does endorse some heart palpitations, but denies feeling jittery or anxious.  No changes in the texture of her hair, skin, or fingernails, but she does endorse some hair loss, as well as slightly dry her skin.  She reports 3-4 bowel movements daily, which are generally loose.  She endorses a gritty sensation in her eyes, without tearing or photophobia.  Prior radioactive iodine exposure of 18.1 mCi in 2009.  No other exposure to ionizing radiation.  No family history of thyroid cancer or other thyroid problems.    Past Medical History:  Diagnosis Date  . Allergy    allergic rhinitis  . B12 deficiency   . Back pain    disc disease lumbar  . Colon polyp   . Dyspepsia   . Fatty liver   . GERD (gastroesophageal reflux disease)   . Graves' disease   . Hyperlipidemia   . Hyperthyroidism    Grave's disease (tx with radioactive I)  . Hypothyroidism   . Osteopenia   . Vitamin D deficiency   . Yeast vaginitis    recurrent- keeps terazol on hand / otc does not work    Past Surgical History:  Procedure Laterality Date  . BREAST SURGERY     breast reduction  . CARPAL TUNNEL  RELEASE    . CATARACT EXTRACTION, BILATERAL    . CESAREAN SECTION     x 2  . chin implant    . COLONOSCOPY    . COLONOSCOPY WITH PROPOFOL N/A 04/25/2018   Procedure: COLONOSCOPY WITH PROPOFOL;  Surgeon: Manya Silvas, MD;  Location: [facility] ENDOSCOPY;  Service: Endoscopy;  Laterality: N/A;  . ESOPHAGOGASTRODUODENOSCOPY (EGD) WITH PROPOFOL N/A 04/25/2018   Procedure: ESOPHAGOGASTRODUODENOSCOPY (EGD) WITH PROPOFOL;  Surgeon: Manya Silvas, MD;  Location: [facility] ENDOSCOPY;  Service: Endoscopy;  Laterality: N/A;  . EYE SURGERY     x3 for strabismus  . REDUCTION MAMMAPLASTY Bilateral 1983    Family  History  Problem Relation Age of Onset  . Osteoporosis Mother   . Heart disease Mother        mild heart problem  . Breast cancer Cousin   . Arthritis Father        RA    Social History Social History   Tobacco Use  . Smoking status: Former Smoker    Quit date: 11/23/1981    Years since quitting: 38.6  . Smokeless tobacco: Never Used  Vaping Use  . Vaping Use: Never used  Substance Use Topics  . Alcohol use: Yes    Alcohol/week: 0.0 standard drinks    Comment: rare  . Drug use: No    Allergies  Allergen Reactions  . Boniva [Ibandronic Acid]     Leg cramping   . Crestor [Rosuvastatin] Other (See Comments)    Joint/ Muscle Aches     Current Outpatient Medications  Medication Sig Dispense Refill  . ascorbic acid (VITAMIN C) 100 MG tablet Take by mouth.    . Calcium Carbonate-Vit D-Min 600-400 MG-UNIT TABS Take 2 tablets by mouth daily.      . Cholecalciferol (VITAMIN D3) 5000 UNITS CAPS Take 1 capsule by mouth daily.      . cimetidine (TAGAMET) 400 MG tablet cimetidine 400 mg tablet    . famotidine (PEPCID) 20 MG tablet Take 20 mg by mouth as needed for heartburn or indigestion.    . fexofenadine-pseudoephedrine (ALLEGRA-D 12 HOUR) 60-120 MG 12 hr tablet Take 1 tablet by mouth 2 (two) times daily as needed. 60 tablet 11  . Fluticasone Propionate (FLONASE NA) Place into the nose as needed.    . hydrocortisone 1 % ointment APPLY EXTERNALLY TO HEMORRHOIDS ONCE DAILY AS NEEDED 28 g 1  . Liver Extract (LIVER PO) Take 1 tablet by mouth every other day.     Marland Kitchen MISC NATURAL PRODUCTS IJ Inject as directed. Allergy Injection    . Multiple Vitamin (MULTIVITAMIN) tablet Take 2 times a week    . Omega-3 Fatty Acids (FISH OIL PO) Take 1,500 mg by mouth daily.    Marland Kitchen SYNTHROID 88 MCG tablet Take by mouth daily.    . valACYclovir (VALTREX) 1000 MG tablet Take 2 pills by mouth twice daily for one day for a cold sore 12 tablet 3  . fexofenadine (ALLEGRA) 180 MG tablet Take 180 mg by mouth  daily. (Patient not taking: Reported on 07/18/2020)     No current facility-administered medications for this visit.    Review of Systems Review of Systems  Respiratory:       Orthopnea  Cardiovascular: Positive for leg swelling.       She endorses cramping pain in her legs as well.  Gastrointestinal: Positive for diarrhea.       Heartburn  All other systems reviewed and are negative. Or as discussed  in the history of present illness.  Blood pressure 108/70, pulse 81, temperature 98 F (36.7 C), temperature source Oral, height 5\' 5"  (1.651 m), weight 137 lb 12.8 oz (62.5 kg), SpO2 98 %. Body mass index is 22.93 kg/m.  Physical Exam Physical Exam Constitutional:      General: She is not in acute distress.    Appearance: Normal appearance. She is normal weight.  HENT:     Head: Normocephalic and atraumatic.     Nose:     Comments: Covered with a mask    Mouth/Throat:     Comments: Covered with a mask Eyes:     General: No scleral icterus.       Right eye: No discharge.        Left eye: No discharge.     Comments: No proptosis or exophthalmos.  No periorbital edema.  No lid lag or stare.  Neck:     Comments: Thyroid gland is not palpable, consistent with her history of prior ablation.  There is no palpable cervical or supraclavicular lymphadenopathy.  The trachea is midline. Cardiovascular:     Rate and Rhythm: Normal rate and regular rhythm.     Pulses: Normal pulses.  Pulmonary:     Effort: Pulmonary effort is normal.     Breath sounds: Normal breath sounds.  Abdominal:     General: Bowel sounds are normal.     Palpations: Abdomen is soft.  Genitourinary:    Comments: Deferred Musculoskeletal:        General: No swelling or deformity.  Skin:    General: Skin is warm and dry.  Neurological:     General: No focal deficit present.     Mental Status: She is alert and oriented to person, place, and time.  Psychiatric:        Mood and Affect: Mood normal.         Behavior: Behavior normal.     Data Reviewed I reviewed notes from Dr. Altheimer's office from May 03, 2017, November 05, 2017, Apr 20, 2018, September 20, 2018, all of which discussed her thyroid hormone replacement and include the relevant labs and any adjustments made to her thyroid hormone replacement.  I reviewed several notes from the [facility] clinic endocrinology group, including her initial consult on March 01, 2019, and subsequent visits on June 14, 2019, September 11, 2019, December 18, 2019, and June 17, 2020.  Similar to the notes from Dr. Darryl Nestle office, these notes discuss her history with Graves' disease and radioactive iodine ablation, as well as her symptoms related to hyper or hypothyroidism, her labs, and any adjustments made to her thyroid hormone replacement.  06/10/2020 12/11/2019 09/04/2019 07/24/2019 06/07/2019 03/13/2019         Thyroid Stimulating Hormone (TSH) 6.090High    3.278 1.985 1.246    The above labs are approximately 1 years worth of TSH values.  Up until the most recent, these were within normal range.  The most recent shows an elevation in TSH and her thyroid hormone replacement was appropriately adjusted.  The last free T4 I was able to localize for her was from May 03, 2017 and was within normal range at 1.2.  No imaging results are available.  Assessment This is a 74 year old woman with a history of Graves' disease, status post radioactive iodine ablation.  She was interested in another opinion regarding management of her post ablative hypothyroidism.  Plan I discussed with her that I am not an endocrinologist, rather a  surgeon who specializes in endocrine procedures.  However, I am very comfortable with monitoring and managing thyroid hormone levels and told her I would be happy to try and get her dose adjusted to the point where she felt well, was not having symptoms of hyper or hypothyroidism, and was on a stable dose for some period of time.   Once that occurs, I think she can safely be managed by her primary care provider.  If Dr. Edsel Petrin ever has any questions regarding the dose of thyroid hormone replacement, any additional labs or studies that might be indicated, etc.,  I am more than happy to assist.  Today, I ordered a full panel of thyroid labs.  Once I have these results, I will communicate with Ms. Kopp and let her know what adjustments might need to be made.    Fredirick Maudlin 07/18/2020, 9:35 AM

## 2020-07-18 NOTE — Patient Instructions (Addendum)
Dr.Cannon suggests patient to have lab work done after today's visit. Dr.Cannon discussed with patient once lab results are completed, she would reach out to patient via [place].  Hypothyroidism  Hypothyroidism is when the thyroid gland does not make enough of certain hormones (it is underactive). The thyroid gland is a small gland located in the lower front part of the neck, just in front of the windpipe (trachea). This gland makes hormones that help control how the body uses food for energy (metabolism) as well as how the heart and brain function. These hormones also play a role in keeping your bones strong. When the thyroid is underactive, it produces too little of the hormones thyroxine (T4) and triiodothyronine (T3). What are the causes? This condition may be caused by:  Hashimoto's disease. This is a disease in which the body's disease-fighting system (immune system) attacks the thyroid gland. This is the most common cause.  Viral infections.  Pregnancy.  Certain medicines.  Birth defects.  Past radiation treatments to the head or neck for cancer.  Past treatment with radioactive iodine.  Past exposure to radiation in the environment.  Past surgical removal of part or all of the thyroid.  Problems with a gland in the center of the brain (pituitary gland).  Lack of enough iodine in the diet. What increases the risk? You are more likely to develop this condition if:  You are female.  You have a family history of thyroid conditions.  You use a medicine called lithium.  You take medicines that affect the immune system (immunosuppressants). What are the signs or symptoms? Symptoms of this condition include:  Feeling as though you have no energy (lethargy).  Not being able to tolerate cold.  Weight gain that is not explained by a change in diet or exercise habits.  Lack of appetite.  Dry skin.  Coarse hair.  Menstrual irregularity.  Slowing of thought  processes.  Constipation.  Sadness or depression. How is this diagnosed? This condition may be diagnosed based on:  Your symptoms, your medical history, and a physical exam.  Blood tests. You may also have imaging tests, such as an ultrasound or MRI. How is this treated? This condition is treated with medicine that replaces the thyroid hormones that your body does not make. After you begin treatment, it may take several weeks for symptoms to go away. Follow these instructions at home:  Take over-the-counter and prescription medicines only as told by your health care provider.  If you start taking any new medicines, tell your health care provider.  Keep all follow-up visits as told by your health care provider. This is important. ? As your condition improves, your dosage of thyroid hormone medicine may change. ? You will need to have blood tests regularly so that your health care provider can monitor your condition. Contact a health care provider if:  Your symptoms do not get better with treatment.  You are taking thyroid replacement medicine and you: ? Sweat a lot. ? Have tremors. ? Feel anxious. ? Lose weight rapidly. ? Cannot tolerate heat. ? Have emotional swings. ? Have diarrhea. ? Feel weak. Get help right away if you have:  Chest pain.  An irregular heartbeat.  A rapid heartbeat.  Difficulty breathing. Summary  Hypothyroidism is when the thyroid gland does not make enough of certain hormones (it is underactive).  When the thyroid is underactive, it produces too little of the hormones thyroxine (T4) and triiodothyronine (T3).  The most common cause is  Hashimoto's disease, a disease in which the body's disease-fighting system (immune system) attacks the thyroid gland. The condition can also be caused by viral infections, medicine, pregnancy, or past radiation treatment to the head or neck.  Symptoms may include weight gain, dry skin, constipation, feeling as  though you do not have energy, and not being able to tolerate cold.  This condition is treated with medicine to replace the thyroid hormones that your body does not make. This information is not intended to replace advice given to you by your health care provider. Make sure you discuss any questions you have with your health care provider. Document Revised: 10/22/2017 Document Reviewed: 10/20/2017 Elsevier Patient Education  2020 [employer].

## 2020-07-19 LAB — THYROID STIMULATING IMMUNOGLOBULIN: Thyroid Stimulating Immunoglob: <0.1 IU/L (ref 0.00–0.55)

## 2020-07-19 LAB — T3, FREE: T3, Free: 2.8 pg/mL (ref 2.0–4.4)

## 2020-07-19 LAB — THYROID PEROXIDASE ANTIBODY: Thyroperoxidase Ab SerPl-aCnc: 74 IU/mL — ABNORMAL HIGH (ref 0–34)

## 2020-07-22 ENCOUNTER — Encounter: Payer: Self-pay | Admitting: General Surgery

## 2020-07-22 DIAGNOSIS — E890 Postprocedural hypothyroidism: Secondary | ICD-10-CM

## 2020-07-22 NOTE — Telephone Encounter (Signed)
-----   Message from Fredirick Maudlin, MD sent at 07/22/2020  3:53 PM EDT ----- Regarding: thyroid labs in 6 weeks Please order a TSH, Ft4 and Ft3 to be done in 6 weeks. Thanks! --Lavella Hammock

## 2020-07-24 DIAGNOSIS — J301 Allergic rhinitis due to pollen: Secondary | ICD-10-CM | POA: Diagnosis not present

## 2020-07-30 DIAGNOSIS — M25551 Pain in right hip: Secondary | ICD-10-CM | POA: Diagnosis not present

## 2020-07-31 DIAGNOSIS — J301 Allergic rhinitis due to pollen: Secondary | ICD-10-CM | POA: Diagnosis not present

## 2020-08-07 DIAGNOSIS — J301 Allergic rhinitis due to pollen: Secondary | ICD-10-CM | POA: Diagnosis not present

## 2020-08-08 ENCOUNTER — Other Ambulatory Visit: Payer: Self-pay

## 2020-08-08 ENCOUNTER — Ambulatory Visit: Payer: PPO | Admitting: Dermatology

## 2020-08-08 DIAGNOSIS — L821 Other seborrheic keratosis: Secondary | ICD-10-CM | POA: Diagnosis not present

## 2020-08-08 DIAGNOSIS — D229 Melanocytic nevi, unspecified: Secondary | ICD-10-CM

## 2020-08-08 DIAGNOSIS — L578 Other skin changes due to chronic exposure to nonionizing radiation: Secondary | ICD-10-CM | POA: Diagnosis not present

## 2020-08-08 DIAGNOSIS — L820 Inflamed seborrheic keratosis: Secondary | ICD-10-CM

## 2020-08-08 DIAGNOSIS — L570 Actinic keratosis: Secondary | ICD-10-CM

## 2020-08-08 DIAGNOSIS — L988 Other specified disorders of the skin and subcutaneous tissue: Secondary | ICD-10-CM

## 2020-08-08 DIAGNOSIS — D485 Neoplasm of uncertain behavior of skin: Secondary | ICD-10-CM

## 2020-08-08 DIAGNOSIS — Z1283 Encounter for screening for malignant neoplasm of skin: Secondary | ICD-10-CM | POA: Diagnosis not present

## 2020-08-08 DIAGNOSIS — L814 Other melanin hyperpigmentation: Secondary | ICD-10-CM | POA: Diagnosis not present

## 2020-08-08 NOTE — Progress Notes (Signed)
New Patient Visit  Subjective  Angela Bell is a 74 y.o. female who presents for the following: full body skin exam and skin cancer screening  No personal or family history of skin cancer.    Patient advises she does have some crusty places on her legs that came up after soaking in epsom salts. Present for about 6 months.  Some of them have been itchy and irritated.  She has not had any treatment.  The following portions of the chart were reviewed this encounter and updated as appropriate:  Tobacco  Allergies  Meds  Problems  Med Hx  Surg Hx  Fam Hx      Review of Systems:  No other skin or systemic complaints except as noted in HPI or Assessment and Plan.  Objective  Well appearing patient in no apparent distress; mood and affect are within normal limits.  A full examination was performed including scalp, head, eyes, ears, nose, lips, neck, chest, axillae, abdomen, back, buttocks, bilateral upper extremities, bilateral lower extremities, hands, feet, fingers, toes, fingernails, and toenails. All findings within normal limits unless otherwise noted below.  Objective  L lateral calf x 1, R popliteal fossa x 1, R ant thigh x 1, L post thigh x 1, R upper eyelid x 2 (6): Erythematous keratotic or waxy stuck-on papule or plaque.   Objective  Left Posterior Neck: 4.0 x 3.5cm soft nodule  Objective  R lateral brow: Erythematous thin papules/macules with gritty scale.   Objective  Lips, lower face, frown complex: Rhytides and volume loss.    Assessment & Plan  Inflamed seborrheic keratosis (6) L lateral calf x 1, R popliteal fossa x 1, R ant thigh x 1, L post thigh x 1, R upper eyelid x 2  Prior to procedure, discussed risks of blister formation, small wound, skin dyspigmentation, or rare scar following cryotherapy.    Destruction of lesion - L lateral calf x 1, R popliteal fossa x 1, R ant thigh x 1, L post thigh x 1, R upper eyelid x 2 Complexity: simple     Destruction method: cryotherapy   Informed consent: discussed and consent obtained   Lesion destroyed using liquid nitrogen: Yes   Cryotherapy cycles:  2 Outcome: patient tolerated procedure well with no complications   Post-procedure details: wound care instructions given    Neoplasm of uncertain behavior of skin Left Posterior Neck  Favor Lipoma  Will refer to general surgery, Dr. Fleet Contras, for evaluation and to consider treatment  AK (actinic keratosis) R lateral brow  Prior to procedure, discussed risks of blister formation, small wound, skin dyspigmentation, or rare scar following cryotherapy.  Destruction of lesion - R lateral brow  Destruction method: cryotherapy   Informed consent: discussed and consent obtained   Lesion destroyed using liquid nitrogen: Yes   Cryotherapy cycles:  2 Outcome: patient tolerated procedure well with no complications   Post-procedure details: wound care instructions given    Elastosis of skin Lips, lower face, frown complex  Discussed Botox and Restylane. Will request records from Dr. Woodfin Ganja in [place] - Scattered tan macules - Discussed due to sun exposure - Benign, observe - Call for any changes  Seborrheic Keratoses - Stuck-on, waxy, tan-brown papules and plaques  - Discussed benign etiology and prognosis. - Observe - Call for any changes  Melanocytic Nevi - Tan-brown and/or pink-flesh-colored symmetric macules and papules - Benign appearing on exam today - Observation - Call clinic for new or  changing moles - Recommend daily use of broad spectrum spf 30+ sunscreen to sun-exposed areas.   Actinic Damage - diffuse scaly erythematous macules with underlying dyspigmentation - Recommend daily broad spectrum sunscreen SPF 30+ to sun-exposed areas, reapply every 2 hours as needed.  - Call for new or changing lesions.  Skin cancer screening performed today.   Return in about 2 months (around 10/08/2020) for AK  follow up, Botox/Fillers.  Graciella Belton, RMA, am acting as scribe for Forest Gleason, MD .  Documentation: I have reviewed the above documentation for accuracy and completeness, and I agree with the above.  Forest Gleason, MD

## 2020-08-08 NOTE — Patient Instructions (Signed)
Melanoma ABCDEs  Melanoma is the most dangerous type of skin cancer, and is the leading cause of death from skin disease.  You are more likely to develop melanoma if you:  Have light-colored skin, light-colored eyes, or red or blond hair  Spend a lot of time in the sun  Tan regularly, either outdoors or in a tanning bed  Have had blistering sunburns, especially during childhood  Have a close family member who has had a melanoma  Have atypical moles or large birthmarks  Early detection of melanoma is key since treatment is typically straightforward and cure rates are extremely high if we catch it early.   The first sign of melanoma is often a change in a mole or a new dark spot.  The ABCDE system is a way of remembering the signs of melanoma.  A for asymmetry:  The two halves do not match. B for border:  The edges of the growth are irregular. C for color:  A mixture of colors are present instead of an even brown color. D for diameter:  Melanomas are usually (but not always) greater than 6mm - the size of a pencil eraser. E for evolution:  The spot keeps changing in size, shape, and color.  Please check your skin once per month between visits. You can use a small mirror in front and a large mirror behind you to keep an eye on the back side or your body.   If you see any new or changing lesions before your next follow-up, please call to schedule a visit.  Please continue daily skin protection including broad spectrum sunscreen SPF 30+ to sun-exposed areas, reapplying every 2 hours as needed when you're outdoors.   Cryotherapy Aftercare  . Wash gently with soap and water everyday.   . Apply Vaseline and Band-Aid daily until healed.  Prior to procedure, discussed risks of blister formation, small wound, skin dyspigmentation, or rare scar following cryotherapy.   

## 2020-08-09 ENCOUNTER — Encounter: Payer: Self-pay | Admitting: Dermatology

## 2020-08-14 DIAGNOSIS — J301 Allergic rhinitis due to pollen: Secondary | ICD-10-CM | POA: Diagnosis not present

## 2020-08-19 ENCOUNTER — Other Ambulatory Visit: Payer: Self-pay

## 2020-08-19 DIAGNOSIS — D485 Neoplasm of uncertain behavior of skin: Secondary | ICD-10-CM

## 2020-08-21 DIAGNOSIS — J301 Allergic rhinitis due to pollen: Secondary | ICD-10-CM | POA: Diagnosis not present

## 2020-08-22 ENCOUNTER — Other Ambulatory Visit: Payer: Self-pay

## 2020-08-22 ENCOUNTER — Ambulatory Visit (INDEPENDENT_AMBULATORY_CARE_PROVIDER_SITE_OTHER): Payer: PPO

## 2020-08-22 DIAGNOSIS — Z23 Encounter for immunization: Secondary | ICD-10-CM | POA: Diagnosis not present

## 2020-08-28 DIAGNOSIS — J301 Allergic rhinitis due to pollen: Secondary | ICD-10-CM | POA: Diagnosis not present

## 2020-08-29 ENCOUNTER — Other Ambulatory Visit
Admission: RE | Admit: 2020-08-29 | Discharge: 2020-08-29 | Disposition: A | Discharge status: Home / Self Care | Payer: PPO | Attending: General Surgery | Admitting: General Surgery

## 2020-08-29 DIAGNOSIS — E890 Postprocedural hypothyroidism: Secondary | ICD-10-CM | POA: Insufficient documentation

## 2020-08-29 LAB — T4, FREE: Free T4: 1.07 ng/dL (ref 0.61–1.12)

## 2020-08-29 LAB — TSH: TSH: 1.427 u[IU]/mL (ref 0.350–4.500)

## 2020-08-30 LAB — T3, FREE: T3, Free: 2.5 pg/mL (ref 2.0–4.4)

## 2020-09-04 ENCOUNTER — Encounter: Payer: Self-pay | Admitting: General Surgery

## 2020-09-04 DIAGNOSIS — J301 Allergic rhinitis due to pollen: Secondary | ICD-10-CM | POA: Diagnosis not present

## 2020-09-11 DIAGNOSIS — J301 Allergic rhinitis due to pollen: Secondary | ICD-10-CM | POA: Diagnosis not present

## 2020-09-18 DIAGNOSIS — J301 Allergic rhinitis due to pollen: Secondary | ICD-10-CM | POA: Diagnosis not present

## 2020-09-24 DIAGNOSIS — J301 Allergic rhinitis due to pollen: Secondary | ICD-10-CM | POA: Diagnosis not present

## 2020-09-25 DIAGNOSIS — J301 Allergic rhinitis due to pollen: Secondary | ICD-10-CM | POA: Diagnosis not present

## 2020-10-02 DIAGNOSIS — J301 Allergic rhinitis due to pollen: Secondary | ICD-10-CM | POA: Diagnosis not present

## 2020-10-03 DIAGNOSIS — D170 Benign lipomatous neoplasm of skin and subcutaneous tissue of head, face and neck: Secondary | ICD-10-CM | POA: Diagnosis not present

## 2020-10-09 DIAGNOSIS — J301 Allergic rhinitis due to pollen: Secondary | ICD-10-CM | POA: Diagnosis not present

## 2020-10-10 ENCOUNTER — Ambulatory Visit: Payer: PPO | Admitting: Dermatology

## 2020-10-12 IMAGING — DX ABDOMEN - 1 VIEW
2 series · 2 of 2 positions shown · non-contrast
Comparison: None.

CLINICAL DATA: Left lower quadrant pain, bowel obstruction

EXAM:
ABDOMEN - 1 VIEW

[abdomen kub (1 of 2)]
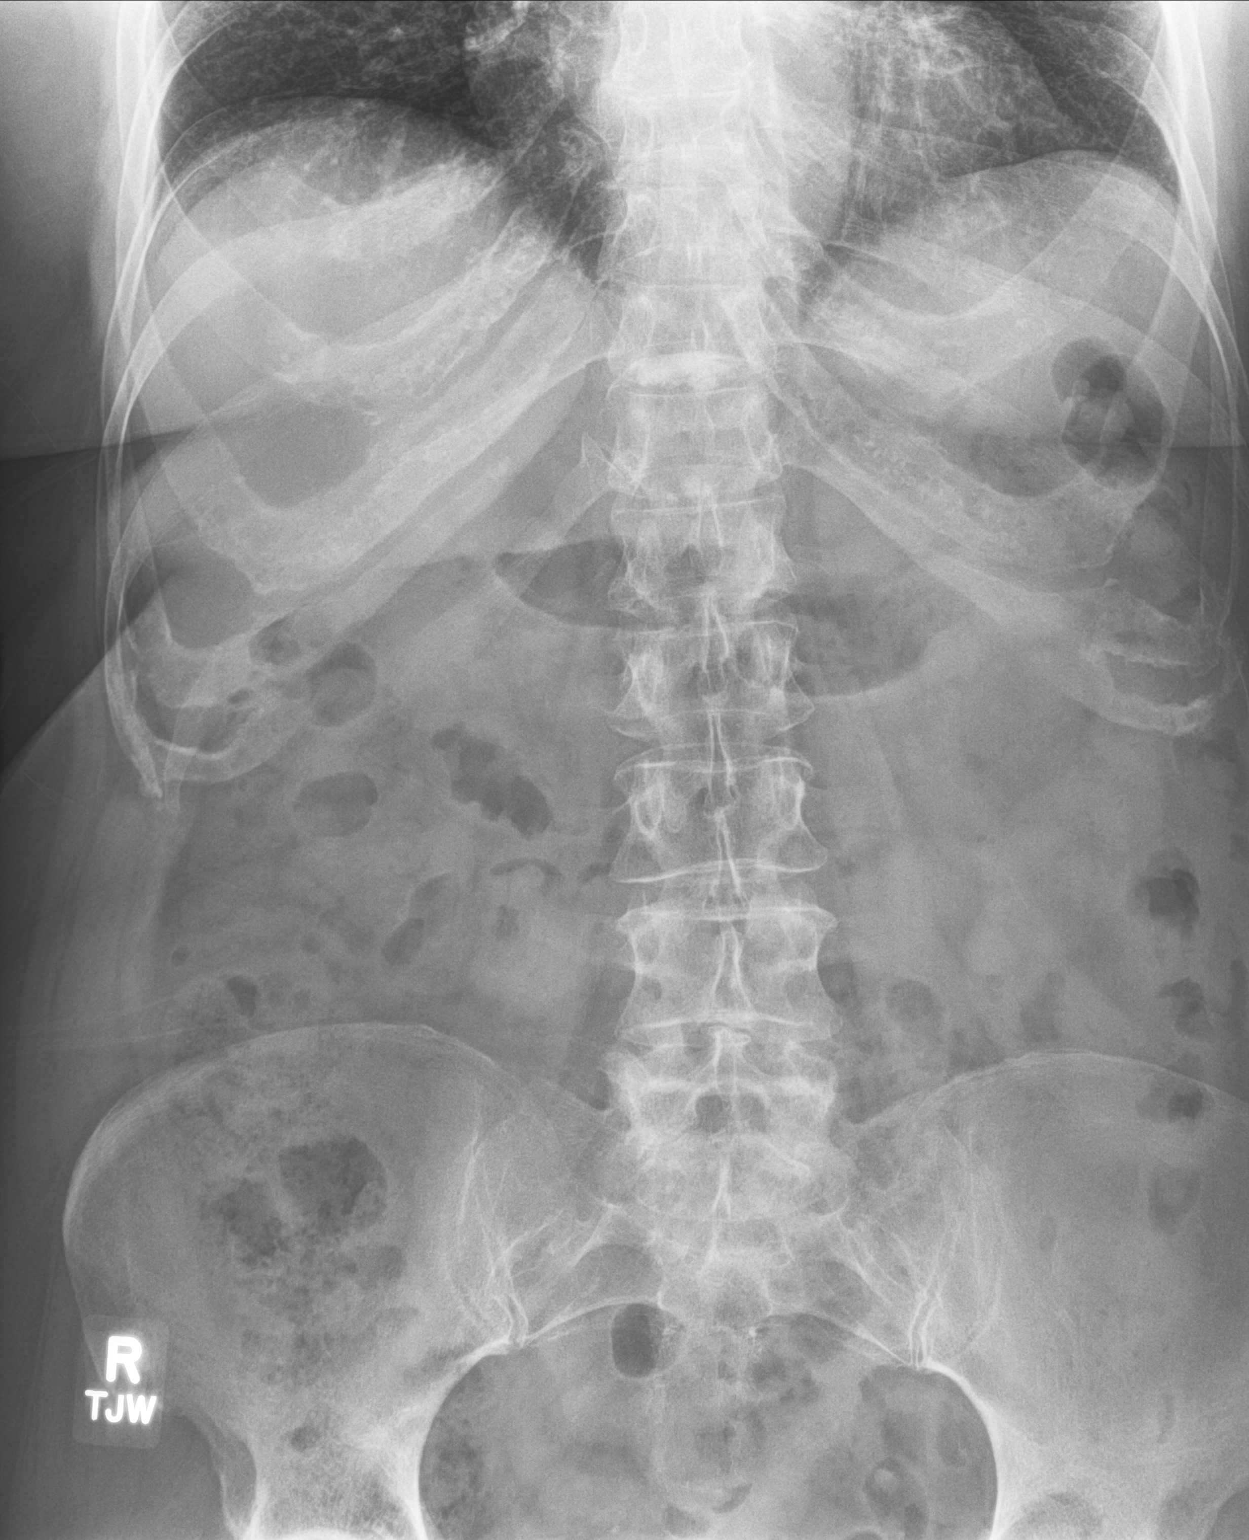

[abdomen kub (2 of 2)]
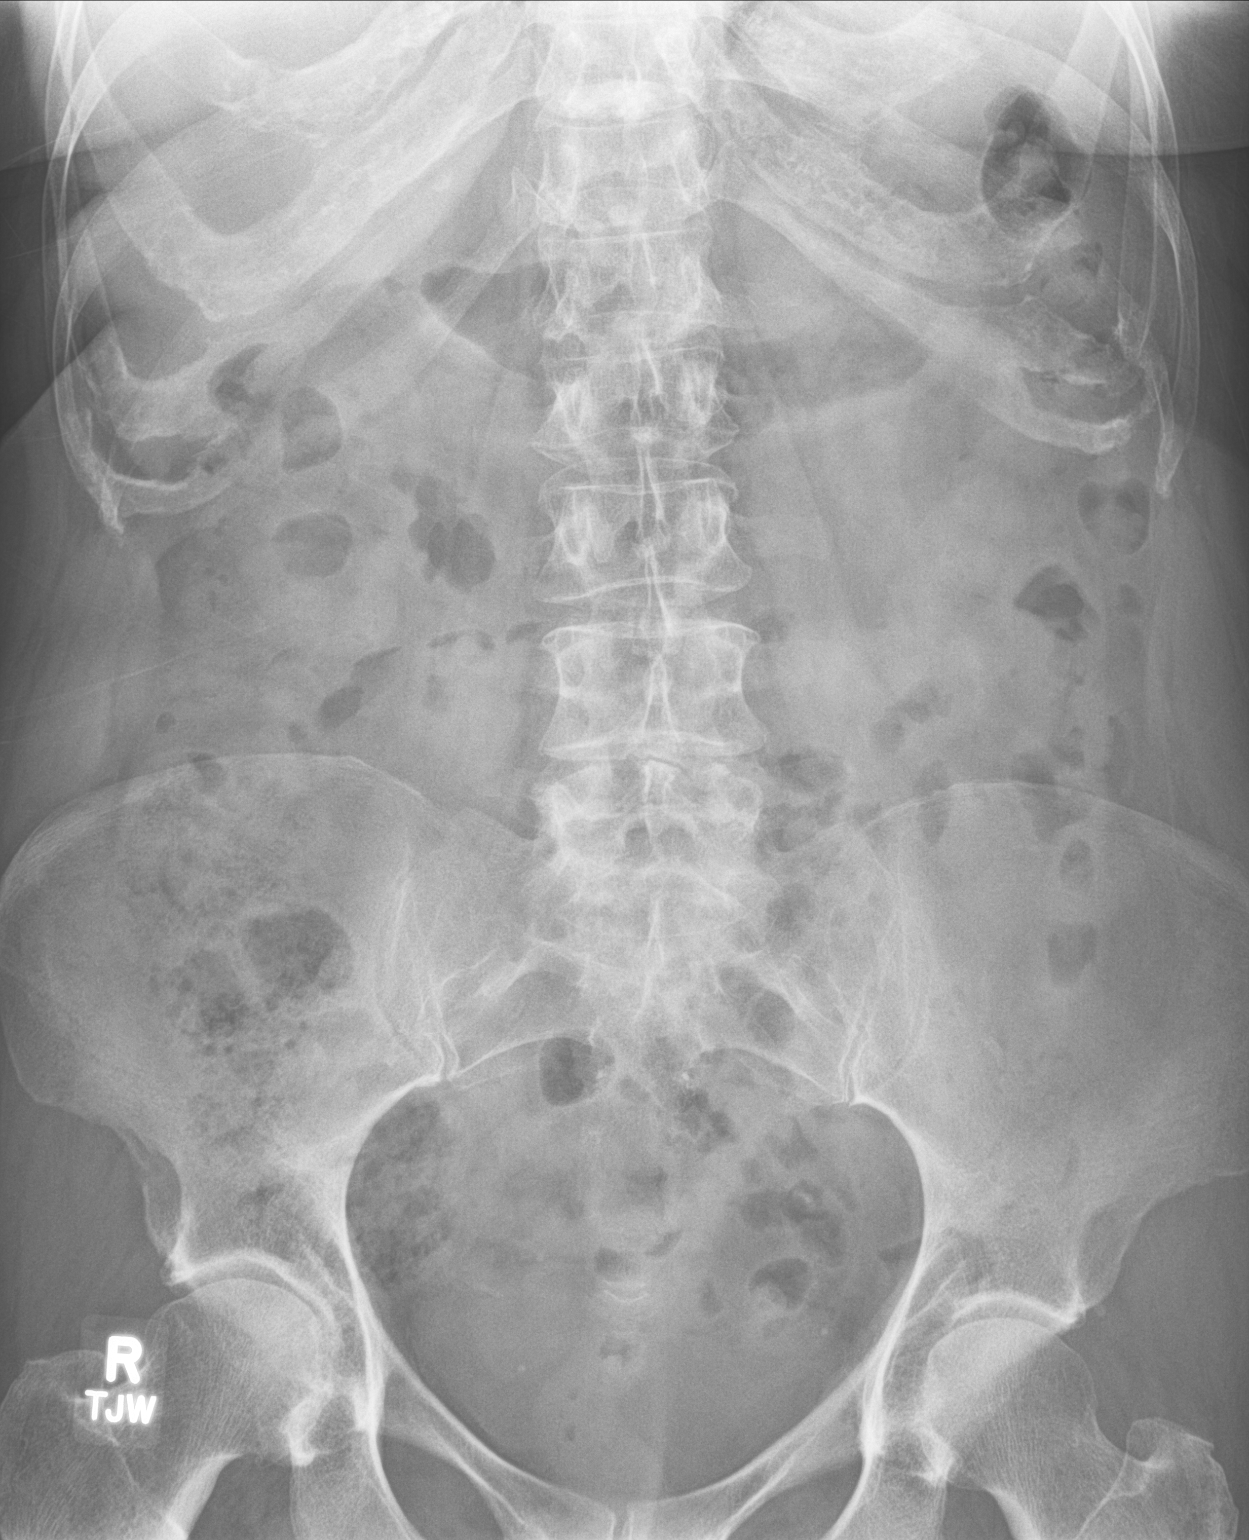

[2 of 2 positions shown; findings below may reference images not displayed]

FINDINGS: Nonobstructive bowel gas pattern.

Visualized osseous structures are within normal limits.
IMPRESSION: Unremarkable abdominal radiograph.

## 2020-10-16 DIAGNOSIS — J301 Allergic rhinitis due to pollen: Secondary | ICD-10-CM | POA: Diagnosis not present

## 2020-10-23 DIAGNOSIS — J301 Allergic rhinitis due to pollen: Secondary | ICD-10-CM | POA: Diagnosis not present

## 2020-10-30 DIAGNOSIS — J301 Allergic rhinitis due to pollen: Secondary | ICD-10-CM | POA: Diagnosis not present

## 2020-10-31 ENCOUNTER — Other Ambulatory Visit: Payer: Self-pay | Admitting: General Surgery

## 2020-10-31 DIAGNOSIS — D170 Benign lipomatous neoplasm of skin and subcutaneous tissue of head, face and neck: Secondary | ICD-10-CM | POA: Diagnosis not present

## 2020-11-04 LAB — SURGICAL PATHOLOGY

## 2020-11-06 DIAGNOSIS — J301 Allergic rhinitis due to pollen: Secondary | ICD-10-CM | POA: Diagnosis not present

## 2020-11-08 DIAGNOSIS — H524 Presbyopia: Secondary | ICD-10-CM | POA: Diagnosis not present

## 2020-11-08 DIAGNOSIS — H04123 Dry eye syndrome of bilateral lacrimal glands: Secondary | ICD-10-CM | POA: Diagnosis not present

## 2020-11-08 DIAGNOSIS — H26493 Other secondary cataract, bilateral: Secondary | ICD-10-CM | POA: Diagnosis not present

## 2020-11-08 DIAGNOSIS — H52203 Unspecified astigmatism, bilateral: Secondary | ICD-10-CM | POA: Diagnosis not present

## 2020-11-20 DIAGNOSIS — J301 Allergic rhinitis due to pollen: Secondary | ICD-10-CM | POA: Diagnosis not present

## 2020-11-27 DIAGNOSIS — J301 Allergic rhinitis due to pollen: Secondary | ICD-10-CM | POA: Diagnosis not present

## 2020-12-04 DIAGNOSIS — J301 Allergic rhinitis due to pollen: Secondary | ICD-10-CM | POA: Diagnosis not present

## 2020-12-16 DIAGNOSIS — M13831 Other specified arthritis, right wrist: Secondary | ICD-10-CM | POA: Diagnosis not present

## 2020-12-16 DIAGNOSIS — M1811 Unilateral primary osteoarthritis of first carpometacarpal joint, right hand: Secondary | ICD-10-CM | POA: Diagnosis not present

## 2020-12-19 ENCOUNTER — Telehealth: Payer: Self-pay

## 2020-12-19 DIAGNOSIS — K760 Fatty (change of) liver, not elsewhere classified: Secondary | ICD-10-CM

## 2020-12-19 DIAGNOSIS — E890 Postprocedural hypothyroidism: Secondary | ICD-10-CM

## 2020-12-19 DIAGNOSIS — E559 Vitamin D deficiency, unspecified: Secondary | ICD-10-CM

## 2020-12-19 DIAGNOSIS — E538 Deficiency of other specified B group vitamins: Secondary | ICD-10-CM

## 2020-12-19 DIAGNOSIS — R7309 Other abnormal glucose: Secondary | ICD-10-CM

## 2020-12-19 DIAGNOSIS — Z0000 Encounter for general adult medical examination without abnormal findings: Secondary | ICD-10-CM

## 2020-12-19 DIAGNOSIS — E7800 Pure hypercholesterolemia, unspecified: Secondary | ICD-10-CM

## 2020-12-19 NOTE — Telephone Encounter (Signed)
Pt states she has lab visit, for [facility] wellness, on 12/30/20.  She's requesting her T3 and T4 be added to lab order.  Pt does not have an endocrinologist right now.  Plz advise at [phone].

## 2020-12-19 NOTE — Telephone Encounter (Signed)
I put the orders in 

## 2020-12-19 NOTE — Addendum Note (Signed)
Addended by: Loura Pardon A on: 12/19/2020 12:35 PM   Modules accepted: Orders

## 2020-12-20 DIAGNOSIS — J301 Allergic rhinitis due to pollen: Secondary | ICD-10-CM | POA: Diagnosis not present

## 2020-12-25 ENCOUNTER — Encounter: Payer: Self-pay | Admitting: Family Medicine

## 2020-12-29 ENCOUNTER — Telehealth: Payer: Self-pay | Admitting: Family Medicine

## 2020-12-29 DIAGNOSIS — R000 Tachycardia, unspecified: Secondary | ICD-10-CM

## 2020-12-29 DIAGNOSIS — E890 Postprocedural hypothyroidism: Secondary | ICD-10-CM

## 2020-12-29 DIAGNOSIS — E538 Deficiency of other specified B group vitamins: Secondary | ICD-10-CM

## 2020-12-29 DIAGNOSIS — R7309 Other abnormal glucose: Secondary | ICD-10-CM

## 2020-12-29 DIAGNOSIS — E7800 Pure hypercholesterolemia, unspecified: Secondary | ICD-10-CM

## 2020-12-29 DIAGNOSIS — Z0000 Encounter for general adult medical examination without abnormal findings: Secondary | ICD-10-CM

## 2020-12-29 DIAGNOSIS — E559 Vitamin D deficiency, unspecified: Secondary | ICD-10-CM

## 2020-12-29 DIAGNOSIS — K760 Fatty (change of) liver, not elsewhere classified: Secondary | ICD-10-CM

## 2020-12-29 NOTE — Telephone Encounter (Signed)
-----   Message from Ellamae Sia sent at 12/16/2020 10:25 AM EST ----- Regarding: Lab orders for Monday, 2.7.22 Patient is scheduled for CPX labs, please order future labs, Thanks , Karna Christmas

## 2020-12-30 ENCOUNTER — Encounter: Payer: Self-pay | Admitting: Family Medicine

## 2020-12-30 ENCOUNTER — Other Ambulatory Visit: Payer: Self-pay

## 2020-12-30 ENCOUNTER — Ambulatory Visit (INDEPENDENT_AMBULATORY_CARE_PROVIDER_SITE_OTHER): Payer: PPO | Admitting: Family Medicine

## 2020-12-30 ENCOUNTER — Telehealth: Payer: Self-pay

## 2020-12-30 ENCOUNTER — Other Ambulatory Visit (INDEPENDENT_AMBULATORY_CARE_PROVIDER_SITE_OTHER): Payer: PPO

## 2020-12-30 VITALS — BP 106/62 | HR 72 | Temp 97.5°F | Ht 65.0 in | Wt 140.4 lb

## 2020-12-30 DIAGNOSIS — S8001XA Contusion of right knee, initial encounter: Secondary | ICD-10-CM | POA: Diagnosis not present

## 2020-12-30 DIAGNOSIS — K760 Fatty (change of) liver, not elsewhere classified: Secondary | ICD-10-CM | POA: Diagnosis not present

## 2020-12-30 DIAGNOSIS — E559 Vitamin D deficiency, unspecified: Secondary | ICD-10-CM

## 2020-12-30 DIAGNOSIS — S0003XA Contusion of scalp, initial encounter: Secondary | ICD-10-CM | POA: Diagnosis not present

## 2020-12-30 DIAGNOSIS — W19XXXA Unspecified fall, initial encounter: Secondary | ICD-10-CM | POA: Insufficient documentation

## 2020-12-30 DIAGNOSIS — R000 Tachycardia, unspecified: Secondary | ICD-10-CM

## 2020-12-30 DIAGNOSIS — E890 Postprocedural hypothyroidism: Secondary | ICD-10-CM

## 2020-12-30 DIAGNOSIS — E7800 Pure hypercholesterolemia, unspecified: Secondary | ICD-10-CM | POA: Diagnosis not present

## 2020-12-30 DIAGNOSIS — R7309 Other abnormal glucose: Secondary | ICD-10-CM | POA: Diagnosis not present

## 2020-12-30 DIAGNOSIS — Z0000 Encounter for general adult medical examination without abnormal findings: Secondary | ICD-10-CM

## 2020-12-30 DIAGNOSIS — E538 Deficiency of other specified B group vitamins: Secondary | ICD-10-CM | POA: Diagnosis not present

## 2020-12-30 DIAGNOSIS — S0093XA Contusion of unspecified part of head, initial encounter: Secondary | ICD-10-CM | POA: Insufficient documentation

## 2020-12-30 LAB — COMPREHENSIVE METABOLIC PANEL
ALT: 12 U/L (ref 0–35)
AST: 15 U/L (ref 0–37)
Albumin: 4 g/dL (ref 3.5–5.2)
Alkaline Phosphatase: 118 U/L — ABNORMAL HIGH (ref 39–117)
BUN: 12 mg/dL (ref 6–23)
CO2: 27 mEq/L (ref 19–32)
Calcium: 9.5 mg/dL (ref 8.4–10.5)
Chloride: 105 mEq/L (ref 96–112)
Creatinine, Ser: 0.74 mg/dL (ref 0.40–1.20)
GFR: 80.86 mL/min (ref >60.00)
Glucose, Bld: 87 mg/dL (ref 70–99)
Potassium: 4.2 mEq/L (ref 3.5–5.1)
Sodium: 139 mEq/L (ref 135–145)
Total Bilirubin: 0.9 mg/dL (ref 0.2–1.2)
Total Protein: 6.6 g/dL (ref 6.0–8.3)

## 2020-12-30 LAB — LIPID PANEL
Cholesterol: 203 mg/dL — ABNORMAL HIGH (ref 0–200)
HDL: 57.2 mg/dL (ref >39.00)
LDL Cholesterol: 125 mg/dL — ABNORMAL HIGH (ref 0–99)
NonHDL: 146.12
Total CHOL/HDL Ratio: 4
Triglycerides: 105 mg/dL (ref 0.0–149.0)
VLDL: 21 mg/dL (ref 0.0–40.0)

## 2020-12-30 LAB — HEMOGLOBIN A1C: Hgb A1c MFr Bld: 5.7 % (ref 4.6–6.5)

## 2020-12-30 LAB — CBC WITH DIFFERENTIAL/PLATELET
Basophils Absolute: 0 10*3/uL (ref 0.0–0.1)
Basophils Relative: 0.6 % (ref 0.0–3.0)
Eosinophils Absolute: 0.1 10*3/uL (ref 0.0–0.7)
Eosinophils Relative: 1.8 % (ref 0.0–5.0)
HCT: 39.5 % (ref 36.0–46.0)
Hemoglobin: 13.2 g/dL (ref 12.0–15.0)
Lymphocytes Relative: 20.7 % (ref 12.0–46.0)
Lymphs Abs: 1.1 10*3/uL (ref 0.7–4.0)
MCHC: 33.5 g/dL (ref 30.0–36.0)
MCV: 94.2 fl (ref 78.0–100.0)
Monocytes Absolute: 0.4 10*3/uL (ref 0.1–1.0)
Monocytes Relative: 7.8 % (ref 3.0–12.0)
Neutro Abs: 3.7 10*3/uL (ref 1.4–7.7)
Neutrophils Relative %: 69.1 % (ref 43.0–77.0)
Platelets: 252 10*3/uL (ref 150.0–400.0)
RBC: 4.19 Mil/uL (ref 3.87–5.11)
RDW: 13.4 % (ref 11.5–15.5)
WBC: 5.3 10*3/uL (ref 4.0–10.5)

## 2020-12-30 LAB — T3, FREE: T3, Free: 4.2 pg/mL (ref 2.3–4.2)

## 2020-12-30 LAB — VITAMIN D 25 HYDROXY (VIT D DEFICIENCY, FRACTURES): VITD: 70.4 ng/mL (ref 30.00–100.00)

## 2020-12-30 LAB — TSH: TSH: 1.31 u[IU]/mL (ref 0.35–4.50)

## 2020-12-30 LAB — VITAMIN B12: Vitamin B-12: 315 pg/mL (ref 211–911)

## 2020-12-30 LAB — T4, FREE: Free T4: 1.5 ng/dL (ref 0.60–1.60)

## 2020-12-30 LAB — PROTIME-INR
INR: 1 ratio (ref 0.8–1.0)
Prothrombin Time: 11.1 s (ref 9.6–13.1)

## 2020-12-30 NOTE — Patient Instructions (Addendum)
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

## 2020-12-30 NOTE — Assessment & Plan Note (Signed)
Fairly mild slt tender Nl gait and nl rom  Disc gradual return to activity  inst to call if worse and f/u

## 2020-12-30 NOTE — Progress Notes (Signed)
Subjective:    Patient ID: Angela Bell, female    DOB: 03/01/1946, 75 y.o.   MRN: [id number]  This visit occurred during the SARS-CoV-2 public health emergency.  Safety protocols were in place, including screening questions prior to the visit, additional usage of staff PPE, and extensive cleaning of exam room while observing appropriate contact time as indicated for disinfecting solutions.    HPI Pt presents for injuries after a fall   Head and R knee  Wt Readings from Last 3 Encounters:  12/30/20 140 lb 6 oz (63.7 kg)  07/18/20 137 lb 12.8 oz (62.5 kg)  01/01/20 142 lb 7 oz (64.6 kg)   23.36 kg/m  Fell yesterday with 2 small children to the laundry mat  She tripped on their feet and fell on the cement   Head hit the floor  Fell onto R knee   No LOC or confusion  No skin injury  Staff helped her up (she was alert)  Daughter got her and drove her home  Used ice  Had a headache and took 2 advil  Went to work today  No dizziness  Mild headache today- not as bad (not medicines) No blurred vision   Knee is wore - a little red/bruised Hurts-stairs  Can walk   Patient Active Problem List   Diagnosis Date Noted  . Fall on concrete 12/30/2020  . Head contusion 12/30/2020  . Contusion of knee, right 12/30/2020  . Hyperlipidemia 01/01/2020  . History of cold sores 01/01/2020  . Loose stools 04/14/2019  . External hemorrhoids 04/14/2019  . Elevated random blood glucose level 04/14/2019  . Tachycardia 04/14/2019  . Screening mammogram, encounter for 12/26/2018  . Fatty liver 12/26/2018  . Colon cancer screening 12/03/2017  . Encounter for hepatitis C screening test for low risk patient 12/01/2016  . Estrogen deficiency 12/01/2016  . B12 deficiency 12/01/2016  . Routine general medical examination at a health care facility 11/20/2015  . Encounter for Medicare annual wellness exam 08/21/2013  . Dyspepsia 04/25/2012  . Encounter for screening mammogram for breast  cancer 12/22/2011  . ALLERGIC RHINITIS 12/14/2008  . Vitamin D deficiency 08/23/2008  . Hypothyroid 07/26/2008  . Osteopenia 07/23/2008   Past Medical History:  Diagnosis Date  . Allergy    allergic rhinitis  . B12 deficiency   . Back pain    disc disease lumbar  . Colon polyp   . Dyspepsia   . Fatty liver   . GERD (gastroesophageal reflux disease)   . Graves' disease   . Hyperlipidemia   . Hyperthyroidism    Grave's disease (tx with radioactive I)  . Hypothyroidism   . Osteopenia   . Vitamin D deficiency   . Yeast vaginitis    recurrent- keeps terazol on hand / otc does not work   Past Surgical History:  Procedure Laterality Date  . BREAST SURGERY     breast reduction  . CARPAL TUNNEL RELEASE    . CATARACT EXTRACTION, BILATERAL    . CESAREAN SECTION     x 2  . chin implant    . COLONOSCOPY    . COLONOSCOPY WITH PROPOFOL N/A 04/25/2018   Procedure: COLONOSCOPY WITH PROPOFOL;  Surgeon: Scot Jun, MD;  Location[facility] ENDOSCOPY;  Service: Endoscopy;  Laterality: N/A;  . ESOPHAGOGASTRODUODENOSCOPY (EGD) WITH PROPOFOL N/A 04/25/2018   Procedure: ESOPHAGOGASTRODUODENOSCOPY (EGD) WITH PROPOFOL;  Surgeon: Scot Jun, MD;  Location: [facility] ENDOSCOPY;  Service: Endoscopy;  Laterality: N/A;  . EYE  SURGERY     x3 for strabismus  . REDUCTION MAMMAPLASTY Bilateral 1983   Social History   Tobacco Use  . Smoking status: Former Smoker    Quit date: 11/23/1981    Years since quitting: 39.1  . Smokeless tobacco: Never Used  Vaping Use  . Vaping Use: Never used  Substance Use Topics  . Alcohol use: Yes    Alcohol/week: 0.0 standard drinks    Comment: rare  . Drug use: No   Family History  Problem Relation Age of Onset  . Osteoporosis Mother   . Heart disease Mother        mild heart problem  . Breast cancer Cousin   . Arthritis Father        RA   Allergies  Allergen Reactions  . Boniva [Ibandronic Acid]     Leg cramping   . Crestor [Rosuvastatin] Other (See  Comments)    Joint/ Muscle Aches    Current Outpatient Medications on File Prior to Visit  Medication Sig Dispense Refill  . ascorbic acid (VITAMIN C) 100 MG tablet Take by mouth.    . Calcium Carbonate-Vit D-Min 600-400 MG-UNIT TABS Take 2 tablets by mouth daily.    . Cholecalciferol (VITAMIN D3) 5000 UNITS CAPS Take 1 capsule by mouth daily.    . cimetidine (TAGAMET) 400 MG tablet cimetidine 400 mg tablet    . famotidine (PEPCID) 20 MG tablet Take 20 mg by mouth as needed for heartburn or indigestion.    . fexofenadine (ALLEGRA) 180 MG tablet Take 180 mg by mouth daily.    . fexofenadine-pseudoephedrine (ALLEGRA-D 12 HOUR) 60-120 MG 12 hr tablet Take 1 tablet by mouth 2 (two) times daily as needed. 60 tablet 11  . Fluticasone Propionate (FLONASE NA) Place into the nose as needed.    . hydrocortisone 1 % ointment APPLY EXTERNALLY TO HEMORRHOIDS ONCE DAILY AS NEEDED 28 g 1  . levothyroxine (SYNTHROID) 88 MCG tablet Take 88 mcg by mouth daily before breakfast.    . Liver Extract (LIVER PO) Take 1 tablet by mouth every other day.     Marland Kitchen MISC NATURAL PRODUCTS IJ Inject as directed. Allergy Injection    . Multiple Vitamin (MULTIVITAMIN) tablet Take 2 times a week    . Omega-3 Fatty Acids (FISH OIL PO) Take 1,500 mg by mouth daily.    . valACYclovir (VALTREX) 1000 MG tablet Take 2 pills by mouth twice daily for one day for a cold sore 12 tablet 3   No current facility-administered medications on file prior to visit.     Review of Systems  Constitutional: Positive for fatigue. Negative for activity change, appetite change, fever and unexpected weight change.  HENT: Negative for congestion, ear pain, rhinorrhea, sinus pressure and sore throat.   Eyes: Negative for pain, redness and visual disturbance.  Respiratory: Negative for cough, shortness of breath and wheezing.   Cardiovascular: Negative for chest pain and palpitations.  Gastrointestinal: Negative for abdominal pain, blood in stool,  constipation, diarrhea and nausea.  Endocrine: Negative for polydipsia and polyuria.  Genitourinary: Negative for dysuria, frequency and urgency.  Musculoskeletal: Negative for arthralgias, back pain and myalgias.  Skin: Negative for pallor and rash.  Allergic/Immunologic: Negative for environmental allergies.  Neurological: Positive for headaches. Negative for dizziness, tremors, seizures, syncope, facial asymmetry, speech difficulty, weakness, light-headedness and numbness.  Hematological: Negative for adenopathy. Does not bruise/bleed easily.  Psychiatric/Behavioral: Negative for confusion, decreased concentration and dysphoric mood. The patient is not nervous/anxious.  Objective:   Physical Exam Constitutional:      General: She is not in acute distress.    Appearance: Normal appearance. She is well-developed, normal weight and well-nourished. She is not ill-appearing or diaphoretic.  HENT:     Head: Normocephalic and atraumatic.     Comments: No swelling or ecchymosis of face or head  No wound    Right Ear: External ear normal.     Left Ear: External ear normal.     Nose: Nose normal. No congestion.     Mouth/Throat:     Mouth: Oropharynx is clear and moist.     Pharynx: No oropharyngeal exudate.      Comments: No sinus tenderness No temporal tenderness  No TMJ tendernessEyes:     General: No scleral icterus.       Right eye: No discharge.        Left eye: No discharge.     Extraocular Movements: EOM normal.     Conjunctiva/sclera: Conjunctivae normal.     Pupils: Pupils are equal, round, and reactive to light.     Comments: No nystagmus  Neck:     Thyroid: No thyromegaly.     Vascular: No carotid bruit or JVD.     Trachea: No tracheal deviation.  Cardiovascular:     Rate and Rhythm: Normal rate and regular rhythm.     Heart sounds: Normal heart sounds. No murmur heard.   Pulmonary:     Effort: Pulmonary effort is normal. No respiratory distress.     Breath  sounds: Normal breath sounds. No wheezing or rales.  Abdominal:     General: Bowel sounds are normal. There is no distension.     Palpations: Abdomen is soft. There is no mass.     Tenderness: There is no abdominal tenderness.  Musculoskeletal:        General: No tenderness or edema.     Cervical back: Full passive range of motion without pain, normal range of motion and neck supple.     Right lower leg: No edema.     Left lower leg: No edema.     Comments: Knee right  No swelling or effusion  Ecchymosis and scant abrasion on patella No warmth to the touch  No crepitus  ROM: nl Flex nl Ext  nl mcmurray Bounce test   Stability: Anterior drawer-no pain  Lachman exam -no pain   Tenderness -very mild on patella  Nl joint lines  Gait -normal     Lymphadenopathy:     Cervical: No cervical adenopathy.  Skin:    General: Skin is warm and dry.     Coloration: Skin is not pale.     Findings: Bruising present. No rash.  Neurological:     Mental Status: She is alert and oriented to person, place, and time.     Cranial Nerves: No cranial nerve deficit.     Sensory: No sensory deficit.     Motor: No tremor, atrophy or abnormal muscle tone.     Coordination: She displays a negative Romberg sign. Coordination normal.     Gait: Gait normal.     Deep Tendon Reflexes: Strength normal and reflexes are normal and symmetric.     Comments: No focal cerebellar signs   Psychiatric:        Mood and Affect: Mood and affect and mood normal.        Behavior: Behavior normal.        Thought Content: Thought content  normal.           Assessment & Plan:   Problem List Items Addressed This Visit      Other   Fall on concrete - Primary    At laundry mat -tripped over grandkids who were underfoot Mild head and knee inj (see A/P) Disc strategy to prevent further falls      Head contusion    R anterior Mild headache/improving  Discussed mild concussion (no LOS) Concussion  protocol/brain rest advised  Will wtay out of work today and tom  Ice prn, rest from screens/reading  Disc late s/s to watch for like worse HA, dizziness or nausea  Disc fall prev in the future      Contusion of knee, right    Fairly mild slt tender Nl gait and nl rom  Disc gradual return to activity  inst to call if worse and f/u

## 2020-12-30 NOTE — Telephone Encounter (Signed)
Per lab tab pt has already had labs drawn today.

## 2020-12-30 NOTE — Assessment & Plan Note (Signed)
At laundry mat -tripped over grandkids who were underfoot Mild head and knee inj (see A/P) Disc strategy to prevent further falls

## 2020-12-30 NOTE — Assessment & Plan Note (Signed)
R anterior Mild headache/improving  Discussed mild concussion (no LOS) Concussion protocol/brain rest advised  Will wtay out of work today and tom  Ice prn, rest from screens/reading  Disc late s/s to watch for like worse HA, dizziness or nausea  Disc fall prev in the future

## 2020-12-30 NOTE — Telephone Encounter (Signed)
[place] Night - Client Nonclinical Telephone Record AccessNurse Client [facility] Night - Client Client Site [place] Physician Loura Pardon - MD Contact Type Call Who Is Calling Patient / Member / Family / Caregiver Caller Name [place] Phone Number [phone] Patient Name Angela Bell Patient DOB 21-Jul-1946 Call Type Message Only Information Provided Reason for Call Request for General Office Information Initial Comment Caller states that she has an 8am appointment and she wants to make sure the office hours aren't delayed due to weather. Declined triage. Disp. Time Disposition Final User 12/30/2020 7:24:06 AM General Information Provided Yes Windy Canny Call Closed By: Windy Canny Transaction Date/Time: 12/30/2020 7:21:06 AM (ET)

## 2021-01-01 DIAGNOSIS — J301 Allergic rhinitis due to pollen: Secondary | ICD-10-CM | POA: Diagnosis not present

## 2021-01-02 ENCOUNTER — Other Ambulatory Visit: Payer: Self-pay | Admitting: Gastroenterology

## 2021-01-02 DIAGNOSIS — K760 Fatty (change of) liver, not elsewhere classified: Secondary | ICD-10-CM

## 2021-01-02 DIAGNOSIS — K649 Unspecified hemorrhoids: Secondary | ICD-10-CM | POA: Diagnosis not present

## 2021-01-02 DIAGNOSIS — K219 Gastro-esophageal reflux disease without esophagitis: Secondary | ICD-10-CM | POA: Diagnosis not present

## 2021-01-06 ENCOUNTER — Encounter: Payer: PPO | Admitting: Family Medicine

## 2021-01-08 DIAGNOSIS — J301 Allergic rhinitis due to pollen: Secondary | ICD-10-CM | POA: Diagnosis not present

## 2021-01-09 ENCOUNTER — Encounter: Payer: Self-pay | Admitting: Family Medicine

## 2021-01-09 ENCOUNTER — Ambulatory Visit (INDEPENDENT_AMBULATORY_CARE_PROVIDER_SITE_OTHER): Payer: PPO | Admitting: Family Medicine

## 2021-01-09 ENCOUNTER — Other Ambulatory Visit: Payer: Self-pay

## 2021-01-09 VITALS — BP 112/64 | HR 56 | Temp 97.0°F | Ht 64.0 in | Wt 140.3 lb

## 2021-01-09 DIAGNOSIS — E559 Vitamin D deficiency, unspecified: Secondary | ICD-10-CM | POA: Diagnosis not present

## 2021-01-09 DIAGNOSIS — K760 Fatty (change of) liver, not elsewhere classified: Secondary | ICD-10-CM | POA: Diagnosis not present

## 2021-01-09 DIAGNOSIS — R739 Hyperglycemia, unspecified: Secondary | ICD-10-CM

## 2021-01-09 DIAGNOSIS — E538 Deficiency of other specified B group vitamins: Secondary | ICD-10-CM

## 2021-01-09 DIAGNOSIS — E890 Postprocedural hypothyroidism: Secondary | ICD-10-CM

## 2021-01-09 DIAGNOSIS — R7309 Other abnormal glucose: Secondary | ICD-10-CM | POA: Diagnosis not present

## 2021-01-09 DIAGNOSIS — Z0000 Encounter for general adult medical examination without abnormal findings: Secondary | ICD-10-CM | POA: Diagnosis not present

## 2021-01-09 DIAGNOSIS — M8589 Other specified disorders of bone density and structure, multiple sites: Secondary | ICD-10-CM

## 2021-01-09 DIAGNOSIS — E7800 Pure hypercholesterolemia, unspecified: Secondary | ICD-10-CM | POA: Diagnosis not present

## 2021-01-09 MED ORDER — CYANOCOBALAMIN 1000 MCG/ML IJ SOLN
1000.0000 ug | Freq: Once | INTRAMUSCULAR | Status: AC
  Administered 2021-01-09: 1000 ug via INTRAMUSCULAR

## 2021-01-09 MED ORDER — LEVOTHYROXINE SODIUM 88 MCG PO TABS
88.0000 ug | ORAL_TABLET | Freq: Every day | ORAL | 3 refills | Status: DC
Start: 2021-01-09 — End: 2022-01-09

## 2021-01-09 NOTE — Progress Notes (Signed)
Subjective:    Patient ID: Angela Bell, female    DOB: 26-Mar-1946, 75 y.o.   MRN: [id number] This visit occurred during the SARS-CoV-2 public health emergency.  Safety protocols were in place, including screening questions prior to the visit, additional usage of staff PPE, and extensive cleaning of exam room while observing appropriate contact time as indicated for disinfecting solutions.     HPI Pt presents for amw and health mt exam  I have personally reviewed the Medicare Annual Wellness questionnaire and have noted 1. The patient's medical and social history 2. Their use of alcohol, tobacco or illicit drugs 3. Their current medications and supplements 4. The patient's functional ability including ADL's, fall risks, home safety risks and hearing or visual             impairment. 5. Diet and physical activities 6. Evidence for depression or mood disorders  The patients weight, height, BMI have been recorded in the chart and visual acuity is per eye clinic.  I have made referrals, counseling and provided education to the patient based review of the above and I have provided the pt with a written personalized care plan for preventive services. Reviewed and updated provider list, see scanned forms.  See scanned forms.  Routine anticipatory guidance given to patient.  See health maintenance. Colon cancer screening colonoscopy 6/19 Breast cancer screening  Mammogram 9/20 Self breast exam-no lumps  Flu vaccine 9/21 covid status -immunized with booster  Tetanus vaccine 10/19 Td Pneumovax completed Zoster vaccine  zostavax 2012, she checked and will get the shingrix  Dexa 6/21 osteopenia - on boniva in the past  She wants to try Prolia  Falls- recent with one on concrete  No headache or other symptoms - balance is ok  Fractures-none Supplements- mvi , 5000 iu D3  Vit D level is 70 Exercise - toning , a little cardio and active job Forensic scientist- utd now  Cognitive  function addressed- see scanned forms- and if abnormal then additional documentation follows.  No big changes, no problems at all  occ forgets names -no change  PMH and SH reviewed  Meds, vitals, and allergies reviewed.   ROS: See HPI.  Otherwise negative.    Weight : Wt Readings from Last 3 Encounters:  01/09/21 140 lb 5 oz (63.6 kg)  12/30/20 140 lb 6 oz (63.7 kg)  07/18/20 137 lb 12.8 oz (62.5 kg)   24.08 kg/m Stable   Hearing/vision:  Hearing Screening   125Hz  250Hz  500Hz  1000Hz  2000Hz  3000Hz  4000Hz  6000Hz  8000Hz   Right ear:   40 40 40  40    Left ear:   40 40 40  40    Vision Screening Comments: Eye exam with Dr. Satira Sark in Dec 2021 had cataract surgery  Was being watched for glaucoma  Care team  Avin Upperman-pcp [facility]- gen surg Tami Ribas- ENT  BP Readings from Last 3 Encounters:  01/09/21 112/64  12/30/20 106/62  07/18/20 108/70   Pulse Readings from Last 3 Encounters:  01/09/21 (!) 56  12/30/20 72  07/18/20 81   Hypothyroidism  Pt has no clinical changes No change in energy level/ hair or skin/ edema and no tremor Lab Results  Component Value Date   TSH 1.31 12/30/2020    levothy 88 mcg daily  Nl FT4 at 1.5 Saw Dr Celine Ahr in the past (had radiation in the past for hyperthyroidism) and Dr Altheimer   Was taking biotin - she holds it before her blood test  Wants [place] to take over thyroid medicine   Hyperlipidemia Lab Results  Component Value Date   CHOL 203 (H) 12/30/2020   CHOL 177 02/12/2020   CHOL 221 (H) 12/25/2019   Lab Results  Component Value Date   HDL 57.20 12/30/2020   HDL 56.50 02/12/2020   HDL 69.50 12/25/2019   Lab Results  Component Value Date   LDLCALC 125 (H) 12/30/2020   LDLCALC 104 (H) 02/12/2020   LDLCALC 141 (H) 12/25/2019   Lab Results  Component Value Date   TRIG 105.0 12/30/2020   TRIG 80.0 02/12/2020   TRIG 53.0 12/25/2019   Lab Results  Component Value Date   CHOLHDL 4 12/30/2020   CHOLHDL 3 02/12/2020   CHOLHDL  3 12/25/2019   Lab Results  Component Value Date   LDLDIRECT 146.3 08/22/2013   LDLDIRECT 128.6 04/19/2012   crestor gave her muscle aches (every intemittent)  Diet is good overall  Avoiding fried foods and red meat    Glucose Lab Results  Component Value Date   HGBA1C 5.7 12/30/2020   B12 def Lab Results  Component Value Date   VITAMINB12 315 12/30/2020   Wants a B12 shot today   Lab Results  Component Value Date   WBC 5.3 12/30/2020   HGB 13.2 12/30/2020   HCT 39.5 12/30/2020   MCV 94.2 12/30/2020   PLT 252.0 12/30/2020   Lab Results  Component Value Date   ALT 12 12/30/2020   AST 15 12/30/2020   ALKPHOS 118 (H) 12/30/2020   BILITOT 0.9 12/30/2020  sees GI for liver - fatty liver   Lab Results  Component Value Date   CREATININE 0.74 12/30/2020   BUN 12 12/30/2020   NA 139 12/30/2020   K 4.2 12/30/2020   CL 105 12/30/2020   CO2 27 12/30/2020   Patient Active Problem List   Diagnosis Date Noted  . Fall on concrete 12/30/2020  . Head contusion 12/30/2020  . Contusion of knee, right 12/30/2020  . Hyperlipidemia 01/01/2020  . History of cold sores 01/01/2020  . Loose stools 04/14/2019  . External hemorrhoids 04/14/2019  . Elevated random blood glucose level 04/14/2019  . Tachycardia 04/14/2019  . Screening mammogram, encounter for 12/26/2018  . Fatty liver 12/26/2018  . Colon cancer screening 12/03/2017  . Encounter for hepatitis C screening test for low risk patient 12/01/2016  . Estrogen deficiency 12/01/2016  . B12 deficiency 12/01/2016  . Routine general medical examination at a health care facility 11/20/2015  . Encounter for Medicare annual wellness exam 08/21/2013  . Dyspepsia 04/25/2012  . Encounter for screening mammogram for breast cancer 12/22/2011  . ALLERGIC RHINITIS 12/14/2008  . Vitamin D deficiency 08/23/2008  . Hypothyroid 07/26/2008  . Osteopenia 07/23/2008   Past Medical History:  Diagnosis Date  . Allergy    allergic  rhinitis  . B12 deficiency   . Back pain    disc disease lumbar  . Colon polyp   . Dyspepsia   . Fatty liver   . GERD (gastroesophageal reflux disease)   . Graves' disease   . Hyperlipidemia   . Hyperthyroidism    Grave's disease (tx with radioactive I)  . Hypothyroidism   . Osteopenia   . Vitamin D deficiency   . Yeast vaginitis    recurrent- keeps terazol on hand / otc does not work   Past Surgical History:  Procedure Laterality Date  . BREAST SURGERY     breast reduction  . CARPAL TUNNEL RELEASE    .  CATARACT EXTRACTION, BILATERAL    . CESAREAN SECTION     x 2  . chin implant    . COLONOSCOPY    . COLONOSCOPY WITH PROPOFOL N/A 04/25/2018   Procedure: COLONOSCOPY WITH PROPOFOL;  Surgeon: Manya Silvas, MD;  Location[facility] ENDOSCOPY;  Service: Endoscopy;  Laterality: N/A;  . ESOPHAGOGASTRODUODENOSCOPY (EGD) WITH PROPOFOL N/A 04/25/2018   Procedure: ESOPHAGOGASTRODUODENOSCOPY (EGD) WITH PROPOFOL;  Surgeon: Manya Silvas, MD;  Location: [facility] ENDOSCOPY;  Service: Endoscopy;  Laterality: N/A;  . EYE SURGERY     x3 for strabismus  . REDUCTION MAMMAPLASTY Bilateral 1983   Social History   Tobacco Use  . Smoking status: Former Smoker    Quit date: 11/23/1981    Years since quitting: 39.1  . Smokeless tobacco: Never Used  Vaping Use  . Vaping Use: Never used  Substance Use Topics  . Alcohol use: Yes    Alcohol/week: 0.0 standard drinks    Comment: rare  . Drug use: No   Family History  Problem Relation Age of Onset  . Osteoporosis Mother   . Heart disease Mother        mild heart problem  . Breast cancer Cousin   . Arthritis Father        RA   Allergies  Allergen Reactions  . Boniva [Ibandronic Acid]     Leg cramping   . Crestor [Rosuvastatin] Other (See Comments)    Joint/ Muscle Aches    Current Outpatient Medications on File Prior to Visit  Medication Sig Dispense Refill  . ascorbic acid (VITAMIN C) 100 MG tablet Take by mouth.    . Calcium  Carbonate-Vit D-Min 600-400 MG-UNIT TABS Take 2 tablets by mouth daily.    . Cholecalciferol (VITAMIN D3) 5000 UNITS CAPS Take 1 capsule by mouth daily.    . cimetidine (TAGAMET) 400 MG tablet cimetidine 400 mg tablet    . famotidine (PEPCID) 20 MG tablet Take 20 mg by mouth as needed for heartburn or indigestion.    . fexofenadine (ALLEGRA) 180 MG tablet Take 180 mg by mouth daily.    . fexofenadine-pseudoephedrine (ALLEGRA-D 12 HOUR) 60-120 MG 12 hr tablet Take 1 tablet by mouth 2 (two) times daily as needed. 60 tablet 11  . Fluticasone Propionate (FLONASE NA) Place into the nose as needed.    . hydrocortisone 1 % ointment APPLY EXTERNALLY TO HEMORRHOIDS ONCE DAILY AS NEEDED 28 g 1  . Liver Extract (LIVER PO) Take 1 tablet by mouth every other day.     Marland Kitchen MISC NATURAL PRODUCTS IJ Inject as directed. Allergy Injection    . Multiple Vitamin (MULTIVITAMIN) tablet Take 2 times a week    . Omega-3 Fatty Acids (FISH OIL PO) Take 1,500 mg by mouth daily.    . valACYclovir (VALTREX) 1000 MG tablet Take 2 pills by mouth twice daily for one day for a cold sore 12 tablet 3   No current facility-administered medications on file prior to visit.    Review of Systems  Constitutional: Positive for fatigue. Negative for activity change, appetite change, fever and unexpected weight change.  HENT: Negative for congestion, ear pain, rhinorrhea, sinus pressure and sore throat.   Eyes: Negative for pain, redness and visual disturbance.  Respiratory: Negative for cough, shortness of breath and wheezing.   Cardiovascular: Negative for chest pain and palpitations.  Gastrointestinal: Negative for abdominal pain, blood in stool, constipation and diarrhea.  Endocrine: Negative for polydipsia and polyuria.  Genitourinary: Negative for dysuria, frequency  and urgency.  Musculoskeletal: Negative for arthralgias, back pain and myalgias.  Skin: Negative for pallor and rash.  Allergic/Immunologic: Negative for  environmental allergies.  Neurological: Negative for dizziness, syncope and headaches.  Hematological: Negative for adenopathy. Does not bruise/bleed easily.  Psychiatric/Behavioral: Negative for decreased concentration and dysphoric mood. The patient is not nervous/anxious.        Objective:   Physical Exam Constitutional:      General: She is not in acute distress.    Appearance: Normal appearance. She is well-developed and normal weight. She is not ill-appearing or diaphoretic.  HENT:     Head: Normocephalic and atraumatic.     Right Ear: Tympanic membrane, ear canal and external ear normal.     Left Ear: Tympanic membrane, ear canal and external ear normal.     Nose: Nose normal. No congestion.     Mouth/Throat:     Mouth: Mucous membranes are moist.     Pharynx: Oropharynx is clear. No posterior oropharyngeal erythema.  Eyes:     General: No scleral icterus.    Extraocular Movements: Extraocular movements intact.     Conjunctiva/sclera: Conjunctivae normal.     Pupils: Pupils are equal, round, and reactive to light.  Neck:     Thyroid: No thyromegaly.     Vascular: No carotid bruit or JVD.  Cardiovascular:     Rate and Rhythm: Normal rate and regular rhythm.     Pulses: Normal pulses.     Heart sounds: Normal heart sounds. No gallop.   Pulmonary:     Effort: Pulmonary effort is normal. No respiratory distress.     Breath sounds: Normal breath sounds. No wheezing.     Comments: Good air exch Chest:     Chest wall: No tenderness.  Abdominal:     General: Bowel sounds are normal. There is no distension or abdominal bruit.     Palpations: Abdomen is soft. There is no mass.     Tenderness: There is no abdominal tenderness.     Hernia: No hernia is present.  Genitourinary:    Comments: Breast exam: No mass, nodules, thickening, tenderness, bulging, retraction, inflamation, nipple discharge or skin changes noted.  No axillary or clavicular LA.     Musculoskeletal:         General: No tenderness. Normal range of motion.     Cervical back: Normal range of motion and neck supple. No rigidity. No muscular tenderness.     Right lower leg: No edema.     Left lower leg: No edema.     Comments: No kyphosis   Lymphadenopathy:     Cervical: No cervical adenopathy.  Skin:    General: Skin is warm and dry.     Coloration: Skin is not pale.     Findings: No erythema or rash.     Comments: Solar lentigines diffusely   Neurological:     Mental Status: She is alert. Mental status is at baseline.     Cranial Nerves: No cranial nerve deficit.     Motor: No abnormal muscle tone.     Coordination: Coordination normal.     Gait: Gait normal.     Deep Tendon Reflexes: Reflexes are normal and symmetric. Reflexes normal.  Psychiatric:        Mood and Affect: Mood normal.        Cognition and Memory: Cognition and memory normal.     Comments: Pleasant  Assessment & Plan:   Problem List Items Addressed This Visit      Digestive   Fatty liver    Nl LFT Alk phos is 118  Plans to continue f/u with GI        Endocrine   Hypothyroid    Hypothyroidism  Pt has no clinical changes No change in energy level/ hair or skin/ edema and no tremor Lab Results  Component Value Date   TSH 1.31 12/30/2020    FT4 1.5  Plan to continue levothyroxine to 88 mcg daily  Plans to change her thyroid care to [place]  Will continue to hold biotin around time of blood draws       Relevant Medications   levothyroxine (SYNTHROID) 88 MCG tablet     Musculoskeletal and Integument   Osteopenia    dexa 6/21  One recent accidental fall  Taking vit D and mvi Vit D level is 6 Interested in prolia-plans to check with insurance and let us know so we can start prior auth process         Other   Vitamin D deficiency    Level of 70 Plans to continue 5000 iu daily  Disc imp to bone and overall health      Encounter for Medicare annual wellness exam - Primary     Reviewed health habits including diet and exercise and skin cancer prevention Reviewed appropriate screening tests for age  Also reviewed health mt list, fam hx and immunization status , as well as social and family history   See HPI Labs reviewed  covid immunized Plans to check on coverage of shingrix  dexa utd- considering prolia if covered Disc fall prevention  No cognitive concerns Normal hearing screen utd vision/eye care        Routine general medical examination at a health care facility    Reviewed health habits including diet and exercise and skin cancer prevention Reviewed appropriate screening tests for age  Also reviewed health mt list, fam hx and immunization status , as well as social and family history   See HPI Labs reviewed  covid immunized Plans to check on coverage of shingrix  dexa utd- considering prolia if covered Disc fall prevention  No cognitive concerns Normal hearing screen utd vision/eye care         B12 deficiency    Lab Results  Component Value Date   VITAMINB12 315 12/30/2020   Shot given today to boost level  Will continue 500 mcg oral B12 daily otc      Elevated random blood glucose level    Lab Results  Component Value Date   HGBA1C 5.7 12/30/2020   disc imp of low glycemic diet and wt loss to prevent DM2       Hyperlipidemia    Tried crestor but this gave her muscle pain even at low intermittent dosing  LDLof 125  Disc goals for lipids and reasons to control them Rev last labs with pt Rev low sat fat diet in detail

## 2021-01-09 NOTE — Assessment & Plan Note (Signed)
Reviewed health habits including diet and exercise and skin cancer prevention Reviewed appropriate screening tests for age  Also reviewed health mt list, fam hx and immunization status , as well as social and family history   See HPI Labs reviewed  covid immunized Plans to check on coverage of shingrix  dexa utd- considering prolia if covered Disc fall prevention  No cognitive concerns Normal hearing screen utd vision/eye care

## 2021-01-09 NOTE — Assessment & Plan Note (Signed)
Tried crestor but this gave her muscle pain even at low intermittent dosing  LDLof 125  Disc goals for lipids and reasons to control them Rev last labs with pt Rev low sat fat diet in detail

## 2021-01-09 NOTE — Assessment & Plan Note (Signed)
Lab Results  Component Value Date   HGBA1C 5.7 12/30/2020   disc imp of low glycemic diet and wt loss to prevent DM2

## 2021-01-09 NOTE — Assessment & Plan Note (Signed)
Nl LFT Alk phos is 118  Plans to continue f/u with GI

## 2021-01-09 NOTE — Assessment & Plan Note (Signed)
Level of 70 Plans to continue 5000 iu daily  Disc imp to bone and overall health

## 2021-01-09 NOTE — Assessment & Plan Note (Signed)
Lab Results  Component Value Date   VITAMINB12 315 12/30/2020   Shot given today to boost level  Will continue 500 mcg oral B12 daily otc

## 2021-01-09 NOTE — Patient Instructions (Addendum)
Please schedule your mammogram   If you are interested in the new shingles vaccine (Shingrix) - call your local pharmacy to check on coverage and availability  If affordable, get on a wait list at your pharmacy to get the vaccine.  Check with your insurance to see if Prolia is covered for bone density   Watch diet for cholesterol  Avoid red meat/ fried foods/ egg yolks/ fatty breakfast meats/ butter, cheese and high fat dairy/ and shellfish    Get vitamin B12 over the counter and take 500 mcg daily  B12 shot today   Schedule labs in 6 months for thyroid

## 2021-01-09 NOTE — Assessment & Plan Note (Addendum)
Reviewed health habits including diet and exercise and skin cancer prevention Reviewed appropriate screening tests for age  Also reviewed health mt list, fam hx and immunization status , as well as social and family history   See HPI Labs reviewed  covid immunized Plans to check on coverage of shingrix  dexa utd- considering prolia if covered Disc fall prevention  No cognitive concerns Normal hearing screen utd vision/eye care

## 2021-01-09 NOTE — Assessment & Plan Note (Signed)
Hypothyroidism  Pt has no clinical changes No change in energy level/ hair or skin/ edema and no tremor Lab Results  Component Value Date   TSH 1.31 12/30/2020    FT4 1.5  Plan to continue levothyroxine to 88 mcg daily  Plans to change her thyroid care to [place]  Will continue to hold biotin around time of blood draws

## 2021-01-09 NOTE — Assessment & Plan Note (Signed)
dexa 6/21  One recent accidental fall  Taking vit D and mvi Vit D level is 70 Interested in prolia-plans to check with insurance and let us know so we can start prior auth process

## 2021-01-10 ENCOUNTER — Telehealth: Payer: Self-pay

## 2021-01-10 NOTE — Telephone Encounter (Signed)
Pt said she was seen for medicare wellness on 01/09/21; pt has checked with her ins co; pt request prolia rx sent to pharmacy so pt can pick up; this is most affordable way for pt to get the prolia (cost to pt will be $200.00) then pt wants to know if she can come to [facility] for the injection. Pt request cb after reviewed. Sending note to DR Glori Bickers and Larene Beach RN. [facility].

## 2021-01-10 NOTE — Telephone Encounter (Signed)
Thanks for letting me know  Larene Beach- do you think we would be able to get this covered better for her ?

## 2021-01-13 NOTE — Telephone Encounter (Signed)
Benefit verification in progress

## 2021-01-13 NOTE — Telephone Encounter (Signed)
Medical benefits will be ran to find cost and if a PA is needed.

## 2021-01-14 ENCOUNTER — Encounter: Payer: Self-pay | Admitting: Family Medicine

## 2021-01-14 NOTE — Telephone Encounter (Signed)
Will route to [place] to see if that is an option

## 2021-01-15 DIAGNOSIS — J301 Allergic rhinitis due to pollen: Secondary | ICD-10-CM | POA: Diagnosis not present

## 2021-01-17 ENCOUNTER — Ambulatory Visit
Admission: RE | Admit: 2021-01-17 | Discharge: 2021-01-17 | Disposition: A | Discharge status: Home / Self Care | Payer: PPO | Source: Ambulatory Visit | Attending: Gastroenterology | Admitting: Gastroenterology

## 2021-01-17 ENCOUNTER — Other Ambulatory Visit: Payer: Self-pay

## 2021-01-17 DIAGNOSIS — K760 Fatty (change of) liver, not elsewhere classified: Secondary | ICD-10-CM | POA: Insufficient documentation

## 2021-01-20 NOTE — Telephone Encounter (Signed)
Patient called in checking on status. Stated it can take about a week or so to go through, but call will be given when we hear. Patient expressed understanding. Sending as Angela Bell

## 2021-01-20 NOTE — Telephone Encounter (Signed)
Did we do this?

## 2021-01-22 ENCOUNTER — Other Ambulatory Visit: Payer: Self-pay | Admitting: Family Medicine

## 2021-01-22 DIAGNOSIS — J301 Allergic rhinitis due to pollen: Secondary | ICD-10-CM | POA: Diagnosis not present

## 2021-01-22 DIAGNOSIS — Z1231 Encounter for screening mammogram for malignant neoplasm of breast: Secondary | ICD-10-CM

## 2021-01-24 NOTE — Telephone Encounter (Signed)
I ran her medical benefits and they were submitted on 01/13/21. Have we heard back from [place]? Do we need to run her pharmacy benefits?

## 2021-01-28 ENCOUNTER — Encounter: Payer: Self-pay | Admitting: Family Medicine

## 2021-01-28 ENCOUNTER — Telehealth: Payer: Self-pay

## 2021-01-28 NOTE — Telephone Encounter (Signed)
Called and LVM for patient to call office back regarding her Prolia.

## 2021-01-28 NOTE — Telephone Encounter (Signed)
I reviewed her labs and think that is fine

## 2021-01-28 NOTE — Telephone Encounter (Signed)
Spoke with patient and informed her that it would be $255 OOP for her Prolia injection. Patient verbalized understanding. Labs recently obtained on 2/7, will check with Dr. Glori Bickers to see if this can be accepted. NV scheduled for 3/29. Patient stated that her insurance company told her it would be $200 if it was sent to [facility] [address] [facility] [place]

## 2021-01-28 NOTE — Telephone Encounter (Signed)
Pt is returning call about Prolia; Anisha RN will speak with pt.

## 2021-01-30 NOTE — Telephone Encounter (Signed)
Ca is normal and CrCl is 42mL/min Labs ok for inj.  Contacted [facility] and pharmacist said they do not have prolia there because it is a specialty medication and would have to be sent to [place] if needed. It can be mailed to their store, the pt, or the clinic. Pharmacist could not run script to see if PA was needed. Must send script to [place].   Phone number for [facility] is [phone].   Contacted pt and advised of mail order pharmacy. Pt said she would rather not bother with all of that and just let this office order it. She agreed to the cost of $255. She requested a sooner apt. RS prolia NV for 3/17 @9am . Pt appreciative and verbalized understanding.

## 2021-02-05 DIAGNOSIS — J301 Allergic rhinitis due to pollen: Secondary | ICD-10-CM | POA: Diagnosis not present

## 2021-02-06 ENCOUNTER — Ambulatory Visit (INDEPENDENT_AMBULATORY_CARE_PROVIDER_SITE_OTHER): Payer: PPO

## 2021-02-06 ENCOUNTER — Telehealth: Payer: Self-pay

## 2021-02-06 ENCOUNTER — Other Ambulatory Visit: Payer: Self-pay

## 2021-02-06 DIAGNOSIS — M8589 Other specified disorders of bone density and structure, multiple sites: Secondary | ICD-10-CM | POA: Diagnosis not present

## 2021-02-06 MED ORDER — DENOSUMAB 60 MG/ML ~~LOC~~ SOSY
60.0000 mg | PREFILLED_SYRINGE | Freq: Once | SUBCUTANEOUS | Status: AC
Start: 2021-02-06 — End: 2021-02-06
  Administered 2021-02-06: 60 mg via SUBCUTANEOUS

## 2021-02-06 NOTE — Telephone Encounter (Signed)
Pt was received Prolia inj today.

## 2021-02-06 NOTE — Telephone Encounter (Signed)
Someone will contact pt for her next inj in 6 months.

## 2021-02-06 NOTE — Telephone Encounter (Signed)
Left message to return call to our office.  

## 2021-02-06 NOTE — Progress Notes (Signed)
Per orders of Dr. Einar Pheasant, injection of Prolia given by Brenton Grills. Patient tolerated injection well.

## 2021-02-06 NOTE — Telephone Encounter (Signed)
Patient had her Prolia injection done today 02/06/21 and wanted to follow up to make sure someone would be in touch with her about getting the next one set up. She states she comes extra 6 months    Please call and advise

## 2021-02-07 IMAGING — US US ABDOMEN COMPLETE W/ ELASTOGRAPHY
1 series · 12 of 25 positions shown · non-contrast
Comparison: Abdomen ultrasound only on 09/01/2018

CLINICAL DATA: Nonalcoholic fatty liver disease.

EXAM:
ULTRASOUND ABDOMEN
ULTRASOUND HEPATIC ELASTOGRAPHY
TECHNIQUE: Sonography of the upper abdomen was performed. In addition,
ultrasound elastography evaluation of the liver was performed. A
region of interest was placed within the right lobe of the liver.
Following application of a compressive sonographic pulse, tissue
compressibility was assessed. Multiple assessments were performed at
the selected site. Median tissue compressibility was determined.
Previously, hepatic stiffness was assessed by shear wave velocity.
Based on recently published Society of Radiologists in Ultrasound
consensus article, reporting is now recommended to be performed in
the SI units of pressure (kiloPascals) representing hepatic
stiffness/elasticity. The obtained result is compared to the
published reference standards. (cACLD= compensated Advanced Chronic
Liver Disease)

[Series 1: us abdomen complete w/ elastography · 12 of 136 slices shown]
[im 6/136]
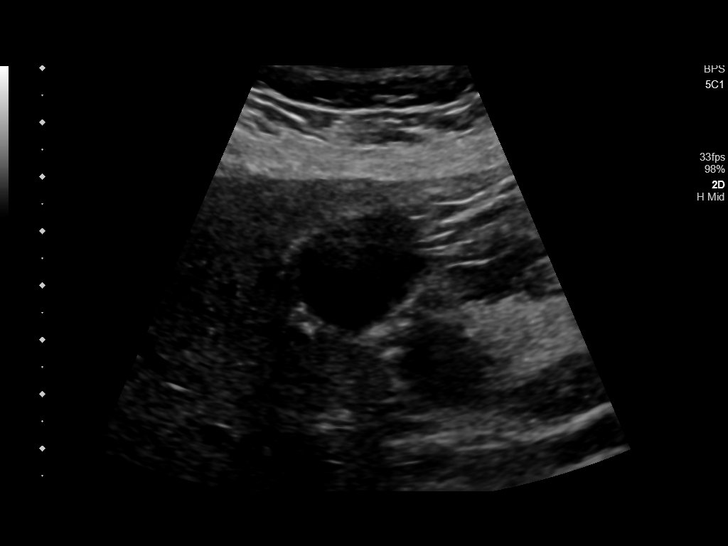
[im 17/136]
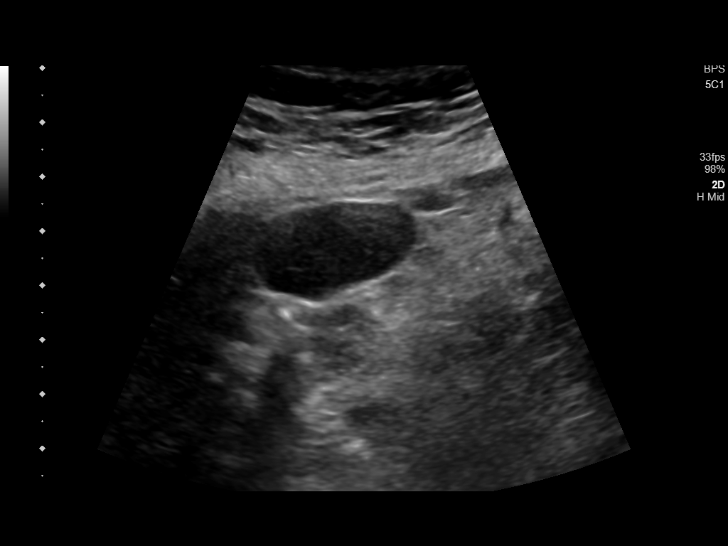
[im 29/136]
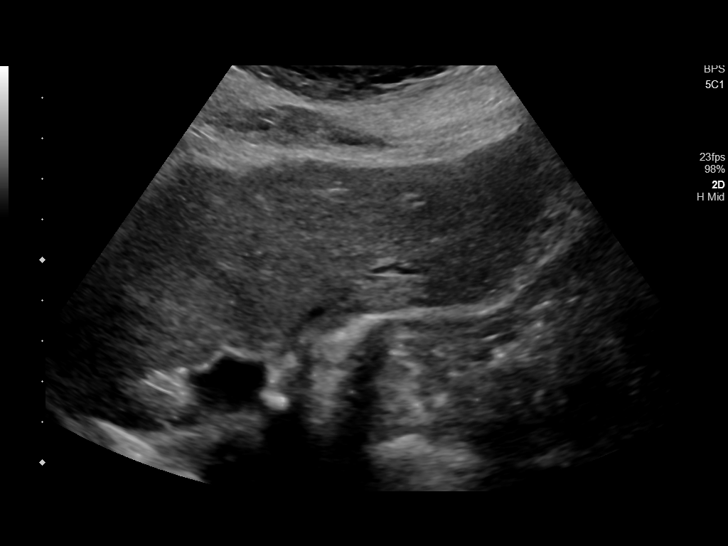
[im 40/136]
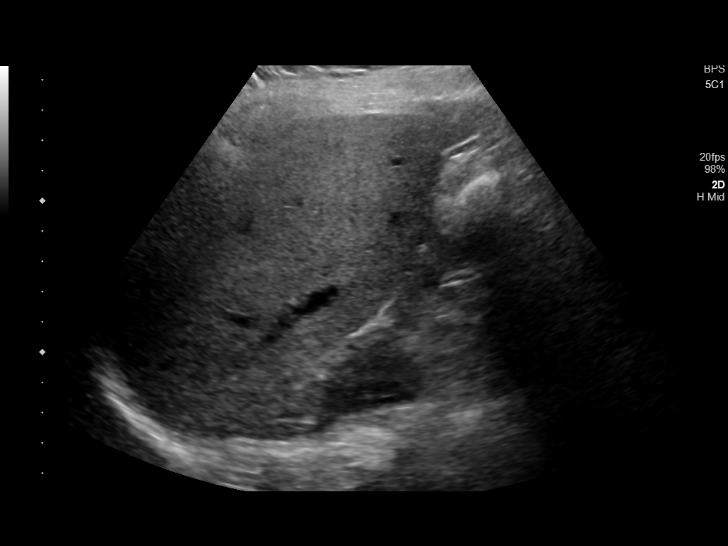
[im 51/136]
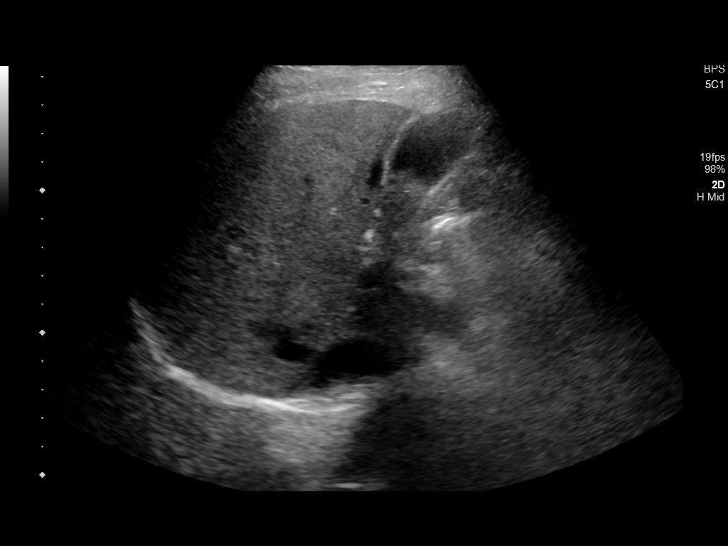
[im 62/136]
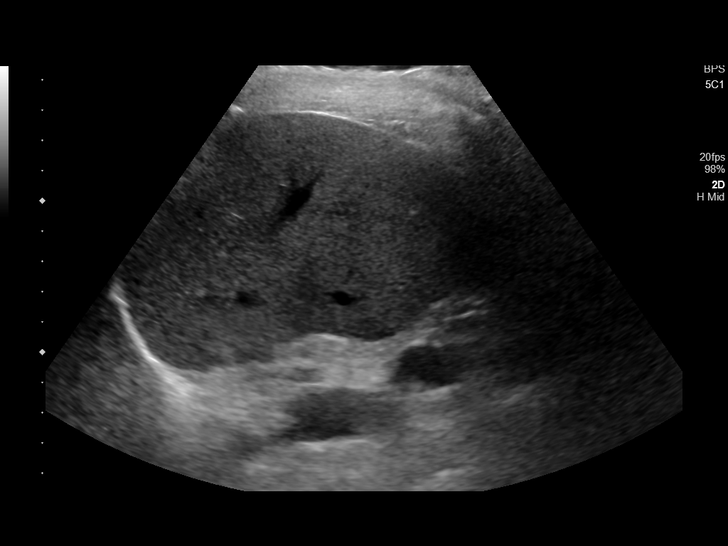
[im 74/136]
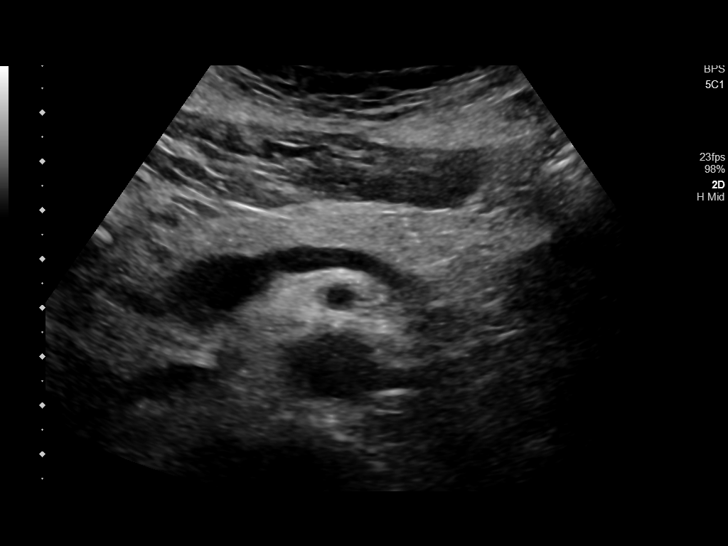
[im 85/136]
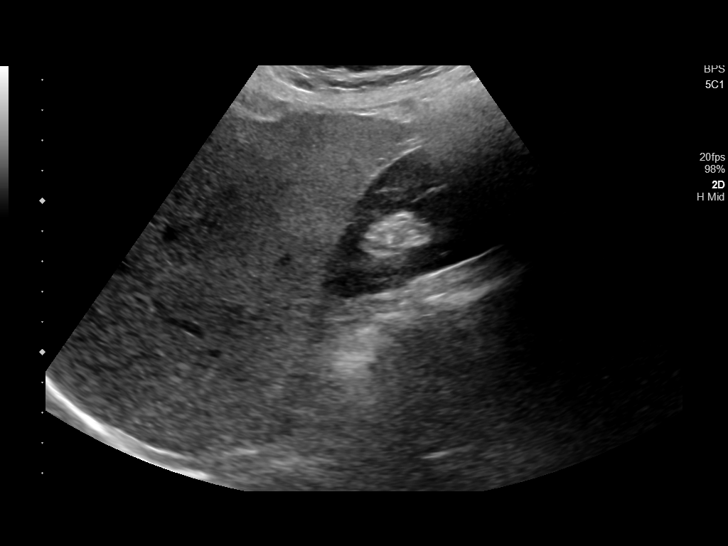
[im 96/136]
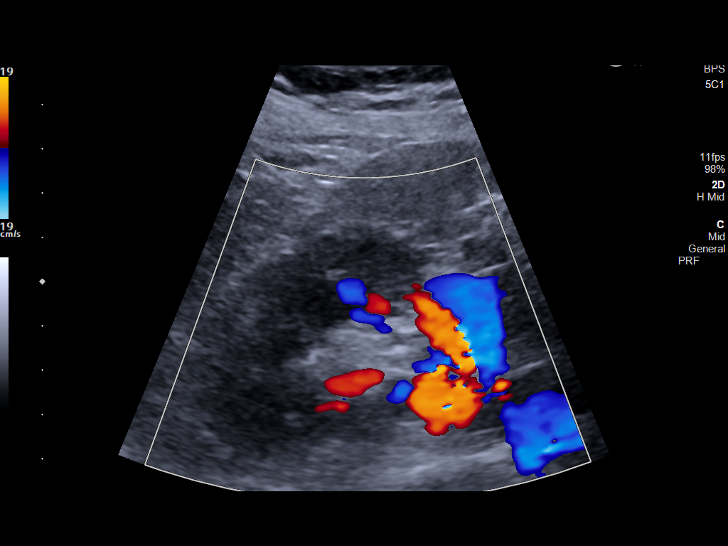
[im 107/136]
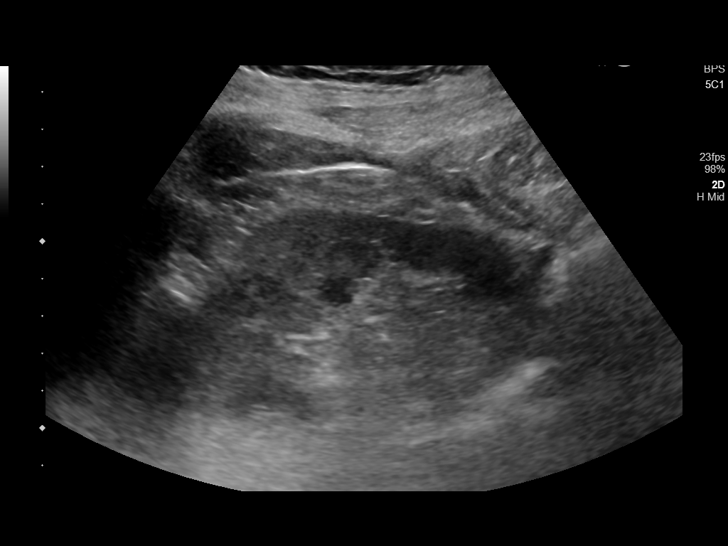
[im 119/136]
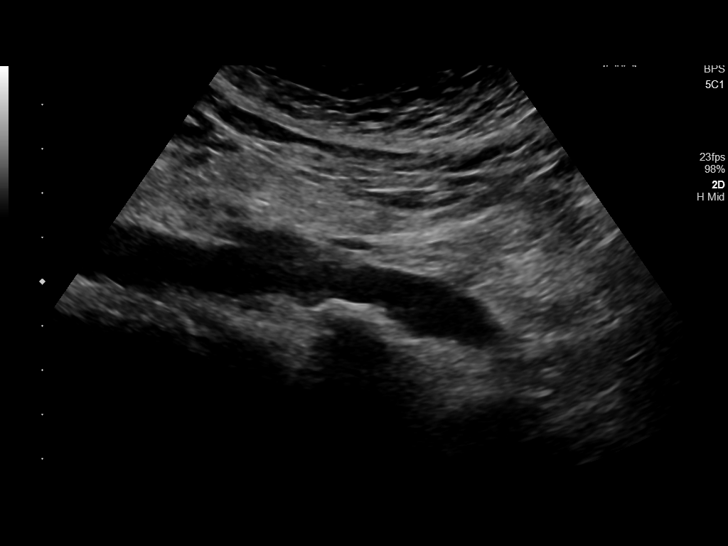
[im 130/136]
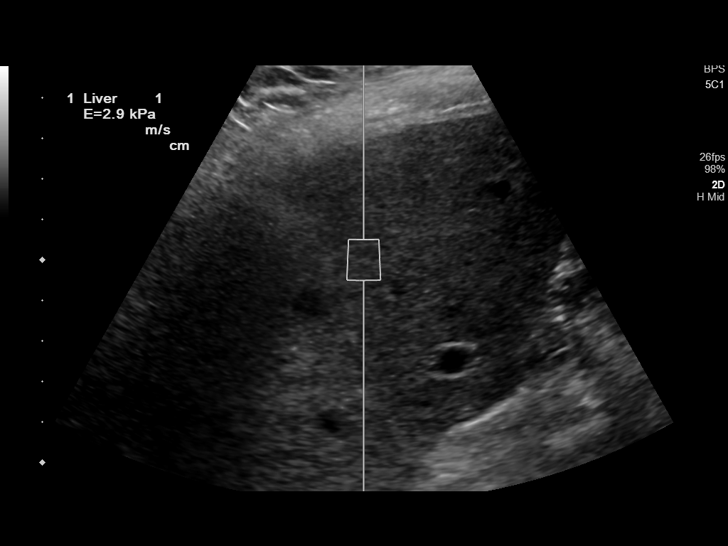

[12 of 25 positions shown; findings below may reference images not displayed]

FINDINGS: ULTRASOUND ABDOMEN

Gallbladder: No gallstones or wall thickening visualized. No
sonographic Murphy sign noted by sonographer.

Common bile duct: Diameter: 6 mm, within normal limits for age.

Liver: Mildly increased echogenicity of the hepatic parenchyma,
consistent with hepatic steatosis. 1.6 cm cyst is seen in the right
lobe. No solid hepatic mass identified. Portal vein is patent on
color Doppler imaging with normal direction of blood flow towards
the liver.

IVC: No abnormality visualized.

Pancreas: Visualized portion unremarkable.

Spleen: Size and appearance within normal limits.

Right Kidney: Length: 10.5 cm. Echogenicity within normal limits. No
mass or hydronephrosis visualized.

Left Kidney: Length: 9.8 cm. Echogenicity within normal limits. No
mass or hydronephrosis visualized.

Abdominal aorta: No aneurysm visualized.

Other findings: None.

ULTRASOUND HEPATIC ELASTOGRAPHY

Device: Siemens Helix VTQ

Patient position: Supine

Transducer [id number]

Number of measurements: 10

Hepatic segment:  8

Median kPa:

IQR:

IQR/Median kPa ratio:

Data quality:  Good

Diagnostic category:  ?5 kPa: high probability of being normal
IMPRESSION: ULTRASOUND ABDOMEN:

Mild diffuse hepatic steatosis and tiny hepatic cyst. No evidence of
hepatic neoplasm.

No evidence of gallstones, biliary ductal dilatation, or other
significant abnormality.

ULTRASOUND HEPATIC ELASTOGRAPHY:

Median kPa:

Diagnostic category:  ?5 kPa: high probability of being normal

The use of hepatic elastography is applicable to patients with viral
hepatitis and non-alcoholic fatty liver disease. At this time, there
is insufficient data for the referenced cut-off values and use in
other causes of liver disease, including alcoholic liver disease.
Patients, however, may be assessed by elastography and serve as
their own reference standard/baseline.

In patients with non-alcoholic liver disease, the values suggesting
compensated advanced chronic liver disease (cACLD) may be lower, and
patients may need additional testing with elasticity results of [DATE]
kPa.

Please note that abnormal hepatic elasticity and shear wave
velocities may also be identified in clinical settings other than
with hepatic fibrosis, such as: acute hepatitis, elevated right
heart and central venous pressures including use of beta blockers,
Stambaugh disease ([place]), infiltrative processes such as
mastocytosis/amyloidosis/infiltrative tumor/lymphoma, extrahepatic
cholestasis, with hyperemia in the post-prandial state, and with
liver transplantation. Correlation with patient history, laboratory
data, and clinical condition recommended.

## 2021-02-10 ENCOUNTER — Ambulatory Visit
Admission: RE | Admit: 2021-02-10 | Discharge: 2021-02-10 | Disposition: A | Discharge status: Home / Self Care | Payer: PPO | Source: Ambulatory Visit | Attending: Family Medicine | Admitting: Family Medicine

## 2021-02-10 ENCOUNTER — Other Ambulatory Visit: Payer: Self-pay

## 2021-02-10 DIAGNOSIS — Z1231 Encounter for screening mammogram for malignant neoplasm of breast: Secondary | ICD-10-CM | POA: Diagnosis not present

## 2021-02-13 NOTE — Telephone Encounter (Signed)
Called patient and let know that our office will get in touch with her when she is due for next injections.

## 2021-02-18 ENCOUNTER — Ambulatory Visit: Payer: PPO

## 2021-04-23 ENCOUNTER — Other Ambulatory Visit: Payer: Self-pay

## 2021-04-23 ENCOUNTER — Ambulatory Visit: Payer: PPO | Admitting: Dermatology

## 2021-04-23 DIAGNOSIS — B009 Herpesviral infection, unspecified: Secondary | ICD-10-CM | POA: Diagnosis not present

## 2021-04-23 DIAGNOSIS — L820 Inflamed seborrheic keratosis: Secondary | ICD-10-CM | POA: Diagnosis not present

## 2021-04-23 DIAGNOSIS — L988 Other specified disorders of the skin and subcutaneous tissue: Secondary | ICD-10-CM

## 2021-04-23 DIAGNOSIS — D170 Benign lipomatous neoplasm of skin and subcutaneous tissue of head, face and neck: Secondary | ICD-10-CM

## 2021-04-23 MED ORDER — VALACYCLOVIR HCL 500 MG PO TABS: ORAL_TABLET | ORAL | 2 refills | Status: DC

## 2021-04-23 NOTE — Patient Instructions (Addendum)
Cryotherapy Aftercare  . Wash gently with soap and water everyday.   Marland Kitchen Apply Vaseline and Band-Aid daily until healed.  Prior to procedure, discussed risks of blister formation, small wound, skin dyspigmentation, or rare scar following cryotherapy.   If you have any questions or concerns for your doctor, please call our main line at [phone] and press option 4 to reach your doctor's medical assistant. If no one answers, please leave a voicemail as directed and we will return your call as soon as possible. Messages left after 4 pm will be answered the following business day.   You may also send [place] a message via [place]. We typically respond to MyChart messages within 1-2 business days.  For prescription refills, please ask your pharmacy to contact our office. Our fax number is [phone].  If you have an urgent issue when the clinic is closed that cannot wait until the next business day, you can page your doctor at the number below.    Please note that while we do our best to be available for urgent issues outside of office hours, we are not available 24/7.   If you have an urgent issue and are unable to reach [place], you may choose to seek medical care at your doctor's office, retail clinic, urgent care center, or emergency room.  If you have a medical emergency, please immediately call 911 or go to the emergency department.  Pager Numbers  - Dr. Nehemiah Massed: [phone]  - Dr. Laurence Ferrari: [phone]  - Dr. Nicole Kindred: [phone]  In the event of inclement weather, please call our main line at [phone] for an update on the status of any delays or closures.  Dermatology Medication Tips: Please keep the boxes that topical medications come in in order to help keep track of the instructions about where and how to use these. Pharmacies typically print the medication instructions only on the boxes and not directly on the medication tubes.   If your medication is too expensive, please  contact our office at [phone] option 4 or send [place] a message through [place].   We are unable to tell what your co-pay for medications will be in advance as this is different depending on your insurance coverage. However, we may be able to find a substitute medication at lower cost or fill out paperwork to get insurance to cover a needed medication.   If a prior authorization is required to get your medication covered by your insurance company, please allow [place] 1-2 business days to complete this process.  Drug prices often vary depending on where the prescription is filled and some pharmacies may offer cheaper prices.  The website www.goodrx.com contains coupons for medications through different pharmacies. The prices here do not account for what the cost may be with help from insurance (it may be cheaper with your insurance), but the website can give you the price if you did not use any insurance.  - You can print the associated coupon and take it with your prescription to the pharmacy.  - You may also stop by our office during regular business hours and pick up a GoodRx coupon card.  - If you need your prescription sent electronically to a different pharmacy, notify our office through  [facility] or by phone at [phone] option 4.

## 2021-04-23 NOTE — Progress Notes (Signed)
Follow-Up Visit   Subjective  Angela Bell is a 75 y.o. female who presents for the following: Facial Elastosis (Patient here for consult on Botox and fillers. Patient has done cosmetics in the past with Dr. Woodfin Ganja. Areas of concern are at frown complex, crows feet and around mouth. ) and Skin Problem (Patient here for a crusty spot at right thigh, present for about 6 months. She had an ISK treated at right popliteal fossa last September and there is some residual. ).   The following portions of the chart were reviewed this encounter and updated as appropriate:   Tobacco  Allergies  Meds  Problems  Med Hx  Surg Hx  Fam Hx       Review of Systems:  No other skin or systemic complaints except as noted in HPI or Assessment and Plan.  Objective  Well appearing patient in no apparent distress; mood and affect are within normal limits.  A focused examination was performed including legs, face. Relevant physical exam findings are noted in the Assessment and Plan.  Right Popliteal Fossa x 1, right lateral thigh x 1 (2) Erythematous keratotic or waxy stuck-on papule or plaque.   face Rhytides and volume loss.                      Neck - Posterior Residual rubbery nodule s/p excision with Dr. Ramond Marrow Clear today   Assessment & Plan  Inflamed seborrheic keratosis Right Popliteal Fossa x 1, right lateral thigh x 1  Prior to procedure, discussed risks of blister formation, small wound, skin dyspigmentation, or rare scar following cryotherapy.   If spot at right lateral thigh does not clear, consider biopsy.   Destruction of lesion - Right Popliteal Fossa x 1, right lateral thigh x 1  Destruction method: cryotherapy   Informed consent: discussed and consent obtained   Lesion destroyed using liquid nitrogen: Yes   Cryotherapy cycles:  2 Outcome: patient tolerated procedure well with no complications   Post-procedure details: wound care  instructions given    Elastosis of skin face  Plan 2 syringes of Restylane Lyft at nasolabial folds, Restylane Refyne/Defyne at perioral. Patient prefers to start with 1 syringe of Lyft today. Advised results will not be as dramatic.  Botox 20 units to frown complex, 12 units at each side for crows feet, 2 units at lateral forehead    Botox Injection - face Location: See attached image  Informed consent: Discussed risks (infection, pain, bleeding, bruising, swelling, allergic reaction, paralysis of nearby muscles, eyelid droop, double vision, neck weakness, difficulty breathing, headache, undesirable cosmetic result, and need for additional treatment) and benefits of the procedure, as well as the alternatives.  Informed consent was obtained.  Preparation: The area was cleansed with alcohol.  Procedure Details:  Botox was injected into the dermis with a 30-gauge needle. Pressure applied to any bleeding. Ice packs offered for swelling.  Lot Number:  [id number] Expiration:  03/2023  Total Units Injected:  46  Plan: Patient was instructed to remain upright for 4 hours. Patient was instructed to avoid massaging the face and avoid vigorous exercise for the rest of the day. Tylenol may be used for headache.  Allow 2 weeks before returning to clinic for additional dosing as needed. Patient will call for any problems.   Filling material injection - face Prior to the procedure, the patient's past medical history, allergies and the rare but potential risks and complications were reviewed  with the patient and a signed consent was obtained. Pre and post-treatment care was discussed and instructions provided.  Location: nasolabial folds  Filler Type: Restylate Lyft  Procedure: Lidocaine-tetracaine ointment was applied to treatment areas to achieve good local anesthesia. The area was prepped thoroughly with hibiclens After introducing the needle into the desired treatment area, the syringe plunger  was drawn back to ensure there was no flash of blood prior to injecting the filler in order to minimize risk of intravascular injection and vascular occlusion. After injection of the filler, the treated areas were cleansed and iced to reduce swelling. Post-treatment instructions were reviewed with the patient.       Patient tolerated the procedure well. The patient will call with any problems, questions or concerns prior to their next appointment.   Lipoma of neck Neck - Posterior  Benign-appearing.  Observation.  Call clinic for new or changing lesions.  Recommend daily use of broad spectrum spf 30+ sunscreen to sun-exposed areas.    Herpes simplex Lips  Patient with hx of fever blisters  Start valycyclovir 500mg  BID 24 hours prior to any procedures and continue BID x 5 days total. For outbreaks, take 4po at onset of symptoms and 4 po 12 hours later.   Related Medications valACYclovir (VALTREX) 500 MG tablet Start valycyclovir 500mg  twice a day 24 hours prior to procedure and continue twice daily x 5 days total. For outbreaks, take 4 po at onset of symptoms and 4 po 12 hours later.  Return if symptoms worsen or fail to improve.  Graciella Belton, RMA, am acting as scribe for Forest Gleason, MD .  Documentation: I have reviewed the above documentation for accuracy and completeness, and I agree with the above.  Forest Gleason, MD

## 2021-05-02 ENCOUNTER — Encounter: Payer: Self-pay | Admitting: Dermatology

## 2021-05-05 ENCOUNTER — Encounter: Payer: Self-pay | Admitting: Dermatology

## 2021-05-28 ENCOUNTER — Other Ambulatory Visit: Payer: Self-pay

## 2021-05-28 ENCOUNTER — Ambulatory Visit: Payer: PPO | Admitting: Dermatology

## 2021-05-28 DIAGNOSIS — L988 Other specified disorders of the skin and subcutaneous tissue: Secondary | ICD-10-CM | POA: Diagnosis not present

## 2021-05-28 DIAGNOSIS — B079 Viral wart, unspecified: Secondary | ICD-10-CM | POA: Diagnosis not present

## 2021-05-28 DIAGNOSIS — L814 Other melanin hyperpigmentation: Secondary | ICD-10-CM

## 2021-05-28 DIAGNOSIS — D489 Neoplasm of uncertain behavior, unspecified: Secondary | ICD-10-CM

## 2021-05-28 NOTE — Patient Instructions (Addendum)
Topical retinoid medications like tretinoin/Retin-A, adapalene/Differin, tazarotene/Fabior, and Epiduo/Epiduo Forte can cause dryness and irritation when first started. Only apply a pea-sized amount to the entire affected area. Avoid applying it around the eyes, edges of mouth and creases at the nose. If you experience irritation, use a good moisturizer first and/or apply the medicine less often. If you are doing well with the medicine, you can increase how often you use it until you are applying every night. Be careful with sun protection while using this medication as it can make you sensitive to the sun. This medicine should not be used by pregnant women.   Dermend bruise cream   Cryotherapy Aftercare  Wash gently with soap and water everyday.   Apply Vaseline and Band-Aid daily until healed.   Biopsy Wound Care Instructions  Leave the original bandage on for 24 hours if possible.  If the bandage becomes soaked or soiled before that time, it is OK to remove it and examine the wound.  A small amount of post-operative bleeding is normal.  If excessive bleeding occurs, remove the bandage, place gauze over the site and apply continuous pressure (no peeking) over the area for 30 minutes. If this does not work, please call our clinic as soon as possible or page your doctor if it is after hours.   Once a day, cleanse the wound with soap and water. It is fine to shower. If a thick crust develops you may use a Q-tip dipped into dilute hydrogen peroxide (mix 1:1 with water) to dissolve it.  Hydrogen peroxide can slow the healing process, so use it only as needed.    After washing, apply petroleum jelly (Vaseline) or an antibiotic ointment if your doctor prescribed one for you, followed by a bandage.    For best healing, the wound should be covered with a layer of ointment at all times. If you are not able to keep the area covered with a bandage to hold the ointment in place, this may mean re-applying the  ointment several times a day.  Continue this wound care until the wound has healed and is no longer open.   Itching and mild discomfort is normal during the healing process. However, if you develop pain or severe itching, please call our office.   If you have any discomfort, you can take Tylenol (acetaminophen) or ibuprofen as directed on the bottle. (Please do not take these if you have an allergy to them or cannot take them for another reason).  Some redness, tenderness and white or yellow material in the wound is normal healing.  If the area becomes very sore and red, or develops a thick yellow-green material (pus), it may be infected; please notify us.    If you have stitches, return to clinic as directed to have the stitches removed. You will continue wound care for 2-3 days after the stitches are removed.   Wound healing continues for up to one year following surgery. It is not unusual to experience pain in the scar from time to time during the interval.  If the pain becomes severe or the scar thickens, you should notify the office.    A slight amount of redness in a scar is expected for the first six months.  After six months, the redness will fade and the scar will soften and fade.  The color difference becomes less noticeable with time.  If there are any problems, return for a post-op surgery check at your earliest convenience.  To improve the appearance of the scar, you can use silicone scar gel, cream, or sheets (such as Mederma or Serica) every night for up to one year. These are available over the counter (without a prescription).  Please call our office at [phone] for any questions or concerns.   Instructions for [facility] Medications  One or more of your medications was sent to the [facility] mail order compounding pharmacy. You will receive an email from them and can purchase the medicine through that link. It will then be mailed to your home at the address you  confirmed. If for any reason you do not receive an email from them, please check your spam folder. If you still do not find the email, please let us know. [facility] phone number is [phone].         If you have any questions or concerns for your doctor, please call our main line at [phone] and press option 4 to reach your doctor's medical assistant. If no one answers, please leave a voicemail as directed and we will return your call as soon as possible. Messages left after 4 pm will be answered the following business day.   You may also send [place] a message via [place]. We typically respond to MyChart messages within 1-2 business days.  For prescription refills, please ask your pharmacy to contact our office. Our fax number is [phone].  If you have an urgent issue when the clinic is closed that cannot wait until the next business day, you can page your doctor at the number below.    Please note that while we do our best to be available for urgent issues outside of office hours, we are not available 24/7.   If you have an urgent issue and are unable to reach [place], you may choose to seek medical care at your doctor's office, retail clinic, urgent care center, or emergency room.  If you have a medical emergency, please immediately call 911 or go to the emergency department.  Pager Numbers  - Dr. Nehemiah Massed: [phone]  - Dr. Laurence Ferrari: [phone]  - Dr. Nicole Kindred: [phone]    In the event of inclement weather, please call our main line at [phone] for an update on the status of any delays or closures.  Dermatology Medication Tips: Please keep the boxes that topical medications come in in order to help keep track of the instructions about where and how to use these. Pharmacies typically print the medication instructions only on the boxes and not directly on the medication tubes.   If your medication is too expensive, please contact our office at [phone] option  4 or send [place] a message through [place].   We are unable to tell what your co-pay for medications will be in advance as this is different depending on your insurance coverage. However, we may be able to find a substitute medication at lower cost or fill out paperwork to get insurance to cover a needed medication.   If a prior authorization is required to get your medication covered by your insurance company, please allow [place] 1-2 business days to complete this process.  Drug prices often vary depending on where the prescription is filled and some pharmacies may offer cheaper prices.  The website www.goodrx.com contains coupons for medications through different pharmacies. The prices here do not account for what the cost may be with help from insurance (it may be cheaper with your insurance), but the website can give you the price  if you did not use any insurance.  - You can print the associated coupon and take it with your prescription to the pharmacy.  - You may also stop by our office during regular business hours and pick up a GoodRx coupon card.  - If you need your prescription sent electronically to a different pharmacy, notify our office through [facility] or by phone at [phone] option 4.

## 2021-05-28 NOTE — Progress Notes (Signed)
Follow-Up Visit   Subjective  Angela Bell is a 75 y.o. female who presents for the following: follow up (Patient here today concerning a spot on right leg she had treated with ln2 last appointment. She states spot came back and is red and crusty. ).  She is also bothered by wrinkles and dark spots  The following portions of the chart were reviewed this encounter and updated as appropriate:  Tobacco  Allergies  Meds  Problems  Med Hx  Surg Hx  Fam Hx      Objective  Well appearing patient in no apparent distress; mood and affect are within normal limits.  A focused examination was performed including right thigh, face, and arms. Relevant physical exam findings are noted in the Assessment and Plan.  face Rhytides and volume loss.   right lateral thigh 0.6 cm firm scaly pink papule      Assessment & Plan  Elastosis of skin face  To help with dark spots and fine wrinkles   Apply thin layer every other day   Will prescribe [facility] Anti-Aging Tretinoin 0.025%/Niacinamide/Vitamin C/Vitamin E/Turmeric/Resveratrol with Hyaluronic Acid. Apply pea sized amount nightly to the entire face.  The patient was advised this is not covered by insurance since it is made by a compounding pharmacy. They will receive an email to check out and the medication will be mailed to their home.   Topical retinoid medications like tretinoin can cause dryness and irritation when first started. Only apply a pea-sized amount to the entire affected area. Avoid applying it around the eyes, edges of mouth and creases at the nose. If you experience irritation, use a good moisturizer first and/or apply the medicine less often. If you are doing well with the medicine, you can increase how often you use it until you are applying every night. Be careful with sun protection while using this medication as it can make you sensitive to the sun. This medicine should not be used by pregnant women.     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 Anesthesia: the lesion was anesthetized in a standard fashion   Anesthetic:  1% lidocaine w/ epinephrine 1-100,000 buffered w/ 8.4% NaHCO3 Instrument used: DermaBlade   Hemostasis achieved with: aluminum chloride   Outcome: patient tolerated procedure well   Post-procedure details: wound care instructions given   Additional details:  Mupirocin and a bandage applied  Destruction of lesion  Destruction method: cryotherapy   Informed consent: discussed and consent obtained   Lesion destroyed using liquid nitrogen: Yes   Cryotherapy cycles:  2 Outcome: patient tolerated procedure well with no complications   Post-procedure details: wound care instructions given    Specimen 1 - Surgical pathology Differential Diagnosis: Verruca vs scc vs isk   Check Margins: No  0.6 cm firm scaly pink papule   R/o verruca vs scc vs isk   Cryotherapy to base to help prevent recurrence if this is verruca -  Prior to procedure, discussed risks of blister formation, small wound, skin dyspigmentation, or rare scar following cryotherapy. Recommend Vaseline ointment to treated areas while healing.   Lentigines - Scattered tan macules - Due to sun exposure - Benign-appering, observe - Recommend daily broad spectrum sunscreen  SPF 30+ to sun-exposed areas, reapply every 2 hours as needed. - Call for any changes  Return for keep follow up as scheduled . I, Ruthell Rummage, CMA, am acting as scribe for Forest Gleason, MD.  Documentation: I have reviewed the above documentation for accuracy and completeness, and I agree with the above.  Forest Gleason, MD

## 2021-05-29 ENCOUNTER — Encounter: Payer: Self-pay | Admitting: Dermatology

## 2021-06-09 ENCOUNTER — Encounter: Payer: Self-pay | Admitting: Dermatology

## 2021-06-09 ENCOUNTER — Telehealth: Payer: Self-pay

## 2021-06-09 NOTE — Telephone Encounter (Signed)
-----   Message from Alfonso Patten, MD sent at 06/08/2021  9:48 PM EDT ----- Skin , right lateral thigh VERRUCA VULGARIS, IRRITATED, BASE INVOLVED  This is a WART caused by the human papilloma virus. It is not dangerous but is contagious and can spread to other areas of skin or other people if it is not completely gone. No additional treatment is needed. However, if it comes back, we can freeze it in clinic with liquid nitrogen (a quick in office procedure) or you can also treat it at home with an over the counter salicylic wart treatment (slower).  Please call the office at [phone] or message [place] if you have have any questions.  MAs please call. Thank you!

## 2021-06-23 ENCOUNTER — Telehealth: Payer: Self-pay

## 2021-06-23 NOTE — Telephone Encounter (Signed)
Prolia VOB initiated via [email or web address]  Last OV: 01/09/21 Next OV: 01/09/22 Last Prolia inj: 02/06/21 Next Prolia inj DUE:  08/10/21

## 2021-07-04 ENCOUNTER — Telehealth: Payer: Self-pay

## 2021-07-04 DIAGNOSIS — E890 Postprocedural hypothyroidism: Secondary | ICD-10-CM

## 2021-07-04 DIAGNOSIS — M8589 Other specified disorders of bone density and structure, multiple sites: Secondary | ICD-10-CM

## 2021-07-04 NOTE — Telephone Encounter (Signed)
I do not see bursitis in the list for prolia side effects Reassuring that is responds to the injection  It can cause some general aches and pains however   Labs ordered

## 2021-07-04 NOTE — Addendum Note (Signed)
Addended by: Loura Pardon A on: 07/04/2021 12:59 PM   Modules accepted: Orders

## 2021-07-04 NOTE — Telephone Encounter (Signed)
Call from pt stating she has bursitis in her hip which usually improves with injections.  However, pt states since she got the Prolia shot she has had hip pain.  Pt wants to know would the Prolia shot affect the pain.  Plz advise.   Also, pt is scheduled for thyroid labs on 07/09/21 and is requesting to have Prolia labs too, since she's due for Prolia shot anyway.

## 2021-07-07 NOTE — Telephone Encounter (Signed)
Pt notified of Dr. Marliss Coots comments and verbalized understanding. Pt will still get inj when due but will check with Ortho to see what they say when she sees them tomorrow

## 2021-07-07 NOTE — Telephone Encounter (Signed)
Pt ready for scheduling on or after 08/10/21  Out-of-pocket cost due at time of visit: $255  Primary: [employer] Prolia co-insurance: 20% (approximately $255) Admin fee co-insurance: 0%  Secondary: n/a Prolia co-insurance:  Admin fee co-insurance:   Deductible: does not apply  Prior Auth: not required PA# Valid:

## 2021-07-08 DIAGNOSIS — M7632 Iliotibial band syndrome, left leg: Secondary | ICD-10-CM | POA: Diagnosis not present

## 2021-07-08 DIAGNOSIS — M25552 Pain in left hip: Secondary | ICD-10-CM | POA: Diagnosis not present

## 2021-07-09 ENCOUNTER — Other Ambulatory Visit: Payer: Self-pay

## 2021-07-09 ENCOUNTER — Other Ambulatory Visit (INDEPENDENT_AMBULATORY_CARE_PROVIDER_SITE_OTHER): Payer: PPO

## 2021-07-09 DIAGNOSIS — E890 Postprocedural hypothyroidism: Secondary | ICD-10-CM

## 2021-07-09 DIAGNOSIS — M8589 Other specified disorders of bone density and structure, multiple sites: Secondary | ICD-10-CM | POA: Diagnosis not present

## 2021-07-09 LAB — BASIC METABOLIC PANEL
BUN: 17 mg/dL (ref 6–23)
CO2: 24 mEq/L (ref 19–32)
Calcium: 9.4 mg/dL (ref 8.4–10.5)
Chloride: 104 mEq/L (ref 96–112)
Creatinine, Ser: 0.77 mg/dL (ref 0.40–1.20)
GFR: 76.81 mL/min (ref >60.00)
Glucose, Bld: 94 mg/dL (ref 70–99)
Potassium: 4.4 mEq/L (ref 3.5–5.1)
Sodium: 136 mEq/L (ref 135–145)

## 2021-07-09 LAB — TSH: TSH: 0.87 u[IU]/mL (ref 0.35–5.50)

## 2021-07-10 ENCOUNTER — Encounter: Payer: Self-pay | Admitting: Family Medicine

## 2021-07-16 NOTE — Telephone Encounter (Signed)
Spoke with patient. OOP cost is $300. Lab was done on 07/09/21 Calcium was normal at 9.4 Cr Cl 66.31 mL/min.  Ok to proceed with Prolia. Nurse visit scheduled on 08/12/21

## 2021-07-29 DIAGNOSIS — M25652 Stiffness of left hip, not elsewhere classified: Secondary | ICD-10-CM | POA: Diagnosis not present

## 2021-07-29 DIAGNOSIS — M25551 Pain in right hip: Secondary | ICD-10-CM | POA: Diagnosis not present

## 2021-07-29 DIAGNOSIS — M6281 Muscle weakness (generalized): Secondary | ICD-10-CM | POA: Diagnosis not present

## 2021-07-29 DIAGNOSIS — M25552 Pain in left hip: Secondary | ICD-10-CM | POA: Diagnosis not present

## 2021-07-29 DIAGNOSIS — M25651 Stiffness of right hip, not elsewhere classified: Secondary | ICD-10-CM | POA: Diagnosis not present

## 2021-08-04 DIAGNOSIS — M25652 Stiffness of left hip, not elsewhere classified: Secondary | ICD-10-CM | POA: Diagnosis not present

## 2021-08-04 DIAGNOSIS — M25552 Pain in left hip: Secondary | ICD-10-CM | POA: Diagnosis not present

## 2021-08-04 DIAGNOSIS — M6281 Muscle weakness (generalized): Secondary | ICD-10-CM | POA: Diagnosis not present

## 2021-08-04 DIAGNOSIS — M25651 Stiffness of right hip, not elsewhere classified: Secondary | ICD-10-CM | POA: Diagnosis not present

## 2021-08-04 DIAGNOSIS — M25551 Pain in right hip: Secondary | ICD-10-CM | POA: Diagnosis not present

## 2021-08-07 ENCOUNTER — Other Ambulatory Visit: Payer: Self-pay

## 2021-08-07 ENCOUNTER — Ambulatory Visit (INDEPENDENT_AMBULATORY_CARE_PROVIDER_SITE_OTHER): Payer: Self-pay | Admitting: Dermatology

## 2021-08-07 DIAGNOSIS — L988 Other specified disorders of the skin and subcutaneous tissue: Secondary | ICD-10-CM

## 2021-08-07 NOTE — Progress Notes (Signed)
   Follow-Up Visit   Subjective  Angela Bell is a 75 y.o. female who presents for the following: Procedure (Patient here today for filler. ).  Patient pleased with fillers so far. She is bothered by areas around mouth and lips.   The following portions of the chart were reviewed this encounter and updated as appropriate:   Tobacco  Allergies  Meds  Problems  Med Hx  Surg Hx  Fam Hx      Review of Systems:  No other skin or systemic complaints except as noted in HPI or Assessment and Plan.  Objective  Well appearing patient in no apparent distress; mood and affect are within normal limits.  A focused examination was performed including face. Relevant physical exam findings are noted in the Assessment and Plan.  Head - Anterior (Face) Rhytides and volume loss.                   Assessment & Plan  Elastosis of skin Head - Anterior (Face)  Continue retinoid  Recommend [facility] Discussed peels, laser, fraxel  Filling material injection - Head - Anterior (Face) Prior to the procedure, the patient's past medical history, allergies and the rare but potential risks and complications were reviewed with the patient and a signed consent was obtained. Pre and post-treatment care was discussed and instructions provided.  Location: perioral  Filler Type: Restylane defyne  Procedure: The area was prepped thoroughly with hibiclens After introducing the needle into the desired treatment area, the syringe plunger was drawn back to ensure there was no flash of blood prior to injecting the filler in order to minimize risk of intravascular injection and vascular occlusion. After injection of the filler, the treated areas were cleansed and iced to reduce swelling. Post-treatment instructions were reviewed with the patient.       Patient tolerated the procedure well. The patient will call with any problems, questions or concerns prior to their next  appointment.   Filling material injection - Head - Anterior (Face) Location: See attached image  Informed consent: Discussed risks (infection, pain, bleeding, bruising, swelling, allergic reaction, paralysis of nearby muscles, eyelid droop, double vision, neck weakness, difficulty breathing, headache, undesirable cosmetic result, and need for additional treatment) and benefits of the procedure, as well as the alternatives.  Informed consent was obtained.  Preparation: The area was cleansed with alcohol.  Procedure Details:  Botox was injected into the dermis with a 30-gauge needle. Pressure applied to any bleeding. Ice packs offered for swelling.  Lot Number:  [id number] Expiration:  05/2023  Total Units Injected:  8  Plan: PTylenol may be used for headache.  Allow 2 weeks before returning to clinic for additional dosing as needed. Patient will call for any problems.   No follow-ups on file.  Graciella Belton, RMA, am acting as scribe for Forest Gleason, MD .  Documentation: I have reviewed the above documentation for accuracy and completeness, and I agree with the above.  Forest Gleason, MD

## 2021-08-07 NOTE — Patient Instructions (Addendum)
If you have any questions or concerns for your doctor, please call our main line at [phone] and press option 4 to reach your doctor's medical assistant. If no one answers, please leave a voicemail as directed and we will return your call as soon as possible. Messages left after 4 pm will be answered the following business day.   You may also send [place] a message via [place]. We typically respond to MyChart messages within 1-2 business days.  For prescription refills, please ask your pharmacy to contact our office. Our fax number is [phone].  If you have an urgent issue when the clinic is closed that cannot wait until the next business day, you can page your doctor at the number below.    Please note that while we do our best to be available for urgent issues outside of office hours, we are not available 24/7.   If you have an urgent issue and are unable to reach [place], you may choose to seek medical care at your doctor's office, retail clinic, urgent care center, or emergency room.  If you have a medical emergency, please immediately call 911 or go to the emergency department.  Pager Numbers  - Dr. Nehemiah Massed: [phone]  - Dr. Laurence Ferrari: [phone]  - Dr. Nicole Kindred: [phone]  In the event of inclement weather, please call our main line at [phone] for an update on the status of any delays or closures.  Dermatology Medication Tips: Please keep the boxes that topical medications come in in order to help keep track of the instructions about where and how to use these. Pharmacies typically print the medication instructions only on the boxes and not directly on the medication tubes.   Recommend DerMend bruise formula  If your medication is too expensive, please contact our office at [phone] option 4 or send [place] a message through [place].   We are unable to tell what your co-pay for medications will be in advance as this is different depending on your insurance coverage. However,  we may be able to find a substitute medication at lower cost or fill out paperwork to get insurance to cover a needed medication.   If a prior authorization is required to get your medication covered by your insurance company, please allow [place] 1-2 business days to complete this process.  Drug prices often vary depending on where the prescription is filled and some pharmacies may offer cheaper prices.  The website www.goodrx.com contains coupons for medications through different pharmacies. The prices here do not account for what the cost may be with help from insurance (it may be cheaper with your insurance), but the website can give you the price if you did not use any insurance.  - You can print the associated coupon and take it with your prescription to the pharmacy.  - You may also stop by our office during regular business hours and pick up a GoodRx coupon card.  - If you need your prescription sent electronically to a different pharmacy, notify our office through [facility] or by phone at [phone] option 4.

## 2021-08-11 DIAGNOSIS — M25652 Stiffness of left hip, not elsewhere classified: Secondary | ICD-10-CM | POA: Diagnosis not present

## 2021-08-11 DIAGNOSIS — M25651 Stiffness of right hip, not elsewhere classified: Secondary | ICD-10-CM | POA: Diagnosis not present

## 2021-08-11 DIAGNOSIS — M25552 Pain in left hip: Secondary | ICD-10-CM | POA: Diagnosis not present

## 2021-08-11 DIAGNOSIS — M6281 Muscle weakness (generalized): Secondary | ICD-10-CM | POA: Diagnosis not present

## 2021-08-11 DIAGNOSIS — M25551 Pain in right hip: Secondary | ICD-10-CM | POA: Diagnosis not present

## 2021-08-12 ENCOUNTER — Other Ambulatory Visit: Payer: Self-pay

## 2021-08-12 ENCOUNTER — Ambulatory Visit (INDEPENDENT_AMBULATORY_CARE_PROVIDER_SITE_OTHER): Payer: PPO

## 2021-08-12 DIAGNOSIS — M8589 Other specified disorders of bone density and structure, multiple sites: Secondary | ICD-10-CM | POA: Diagnosis not present

## 2021-08-12 MED ORDER — DENOSUMAB 60 MG/ML ~~LOC~~ SOSY
60.0000 mg | PREFILLED_SYRINGE | Freq: Once | SUBCUTANEOUS | Status: AC
  Administered 2021-08-12: 60 mg via SUBCUTANEOUS

## 2021-08-12 NOTE — Progress Notes (Signed)
Per orders of Dr. Glori Bickers, injection of Prolia given by Pilar Grammes, CMA in Left Arm. Patient tolerated injection well.  Pt says she has had some hip pain. Wondered if it could be related to Prolia as it started after the 1st injection.

## 2021-08-18 ENCOUNTER — Encounter: Payer: Self-pay | Admitting: Dermatology

## 2021-08-18 DIAGNOSIS — M6281 Muscle weakness (generalized): Secondary | ICD-10-CM | POA: Diagnosis not present

## 2021-08-18 DIAGNOSIS — M25552 Pain in left hip: Secondary | ICD-10-CM | POA: Diagnosis not present

## 2021-08-18 DIAGNOSIS — M25652 Stiffness of left hip, not elsewhere classified: Secondary | ICD-10-CM | POA: Diagnosis not present

## 2021-08-18 DIAGNOSIS — M25651 Stiffness of right hip, not elsewhere classified: Secondary | ICD-10-CM | POA: Diagnosis not present

## 2021-08-18 DIAGNOSIS — M25551 Pain in right hip: Secondary | ICD-10-CM | POA: Diagnosis not present

## 2021-09-11 ENCOUNTER — Encounter: Payer: Self-pay | Admitting: General Surgery

## 2021-11-28 DIAGNOSIS — H43813 Vitreous degeneration, bilateral: Secondary | ICD-10-CM | POA: Diagnosis not present

## 2021-11-28 DIAGNOSIS — H26493 Other secondary cataract, bilateral: Secondary | ICD-10-CM | POA: Diagnosis not present

## 2021-11-28 DIAGNOSIS — H52203 Unspecified astigmatism, bilateral: Secondary | ICD-10-CM | POA: Diagnosis not present

## 2021-11-28 DIAGNOSIS — H353132 Nonexudative age-related macular degeneration, bilateral, intermediate dry stage: Secondary | ICD-10-CM | POA: Diagnosis not present

## 2021-12-09 DIAGNOSIS — H26492 Other secondary cataract, left eye: Secondary | ICD-10-CM | POA: Diagnosis not present

## 2021-12-11 ENCOUNTER — Ambulatory Visit: Payer: PPO | Admitting: Dermatology

## 2021-12-11 ENCOUNTER — Other Ambulatory Visit: Payer: Self-pay

## 2021-12-11 DIAGNOSIS — L988 Other specified disorders of the skin and subcutaneous tissue: Secondary | ICD-10-CM

## 2021-12-11 DIAGNOSIS — D485 Neoplasm of uncertain behavior of skin: Secondary | ICD-10-CM

## 2021-12-11 DIAGNOSIS — B079 Viral wart, unspecified: Secondary | ICD-10-CM | POA: Diagnosis not present

## 2021-12-11 NOTE — Patient Instructions (Addendum)
Wound Care Instructions  Cleanse wound gently with soap and water once a day then pat dry with clean gauze. Apply a thing coat of Petrolatum (petroleum jelly, "Vaseline") over the wound (unless you have an allergy to this). We recommend that you use a new, sterile tube of Vaseline. Do not pick or remove scabs. Do not remove the yellow or white "healing tissue" from the base of the wound.  Cover the wound with fresh, clean, nonstick gauze and secure with paper tape. You may use Band-Aids in place of gauze and tape if the would is small enough, but would recommend trimming much of the tape off as there is often too much. Sometimes Band-Aids can irritate the skin.  You should call the office for your biopsy report after 1 week if you have not already been contacted.  If you experience any problems, such as abnormal amounts of bleeding, swelling, significant bruising, significant pain, or evidence of infection, please call the office immediately.  If You Need Anything After Your Visit  If you have any questions or concerns for your doctor, please call our main line at [phone] and press option 4 to reach your doctor's medical assistant. If no one answers, please leave a voicemail as directed and we will return your call as soon as possible. Messages left after 4 pm will be answered the following business day.   You may also send [place] a message via [place]. We typically respond to MyChart messages within 1-2 business days.  For prescription refills, please ask your pharmacy to contact our office. Our fax number is [phone].  If you have an urgent issue when the clinic is closed that cannot wait until the next business day, you can page your doctor at the number below.    Please note that while we do our best to be available for urgent issues outside of office hours, we are not available 24/7.   If you have an urgent issue and are unable to reach [place], you may choose to seek medical care at your  doctor's office, retail clinic, urgent care center, or emergency room.  If you have a medical emergency, please immediately call 911 or go to the emergency department.  Pager Numbers  - Dr. Nehemiah Massed: [phone]  - Dr. Laurence Ferrari: [phone]  - Dr. Nicole Kindred: [phone]  In the event of inclement weather, please call our main line at [phone] for an update on the status of any delays or closures.  Dermatology Medication Tips: Please keep the boxes that topical medications come in in order to help keep track of the instructions about where and how to use these. Pharmacies typically print the medication instructions only on the boxes and not directly on the medication tubes.   If your medication is too expensive, please contact our office at [phone] option 4 or send [place] a message through [place].   We are unable to tell what your co-pay for medications will be in advance as this is different depending on your insurance coverage. However, we may be able to find a substitute medication at lower cost or fill out paperwork to get insurance to cover a needed medication.   If a prior authorization is required to get your medication covered by your insurance company, please allow [place] 1-2 business days to complete this process.  Drug prices often vary depending on where the prescription is filled and some pharmacies may offer cheaper prices.  The website www.goodrx.com contains coupons for medications through different pharmacies. The  prices here do not account for what the cost may be with help from insurance (it may be cheaper with your insurance), but the website can give you the price if you did not use any insurance.  - You can print the associated coupon and take it with your prescription to the pharmacy.  - You may also stop by our office during regular business hours and pick up a GoodRx coupon card.  - If you need your prescription sent electronically to a different pharmacy, notify  our office through [facility] or by phone at [phone] option 4.     Si Usted Necesita Algo Despus de Su Visita  Tambin puede enviarnos un mensaje a travs de Pharmacist, community. Por lo general respondemos a los mensajes de MyChart en el transcurso de 1 a 2 das hbiles.  Para renovar recetas, por favor pida a su farmacia que se ponga en contacto con nuestra oficina. Harland Dingwall de fax es [place] [phone].  Si tiene un asunto urgente cuando la clnica est cerrada y que no puede esperar hasta el siguiente da hbil, puede llamar/localizar a su doctor(a) al nmero que aparece a continuacin.   Por favor, tenga en cuenta que aunque hacemos todo lo posible para estar disponibles para asuntos urgentes fuera del horario de [place], no estamos disponibles las 24 horas del da, los 7 das de la [place].   Si tiene un problema urgente y no puede comunicarse con nosotros, puede optar por buscar atencin mdica  en el consultorio de su doctor(a), en una clnica privada, en un centro de atencin urgente o en una sala de emergencias.  Si tiene Engineering geologist, por favor llame inmediatamente al 911 o vaya a la sala de emergencias.  Nmeros de bper  - Dr. Nehemiah Massed: [phone]  - Dra. Moye: [phone]  - Dra. Nicole Kindred: [phone]  En caso de inclemencias del [place], por favor llame a Johnsie Kindred principal al [phone] para una actualizacin sobre el [place] de cualquier retraso o cierre.  Consejos para la medicacin en dermatologa: Por favor, guarde las cajas en las que vienen los medicamentos de uso tpico para ayudarle a seguir las instrucciones sobre dnde y cmo usarlos. Las farmacias generalmente imprimen las instrucciones del medicamento slo en las cajas y no directamente en los tubos del [place].   Si su medicamento es muy caro, por favor, pngase en contacto con Zigmund Daniel llamando al [phone] y presione la opcin 4 o envenos un mensaje a travs de  Pharmacist, community.   No podemos decirle cul ser su copago por los medicamentos por adelantado ya que esto es diferente dependiendo de la cobertura de su seguro. Sin embargo, es posible que podamos encontrar un medicamento sustituto a Electrical engineer un formulario para que el seguro cubra el medicamento que se considera necesario.   Si se requiere una autorizacin previa para que su compaa de seguros [place] su medicamento, por favor permtanos de 1 a 2 das hbiles para completar este proceso.  Los precios de los medicamentos varan con frecuencia dependiendo del Environmental consultant de dnde se surte la receta y alguna farmacias pueden ofrecer precios ms baratos.  El sitio web www.goodrx.com tiene cupones para medicamentos de Airline pilot. Los precios aqu no tienen en cuenta lo que podra costar con la ayuda del seguro (puede ser ms barato con su seguro), pero el sitio web puede darle el precio si no utiliz Research scientist (physical sciences).  - Puede imprimir el cupn correspondiente y llevarlo con su receta a  la farmacia.  - Tambin puede pasar por nuestra oficina durante el horario de atencin regular y Charity fundraiser una tarjeta de cupones de GoodRx.  - Si necesita que su receta se enve electrnicamente a una farmacia diferente, informe a nuestra oficina a travs de MyChart de [place] o por telfono llamando al [phone] y presione la opcin 4.

## 2021-12-11 NOTE — Progress Notes (Signed)
Follow-Up Visit   Subjective  Angela Bell is a 76 y.o. female who presents for the following: Facial Elastosis (Patient here today for botox and fillers. She did have some laser eye surgery 2 days ago on left eye.) and Follow-up (Patient also following up on biopsy proven wart at right lateral thigh. Patient advises it has gotten larger and is now sore. Prior to biopsy, was treated with LN2.).   The following portions of the chart were reviewed this encounter and updated as appropriate:   Tobacco   Allergies   Meds   Problems   Med Hx   Surg Hx   Fam Hx       Review of Systems:  No other skin or systemic complaints except as noted in HPI or Assessment and Plan.  Objective  Well appearing patient in no apparent distress; mood and affect are within normal limits.  A focused examination was performed including right thigh, face. Relevant physical exam findings are noted in the Assessment and Plan.  face Rhytides and volume loss.                          Right Lateral Thigh 0.6 cm pink papule with cutaneous horn       Assessment & Plan  Elastosis of skin face  Defyne perioral   Filling material injection - face Location: See attached image  Informed consent: Discussed risks (infection, pain, bleeding, bruising, swelling, allergic reaction, paralysis of nearby muscles, eyelid droop, double vision, neck weakness, difficulty breathing, headache, undesirable cosmetic result, and need for additional treatment) and benefits of the procedure, as well as the alternatives.  Informed consent was obtained.  Preparation: The area was cleansed with alcohol.  Procedure Details:  Botox was injected into the dermis with a 30-gauge needle. Pressure applied to any bleeding. Ice packs offered for swelling.  Lot Number:  [id number] Expiration:  12/2023  Total Units Injected:  8  Plan: PTylenol may be used for headache.  Allow 2 weeks before returning to clinic  for additional dosing as needed. Patient will call for any problems.   Filling material injection - face Prior to the procedure, the patient's past medical history, allergies and the rare but potential risks and complications were reviewed with the patient and a signed consent was obtained. Pre and post-treatment care was discussed and instructions provided.  Location: perioral  Filler Type: Restylane defyne  Procedure: The area was prepped thoroughly with Puracyn. After introducing the needle into the desired treatment area, the syringe plunger was drawn back to ensure there was no flash of blood prior to injecting the filler in order to minimize risk of intravascular injection and vascular occlusion. After injection of the filler, the treated areas were cleansed and iced to reduce swelling. Post-treatment instructions were reviewed with the patient.       Patient tolerated the procedure well. The patient will call with any problems, questions or concerns prior to their next appointment.   Neoplasm of uncertain behavior of skin Right Lateral Thigh  Epidermal / dermal shaving  Lesion diameter (cm):  0.6 Informed consent: discussed and consent obtained   Timeout: patient name, date of birth, surgical site, and procedure verified   Patient was prepped and draped in usual sterile fashion: area prepped with isopropyl alcohol. Anesthesia: the lesion was anesthetized in a standard fashion   Anesthetic:  1% lidocaine w/ epinephrine 1-100,000 buffered w/ 8.4% NaHCO3 Instrument used: DermaBlade  Hemostasis achieved with: aluminum chloride and electrodesiccation   Outcome: patient tolerated procedure well   Post-procedure details: wound care instructions given   Additional details:  Mupirocin and a bandage applied  Destruction of lesion  Destruction method: cryotherapy   Informed consent: discussed and consent obtained   Lesion destroyed using liquid nitrogen: Yes   Cryotherapy cycles:   2 Outcome: patient tolerated procedure well with no complications   Post-procedure details: wound care instructions given    Specimen 1 - Surgical pathology Differential Diagnosis: r/o [place] vs Other  Check Margins: No 0.6 cm pink papule with cutaneous horn [id number]   Return for Botox, as scheduled, Filler.  Angela Bell, RMA, am acting as scribe for Forest Gleason, MD .  Documentation: I have reviewed the above documentation for accuracy and completeness, and I agree with the above.  Forest Gleason, MD

## 2021-12-16 ENCOUNTER — Telehealth: Payer: Self-pay

## 2021-12-16 NOTE — Telephone Encounter (Addendum)
Patient informed. JP   ----- Message from Alfonso Patten, MD sent at 12/16/2021 10:58 AM EST ----- Skin , right lateral thigh VERRUCA VULGARIS, IRRITATED, BASE INVOLVED   This is a WART caused by the human papilloma virus. It is not dangerous but is contagious and can spread to other areas of skin or other people if it is not completely gone. No additional treatment is needed. However, if it comes back, we can freeze it in clinic with liquid nitrogen (a quick in office procedure) or you can also treat it at home with an over the counter salicylic wart treatment (slower).  Please call the office at [phone] or message [place] if you have have any questions.  MAs please call. Thank you!

## 2021-12-22 ENCOUNTER — Encounter: Payer: Self-pay | Admitting: Dermatology

## 2021-12-23 DIAGNOSIS — H26491 Other secondary cataract, right eye: Secondary | ICD-10-CM | POA: Diagnosis not present

## 2022-01-01 ENCOUNTER — Telehealth: Payer: Self-pay | Admitting: Family Medicine

## 2022-01-01 DIAGNOSIS — E538 Deficiency of other specified B group vitamins: Secondary | ICD-10-CM

## 2022-01-01 DIAGNOSIS — E890 Postprocedural hypothyroidism: Secondary | ICD-10-CM

## 2022-01-01 DIAGNOSIS — E7800 Pure hypercholesterolemia, unspecified: Secondary | ICD-10-CM

## 2022-01-01 DIAGNOSIS — Z0000 Encounter for general adult medical examination without abnormal findings: Secondary | ICD-10-CM

## 2022-01-01 DIAGNOSIS — R7309 Other abnormal glucose: Secondary | ICD-10-CM

## 2022-01-01 NOTE — Telephone Encounter (Signed)
-----   Message from Ellamae Sia sent at 12/23/2021  7:48 AM EST ----- Regarding: Lab orders for Friday, 2.10.23 Patient is scheduled for CPX labs, please order future labs, Thanks , Karna Christmas

## 2022-01-02 ENCOUNTER — Other Ambulatory Visit: Payer: PPO

## 2022-01-02 DIAGNOSIS — H02055 Trichiasis without entropian left lower eyelid: Secondary | ICD-10-CM | POA: Diagnosis not present

## 2022-01-06 ENCOUNTER — Encounter: Payer: Self-pay | Admitting: Family Medicine

## 2022-01-06 DIAGNOSIS — E890 Postprocedural hypothyroidism: Secondary | ICD-10-CM | POA: Diagnosis not present

## 2022-01-07 ENCOUNTER — Other Ambulatory Visit: Payer: Self-pay

## 2022-01-07 ENCOUNTER — Other Ambulatory Visit (INDEPENDENT_AMBULATORY_CARE_PROVIDER_SITE_OTHER): Payer: PPO

## 2022-01-07 DIAGNOSIS — E7800 Pure hypercholesterolemia, unspecified: Secondary | ICD-10-CM | POA: Diagnosis not present

## 2022-01-07 DIAGNOSIS — Z0000 Encounter for general adult medical examination without abnormal findings: Secondary | ICD-10-CM | POA: Diagnosis not present

## 2022-01-07 DIAGNOSIS — E538 Deficiency of other specified B group vitamins: Secondary | ICD-10-CM

## 2022-01-07 DIAGNOSIS — E890 Postprocedural hypothyroidism: Secondary | ICD-10-CM

## 2022-01-07 DIAGNOSIS — R7309 Other abnormal glucose: Secondary | ICD-10-CM

## 2022-01-07 LAB — CBC WITH DIFFERENTIAL/PLATELET
Basophils Absolute: 0 10*3/uL (ref 0.0–0.1)
Basophils Relative: 0.6 % (ref 0.0–3.0)
Eosinophils Absolute: 0.1 10*3/uL (ref 0.0–0.7)
Eosinophils Relative: 1.4 % (ref 0.0–5.0)
HCT: 39.1 % (ref 36.0–46.0)
Hemoglobin: 12.7 g/dL (ref 12.0–15.0)
Lymphocytes Relative: 24.4 % (ref 12.0–46.0)
Lymphs Abs: 1.1 10*3/uL (ref 0.7–4.0)
MCHC: 32.6 g/dL (ref 30.0–36.0)
MCV: 96.9 fl (ref 78.0–100.0)
Monocytes Absolute: 0.4 10*3/uL (ref 0.1–1.0)
Monocytes Relative: 8.3 % (ref 3.0–12.0)
Neutro Abs: 2.9 10*3/uL (ref 1.4–7.7)
Neutrophils Relative %: 65.3 % (ref 43.0–77.0)
Platelets: 221 10*3/uL (ref 150.0–400.0)
RBC: 4.04 Mil/uL (ref 3.87–5.11)
RDW: 13.2 % (ref 11.5–15.5)
WBC: 4.5 10*3/uL (ref 4.0–10.5)

## 2022-01-07 LAB — TSH: TSH: 9.66 u[IU]/mL — ABNORMAL HIGH (ref 0.35–5.50)

## 2022-01-07 LAB — LIPID PANEL
Cholesterol: 221 mg/dL — ABNORMAL HIGH (ref 0–200)
HDL: 62.3 mg/dL (ref >39.00)
LDL Cholesterol: 138 mg/dL — ABNORMAL HIGH (ref 0–99)
NonHDL: 158.31
Total CHOL/HDL Ratio: 4
Triglycerides: 104 mg/dL (ref 0.0–149.0)
VLDL: 20.8 mg/dL (ref 0.0–40.0)

## 2022-01-07 LAB — COMPREHENSIVE METABOLIC PANEL
ALT: 13 U/L (ref 0–35)
AST: 18 U/L (ref 0–37)
Albumin: 4.2 g/dL (ref 3.5–5.2)
Alkaline Phosphatase: 75 U/L (ref 39–117)
BUN: 10 mg/dL (ref 6–23)
CO2: 26 mEq/L (ref 19–32)
Calcium: 9.2 mg/dL (ref 8.4–10.5)
Chloride: 104 mEq/L (ref 96–112)
Creatinine, Ser: 0.77 mg/dL (ref 0.40–1.20)
GFR: 76.54 mL/min (ref >60.00)
Glucose, Bld: 87 mg/dL (ref 70–99)
Potassium: 4 mEq/L (ref 3.5–5.1)
Sodium: 140 mEq/L (ref 135–145)
Total Bilirubin: 1 mg/dL (ref 0.2–1.2)
Total Protein: 7.4 g/dL (ref 6.0–8.3)

## 2022-01-07 LAB — HEMOGLOBIN A1C: Hgb A1c MFr Bld: 5.6 % (ref 4.6–6.5)

## 2022-01-07 LAB — VITAMIN B12: Vitamin B-12: 1320 pg/mL — ABNORMAL HIGH (ref 211–911)

## 2022-01-09 ENCOUNTER — Other Ambulatory Visit: Payer: Self-pay

## 2022-01-09 ENCOUNTER — Ambulatory Visit (INDEPENDENT_AMBULATORY_CARE_PROVIDER_SITE_OTHER): Payer: PPO | Admitting: Family Medicine

## 2022-01-09 ENCOUNTER — Encounter: Payer: Self-pay | Admitting: Family Medicine

## 2022-01-09 VITALS — BP 102/60 | HR 83 | Temp 97.7°F | Ht 64.0 in | Wt 144.0 lb

## 2022-01-09 DIAGNOSIS — E7800 Pure hypercholesterolemia, unspecified: Secondary | ICD-10-CM | POA: Diagnosis not present

## 2022-01-09 DIAGNOSIS — E890 Postprocedural hypothyroidism: Secondary | ICD-10-CM

## 2022-01-09 DIAGNOSIS — R7309 Other abnormal glucose: Secondary | ICD-10-CM

## 2022-01-09 DIAGNOSIS — Z1211 Encounter for screening for malignant neoplasm of colon: Secondary | ICD-10-CM

## 2022-01-09 DIAGNOSIS — E559 Vitamin D deficiency, unspecified: Secondary | ICD-10-CM

## 2022-01-09 DIAGNOSIS — Z0000 Encounter for general adult medical examination without abnormal findings: Secondary | ICD-10-CM | POA: Diagnosis not present

## 2022-01-09 DIAGNOSIS — M8589 Other specified disorders of bone density and structure, multiple sites: Secondary | ICD-10-CM

## 2022-01-09 DIAGNOSIS — E538 Deficiency of other specified B group vitamins: Secondary | ICD-10-CM | POA: Diagnosis not present

## 2022-01-09 MED ORDER — EZETIMIBE 10 MG PO TABS: 10.0000 mg | ORAL_TABLET | Freq: Every day | ORAL | 3 refills | Status: DC

## 2022-01-09 MED ORDER — FEXOFENADINE-PSEUDOEPHED ER 60-120 MG PO TB12: 1.0000 | ORAL_TABLET | Freq: Two times a day (BID) | ORAL | 11 refills | Status: DC | PRN

## 2022-01-09 NOTE — Progress Notes (Signed)
Subjective:    Patient ID: Angela Bell, female    DOB: 1946/07/12, 76 y.o.   MRN: [id number]  This visit occurred during the SARS-CoV-2 public health emergency.  Safety protocols were in place, including screening questions prior to the visit, additional usage of staff PPE, and extensive cleaning of exam room while observing appropriate contact time as indicated for disinfecting solutions.   HPI Pt presents for amw and health mt visit   I have personally reviewed the Medicare Annual Wellness questionnaire and have noted 1. The patient's medical and social history 2. Their use of alcohol, tobacco or illicit drugs 3. Their current medications and supplements 4. The patient's functional ability including ADL's, fall risks, home safety risks and hearing or visual             impairment. 5. Diet and physical activities 6. Evidence for depression or mood disorders  The patients weight, height, BMI have been recorded in the chart and visual acuity is per eye clinic.  I have made referrals, counseling and provided education to the patient based review of the above and I have provided the pt with a written personalized care plan for preventive services. Reviewed and updated provider list, see scanned forms.  See scanned forms.  Routine anticipatory guidance given to patient.  See health maintenance. Colon cancer screening  colonoscopy 04/2018 Breast cancer screening  mammogram 01/2021 Self breast exam: no lumps  Flu vaccine  09/2021 Tetanus vaccine  08/2018 Td Pneumovax up to date  Zoster vaccine : is interested in the vaccine  Dexa 04/2020 osteopenia  On prolia - doing ok with it  Falls - none  Fractures-none Supplements-vitamin D Exercise - indoors and out  Walking  Exercises for legs (PT for her hip bursitis)    Advance directive up to date  Cognitive function addressed- see scanned forms- and if abnormal then additional documentation follows.   No worrisome changes  Occ  misplaces something Handles affairs/works full time   PMH and SH reviewed  Meds, vitals, and allergies reviewed.   ROS: See HPI.  Otherwise negative.    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 Hearing Screening   500Hz  1000Hz  2000Hz  4000Hz   Right ear 25 25 20  40  Left ear 25 25 20 20    Vision Screening   Right eye Left eye Both eyes  Without correction 20/20 20/20 20/20   With correction         Eye exam in January -has some macular degeneration /on a supplement  Goes every year   PHQ: score of 0  ADLS: no help needed  Functionality: very good   Care team  Chaunice [facility]- dermatology [facility]endocrinology   Goes to dermatology regularly   BP Readings from Last 3 Encounters:  01/09/22 102/60  01/09/21 112/64  12/30/20 106/62   Pulse Readings from Last 3 Encounters:  01/09/22 83  01/09/21 (!) 56  12/30/20 72      Hypothyroidism  Pt has no clinical changes No change in energy level/ hair or skin/ edema and no tremor Lab Results  Component Value Date   TSH 9.66 (H) 01/07/2022   Levothyroxine 88 mcg-then inc to 100 mcg (was formerly on name brand and changed to generic)  She skipped some days so this is why   No biotin     H/o fatty liver Sees GI Lab Results  Component Value Date   ALT 13 01/07/2022   AST 18 01/07/2022   ALKPHOS 75 01/07/2022   BILITOT 1.0 01/07/2022   B12 def Lab Results  Component Value Date   VITAMINB12 1,320 (H) 01/07/2022   Takes 500 mcg daily oral  She increased it and will drop back   Elevated glucose Lab Results  Component Value Date   HGBA1C 5.6 01/07/2022    Hyperlipidemia Lab Results  Component Value Date   CHOL 221 (H) 01/07/2022   CHOL 203 (H) 12/30/2020   CHOL 177 02/12/2020   Lab Results  Component Value Date   HDL 62.30 01/07/2022   HDL 57.20 12/30/2020   HDL 56.50 02/12/2020   Lab Results  Component Value Date   LDLCALC 138 (H) 01/07/2022   LDLCALC 125 (H) 12/30/2020   LDLCALC 104 (H) 02/12/2020   Lab Results  Component Value Date   TRIG 104.0 01/07/2022   TRIG 105.0 12/30/2020   TRIG 80.0 02/12/2020   Lab Results  Component Value Date   CHOLHDL 4 01/07/2022   CHOLHDL 4 12/30/2020   CHOLHDL 3 02/12/2020   Lab Results  Component Value Date   LDLDIRECT 146.3 08/22/2013   LDLDIRECT 128.6 04/19/2012   Intolerant of statin even intermittent crestor  Diet is excellent    Patient Active Problem List   Diagnosis Date Noted   Fall on concrete 12/30/2020   Head contusion 12/30/2020   Hyperlipidemia 01/01/2020   History of cold sores 01/01/2020   Loose stools 04/14/2019   External hemorrhoids 04/14/2019   Elevated random blood glucose level 04/14/2019   Tachycardia 04/14/2019   Screening mammogram, encounter for 12/26/2018   Fatty liver 12/26/2018   Colon cancer screening 12/03/2017   Encounter for hepatitis C screening test for low risk patient 12/01/2016   Estrogen deficiency 12/01/2016   B12 deficiency 12/01/2016   Routine general medical examination at a health care facility 11/20/2015   Encounter for Medicare annual wellness exam 08/21/2013   Dyspepsia 04/25/2012   Encounter for screening mammogram for breast cancer 12/22/2011   ALLERGIC RHINITIS 12/14/2008   Vitamin D deficiency 08/23/2008   Hypothyroid 07/26/2008   Osteopenia 07/23/2008   Past Medical History:  Diagnosis Date   Allergy    allergic rhinitis   B12 deficiency    Back pain    disc disease lumbar   Colon polyp    Dyspepsia    Fatty liver    GERD (gastroesophageal reflux disease)    Graves' disease    Hyperlipidemia     Hyperthyroidism    Grave's disease (tx with radioactive I)   Hypothyroidism    Osteopenia    Vitamin D deficiency    Yeast vaginitis    recurrent- keeps terazol on hand / otc does not work   Past Surgical History:  Procedure Laterality Date   BREAST SURGERY     breast reduction   CARPAL TUNNEL RELEASE     CATARACT EXTRACTION, BILATERAL     CESAREAN SECTION     x 2   chin implant     COLONOSCOPY     COLONOSCOPY WITH PROPOFOL N/A 04/25/2018   Procedure: COLONOSCOPY WITH PROPOFOL;  Surgeon: Gaylyn Cheers  T, MD;  Location: [facility] ENDOSCOPY;  Service: Endoscopy;  Laterality: N/A;   ESOPHAGOGASTRODUODENOSCOPY (EGD) WITH PROPOFOL N/A 04/25/2018   Procedure: ESOPHAGOGASTRODUODENOSCOPY (EGD) WITH PROPOFOL;  Surgeon: Manya Silvas, MD;  Location: [facility] ENDOSCOPY;  Service: Endoscopy;  Laterality: N/A;   EYE SURGERY     x3 for strabismus   REDUCTION MAMMAPLASTY Bilateral 1983   Social History   Tobacco Use   Smoking status: Former    Types: Cigarettes    Quit date: 11/23/1981    Years since quitting: 40.1   Smokeless tobacco: Never  Vaping Use   Vaping Use: Never used  Substance Use Topics   Alcohol use: Yes    Alcohol/week: 0.0 standard drinks    Comment: rare   Drug use: No   Family History  Problem Relation Age of Onset   Osteoporosis Mother    Heart disease Mother        mild heart problem   Breast cancer Cousin    Arthritis Father        RA   Allergies  Allergen Reactions   Boniva [Ibandronic Acid]     Leg cramping    Crestor [Rosuvastatin] Other (See Comments)    Joint/ Muscle Aches    Current Outpatient Medications on File Prior to Visit  Medication Sig Dispense Refill   ascorbic acid (VITAMIN C) 100 MG tablet Take by mouth.     Calcium Carbonate-Vit D-Min 600-400 MG-UNIT TABS Take 2 tablets by mouth daily.     Cholecalciferol (VITAMIN D3) 5000 UNITS CAPS Take 1 capsule by mouth daily.     denosumab (PROLIA) 60 MG/ML SOSY injection Inject 60 mg into the skin  every 6 (six) months.     famotidine (PEPCID) 20 MG tablet Take 20 mg by mouth as needed for heartburn or indigestion.     Fluticasone Propionate (FLONASE NA) Place into the nose as needed.     hydrocortisone 1 % ointment APPLY EXTERNALLY TO HEMORRHOIDS ONCE DAILY AS NEEDED 28 g 1   levothyroxine (SYNTHROID) 100 MCG tablet Take 100 mcg by mouth daily.     Liver Extract (LIVER PO) Take 1 tablet by mouth every other day.      MISC NATURAL PRODUCTS IJ Inject as directed. Allergy Injection     Omega-3 Fatty Acids (FISH OIL PO) Take 1,500 mg by mouth daily.     valACYclovir (VALTREX) 1000 MG tablet Take 2 pills by mouth twice daily for one day for a cold sore 12 tablet 3   valACYclovir (VALTREX) 500 MG tablet Start valycyclovir 500mg  twice a day 24 hours prior to procedure and continue twice daily x 5 days total. For outbreaks, take 4 po at onset of symptoms and 4 po 12 hours later. 30 tablet 2   No current facility-administered medications on file prior to visit.     Review of Systems  Constitutional:  Negative for activity change, appetite change, fatigue, fever and unexpected weight change.  HENT:  Negative for congestion, ear pain, rhinorrhea, sinus pressure and sore throat.   Eyes:  Negative for pain, redness and visual disturbance.  Respiratory:  Negative for cough, shortness of breath and wheezing.   Cardiovascular:  Negative for chest pain and palpitations.  Gastrointestinal:  Negative for abdominal pain, blood in stool, constipation and diarrhea.  Endocrine: Negative for polydipsia and polyuria.  Genitourinary:  Negative for dysuria, frequency and urgency.  Musculoskeletal:  Negative for arthralgias, back pain and myalgias.  Skin:  Negative for pallor and rash.  Allergic/Immunologic: Negative for environmental allergies.  Neurological:  Negative for dizziness, syncope and headaches.  Hematological:  Negative for adenopathy. Does not bruise/bleed easily.  Psychiatric/Behavioral:   Negative for decreased concentration and dysphoric mood. The patient is not nervous/anxious.       Objective:   Physical Exam Constitutional:      General: She is not in acute distress.    Appearance: Normal appearance. She is well-developed and normal weight. She is not ill-appearing or diaphoretic.  HENT:     Head: Normocephalic and atraumatic.     Right Ear: Tympanic membrane, ear canal and external ear normal.     Left Ear: Tympanic membrane, ear canal and external ear normal.     Nose: Nose normal. No congestion.     Mouth/Throat:     Mouth: Mucous membranes are moist.     Pharynx: Oropharynx is clear. No posterior oropharyngeal erythema.  Eyes:     General: No scleral icterus.    Extraocular Movements: Extraocular movements intact.     Conjunctiva/sclera: Conjunctivae normal.     Pupils: Pupils are equal, round, and reactive to light.  Neck:     Thyroid: No thyromegaly.     Vascular: No carotid bruit or JVD.  Cardiovascular:     Rate and Rhythm: Normal rate and regular rhythm.     Pulses: Normal pulses.     Heart sounds: Normal heart sounds.    No gallop.  Pulmonary:     Effort: Pulmonary effort is normal. No respiratory distress.     Breath sounds: Normal breath sounds. No wheezing.     Comments: Good air exch Chest:     Chest wall: No tenderness.  Abdominal:     General: Bowel sounds are normal. There is no distension or abdominal bruit.     Palpations: Abdomen is soft. There is no mass.     Tenderness: There is no abdominal tenderness.     Hernia: No hernia is present.  Genitourinary:    Comments: Breast exam: No mass, nodules, thickening, tenderness, bulging, retraction, inflamation, nipple discharge or skin changes noted.  No axillary or clavicular LA.     Musculoskeletal:        General: No tenderness. Normal range of motion.     Cervical back: Normal range of motion and neck supple. No rigidity. No muscular tenderness.     Right lower leg: No edema.     Left  lower leg: No edema.  Lymphadenopathy:     Cervical: No cervical adenopathy.  Skin:    General: Skin is warm and dry.     Coloration: Skin is not pale.     Findings: No erythema or rash.     Comments: Solar lentigines diffusely Few sks   Neurological:     Mental Status: She is alert. Mental status is at baseline.     Cranial Nerves: No cranial nerve deficit.     Motor: No abnormal muscle tone.     Coordination: Coordination normal.     Gait: Gait normal.     Deep Tendon Reflexes: Reflexes are normal and symmetric. Reflexes normal.  Psychiatric:        Mood and Affect: Mood normal.        Cognition and Memory: Cognition and memory normal.          Assessment & Plan:   Problem List Items Addressed This Visit       Endocrine   Hypothyroid    Hypothyroidism  Pt has  no clinical changes No change in energy level/ hair or skin/ edema and no tremor Lab Results  Component Value Date   TSH 9.66 (H) 01/07/2022   recenly went up on levothyroxine to 100 mcg (not name brand)        Relevant Medications   levothyroxine (SYNTHROID) 100 MCG tablet     Musculoskeletal and Integument   Osteopenia    dexa 04/2020 On prolia and doing well  No falls or fractures  Taking vit D Pt call for dexa order in June when it is due        Other   B12 deficiency    Lab Results  Component Value Date   VITAMINB12 1,320 (H) 01/07/2022  takes 500 mcg oral daily then inc dose on her own inst to stop this        Colon cancer screening    Colonoscopy 2019      Elevated random blood glucose level    Lab Results  Component Value Date   HGBA1C 5.6 01/07/2022  disc imp of low glycemic diet and wt loss to prevent DM2       Encounter for Medicare annual wellness exam - Primary    Reviewed health habits including diet and exercise and skin cancer prevention Reviewed appropriate screening tests for age  Also reviewed health mt list, fam hx and immunization status , as well as social and  family history   See HPI Labs reviewed  Colonoscopy utd dexa due in June and mammo due in march Declines shingrix vaccine  Taking prolia and vit D, no falls or fx Adv directive is utd No cognitive concerns  Reviewed nl hearing and vision screens PHQ score of 0 Nl functionality and no help needed with ADLs      Hyperlipidemia    Disc goals for lipids and reasons to control them Rev last labs with pt Rev low sat fat diet in detail Intolerant of statin even intermittend crestor LDL of 138 Open to trial of zetia  Future labs planned       Relevant Medications   ezetimibe (ZETIA) 10 MG tablet   Routine general medical examination at a health care facility    Reviewed health habits including diet and exercise and skin cancer prevention Reviewed appropriate screening tests for age  Also reviewed health mt list, fam hx and immunization status , as well as social and family history   See HPI Labs reviewed  Colonoscopy utd dexa due in June and mammo due in march Declines shingrix vaccine  Taking prolia and vit D, no falls or fx Adv directive is utd No cognitive concerns  Reviewed nl hearing and vision screens PHQ score of 0 Nl functionality and no help needed with ADLs

## 2022-01-09 NOTE — Patient Instructions (Addendum)
Hold B12 for 2 weeks Then go back to your old dose  If you are interested in the new shingles vaccine (Shingrix) - call your local pharmacy to check on coverage and availability  If affordable, get on a wait list at your pharmacy to get the vaccine.  Zetia is an option for cholesterol  10 mg daily  If you have side effects stop it and let me know Let's check labs in a month   You are due for bone density test after June Call when you need a referral to schedule it   Check out the apps (Calm and Headspace)  They introduce you to some meditation and mindfulness    If you do become interested in in talk therapy for anxiety   [facility] [address] [place], [place] [address]  [phone]  [facility]/ Dr Lennice Sites Address: [address] [place], [place] [address] Phone: [phone]  [facility] [facility] Address: [address], [place], [place] [address] Phone: [phone]  [facility] [address] [place], [place] [address] [phone]

## 2022-01-11 NOTE — Assessment & Plan Note (Signed)
Reviewed health habits including diet and exercise and skin cancer prevention Reviewed appropriate screening tests for age  Also reviewed health mt list, fam hx and immunization status , as well as social and family history   See HPI Labs reviewed  Colonoscopy utd dexa due in June and mammo due in march Declines shingrix vaccine  Taking prolia and vit D, no falls or fx Adv directive is utd No cognitive concerns  Reviewed nl hearing and vision screens PHQ score of 0 Nl functionality and no help needed with ADLs

## 2022-01-11 NOTE — Assessment & Plan Note (Signed)
Lab Results  Component Value Date   HGBA1C 5.6 01/07/2022   disc imp of low glycemic diet and wt loss to prevent DM2

## 2022-01-11 NOTE — Assessment & Plan Note (Signed)
Colonoscopy 2019.  

## 2022-01-11 NOTE — Assessment & Plan Note (Signed)
Lab Results  Component Value Date   VITAMINB12 1,320 (H) 01/07/2022  takes 500 mcg oral daily then inc dose on her own inst to stop this

## 2022-01-11 NOTE — Assessment & Plan Note (Signed)
Hypothyroidism  Pt has no clinical changes No change in energy level/ hair or skin/ edema and no tremor Lab Results  Component Value Date   TSH 9.66 (H) 01/07/2022   recenly went up on levothyroxine to 100 mcg (not name brand)

## 2022-01-11 NOTE — Assessment & Plan Note (Addendum)
dexa 04/2020 On prolia and doing well  No falls or fractures  Taking vit D Pt call for dexa order in June when it is due

## 2022-01-11 NOTE — Assessment & Plan Note (Signed)
Disc goals for lipids and reasons to control them Rev last labs with pt Rev low sat fat diet in detail Intolerant of statin even intermittend crestor LDL of 138 Open to trial of zetia  Future labs planned

## 2022-01-12 ENCOUNTER — Other Ambulatory Visit: Payer: Self-pay | Admitting: Family Medicine

## 2022-01-20 ENCOUNTER — Telehealth: Payer: Self-pay

## 2022-01-20 ENCOUNTER — Other Ambulatory Visit: Payer: Self-pay

## 2022-01-20 ENCOUNTER — Ambulatory Visit (INDEPENDENT_AMBULATORY_CARE_PROVIDER_SITE_OTHER): Payer: Self-pay | Admitting: Dermatology

## 2022-01-20 ENCOUNTER — Encounter: Payer: Self-pay | Admitting: Dermatology

## 2022-01-20 DIAGNOSIS — E7800 Pure hypercholesterolemia, unspecified: Secondary | ICD-10-CM

## 2022-01-20 DIAGNOSIS — M8589 Other specified disorders of bone density and structure, multiple sites: Secondary | ICD-10-CM

## 2022-01-20 DIAGNOSIS — L988 Other specified disorders of the skin and subcutaneous tissue: Secondary | ICD-10-CM

## 2022-01-20 NOTE — Telephone Encounter (Signed)
Benefits submitted via [employer]-I need to call [employer] insurance to get the information Next injection due 02/10/22 or after

## 2022-01-20 NOTE — Progress Notes (Signed)
° °  Follow-Up Visit   Subjective  Angela Bell is a 76 y.o. female who presents for the following: Facial Elastosis (Patient here today for Botox injections. Patient pleased with current dosing. ).   The following portions of the chart were reviewed this encounter and updated as appropriate:   Tobacco   Allergies   Meds   Problems   Med Hx   Surg Hx   Fam Hx       Review of Systems:  No other skin or systemic complaints except as noted in HPI or Assessment and Plan.  Objective  Well appearing patient in no apparent distress; mood and affect are within normal limits.  A focused examination was performed including face. Relevant physical exam findings are noted in the Assessment and Plan.  face Rhytides and volume loss.        Assessment & Plan  Elastosis of skin face  Filling material injection - face Location: See attached image  Informed consent: Discussed risks (infection, pain, bleeding, bruising, swelling, allergic reaction, paralysis of nearby muscles, eyelid droop, double vision, neck weakness, difficulty breathing, headache, undesirable cosmetic result, and need for additional treatment) and benefits of the procedure, as well as the alternatives.  Informed consent was obtained.  Preparation: The area was cleansed with alcohol.  Procedure Details:  Botox was injected into the dermis with a 30-gauge needle. Pressure applied to any bleeding. Ice packs offered for swelling.  Lot Number:  [id number] Expiration:  01/2024  Total Units Injected:  46  Plan: Tylenol may be used for headache.  Allow 2 weeks before returning to clinic for additional dosing as needed. Patient will call for any problems.    Return for as scheduled, Filler, Botox.  Graciella Belton, RMA, am acting as scribe for Forest Gleason, MD .  Documentation: I have reviewed the above documentation for accuracy and completeness, and I agree with the above.  Forest Gleason, MD

## 2022-01-20 NOTE — Patient Instructions (Signed)

## 2022-01-21 NOTE — Telephone Encounter (Signed)
Spoke with Angela Bell with [employer]- patient will owe 20% and NO PA needed. Reference # for the call [id number] ?

## 2022-02-02 DIAGNOSIS — Z86010 Personal history of colonic polyps: Secondary | ICD-10-CM | POA: Diagnosis not present

## 2022-02-02 DIAGNOSIS — K219 Gastro-esophageal reflux disease without esophagitis: Secondary | ICD-10-CM | POA: Diagnosis not present

## 2022-02-02 DIAGNOSIS — K760 Fatty (change of) liver, not elsewhere classified: Secondary | ICD-10-CM | POA: Diagnosis not present

## 2022-02-02 DIAGNOSIS — K649 Unspecified hemorrhoids: Secondary | ICD-10-CM | POA: Diagnosis not present

## 2022-02-03 NOTE — Telephone Encounter (Signed)
Patient advised. ?Lab appointment made 02/06/22 and NV 02/11/22 ? ?Dr Glori Bickers patient already had an appointment scheduled for labs on 02/06/22 she said for cholesterol re check. I was not sure if that was accurate or not if you can review and place order. I added BMP already. Thank you ?

## 2022-02-03 NOTE — Addendum Note (Signed)
Addended by: Loura Pardon A on: 02/03/2022 08:27 PM ? ? Modules accepted: Orders ? ?

## 2022-02-03 NOTE — Addendum Note (Signed)
Addended by: Kris Mouton on: 02/03/2022 03:51 PM ? ? Modules accepted: Orders ? ?

## 2022-02-04 NOTE — Addendum Note (Signed)
Addended by: Carter Kitten on: 02/04/2022 10:23 AM ? ? Modules accepted: Orders ? ?

## 2022-02-05 ENCOUNTER — Telehealth: Payer: Self-pay | Admitting: Family Medicine

## 2022-02-05 NOTE — Telephone Encounter (Signed)
-----   Message from Ellamae Sia sent at 01/29/2022 10:34 AM EST ----- ?Regarding: Lab orders for Friday, 3.17.23 ?Lab orders, thanks ? ?

## 2022-02-06 ENCOUNTER — Other Ambulatory Visit: Payer: Self-pay

## 2022-02-06 ENCOUNTER — Telehealth: Payer: Self-pay

## 2022-02-06 ENCOUNTER — Other Ambulatory Visit (INDEPENDENT_AMBULATORY_CARE_PROVIDER_SITE_OTHER): Payer: PPO

## 2022-02-06 DIAGNOSIS — E7800 Pure hypercholesterolemia, unspecified: Secondary | ICD-10-CM | POA: Diagnosis not present

## 2022-02-06 DIAGNOSIS — M8589 Other specified disorders of bone density and structure, multiple sites: Secondary | ICD-10-CM | POA: Diagnosis not present

## 2022-02-06 LAB — LIPID PANEL
Cholesterol: 195 mg/dL (ref 0–200)
HDL: 57.7 mg/dL (ref >39.00)
LDL Cholesterol: 117 mg/dL — ABNORMAL HIGH (ref 0–99)
NonHDL: 137.5
Total CHOL/HDL Ratio: 3
Triglycerides: 103 mg/dL (ref 0.0–149.0)
VLDL: 20.6 mg/dL (ref 0.0–40.0)

## 2022-02-06 LAB — BASIC METABOLIC PANEL
BUN: 11 mg/dL (ref 6–23)
CO2: 29 mEq/L (ref 19–32)
Calcium: 8.9 mg/dL (ref 8.4–10.5)
Chloride: 105 mEq/L (ref 96–112)
Creatinine, Ser: 0.76 mg/dL (ref 0.40–1.20)
GFR: 77.71 mL/min (ref >60.00)
Glucose, Bld: 89 mg/dL (ref 70–99)
Potassium: 4.1 mEq/L (ref 3.5–5.1)
Sodium: 139 mEq/L (ref 135–145)

## 2022-02-06 NOTE — Telephone Encounter (Signed)
That is not a typical reaction but just the same, hold zetia 2 weeks to see if it stops and let me know  ?

## 2022-02-06 NOTE — Telephone Encounter (Signed)
Patient notified as instructed by telephone and verbalized understanding. 

## 2022-02-06 NOTE — Telephone Encounter (Signed)
Pt started taking Zetia about 1 month ago. Since starting, she is having stiffness in her toes and ball of feet. Has to "working it out". Asking what she should do? ?

## 2022-02-09 ENCOUNTER — Encounter: Payer: Self-pay | Admitting: Family Medicine

## 2022-02-09 DIAGNOSIS — E7800 Pure hypercholesterolemia, unspecified: Secondary | ICD-10-CM

## 2022-02-11 ENCOUNTER — Ambulatory Visit (INDEPENDENT_AMBULATORY_CARE_PROVIDER_SITE_OTHER): Payer: PPO

## 2022-02-11 ENCOUNTER — Other Ambulatory Visit: Payer: Self-pay

## 2022-02-11 DIAGNOSIS — M8589 Other specified disorders of bone density and structure, multiple sites: Secondary | ICD-10-CM

## 2022-02-11 MED ORDER — DENOSUMAB 60 MG/ML ~~LOC~~ SOSY
60.0000 mg | PREFILLED_SYRINGE | Freq: Once | SUBCUTANEOUS | Status: AC
  Administered 2022-02-11: 60 mg via SUBCUTANEOUS

## 2022-02-11 NOTE — Progress Notes (Signed)
Pt came in this  morning to receive Prolia injection, pt was given Injection successfully in the rt arm subq.  This is patient 3rd injection.  ?

## 2022-02-11 NOTE — Telephone Encounter (Signed)
Cr Cl is  67.96 mL/min as of labs done on 02/06/22 ?Calcium normal at 8.9 ?

## 2022-02-23 ENCOUNTER — Other Ambulatory Visit: Payer: Self-pay | Admitting: Family Medicine

## 2022-02-23 ENCOUNTER — Encounter: Payer: Self-pay | Admitting: Family Medicine

## 2022-03-09 DIAGNOSIS — E890 Postprocedural hypothyroidism: Secondary | ICD-10-CM | POA: Diagnosis not present

## 2022-03-11 DIAGNOSIS — E890 Postprocedural hypothyroidism: Secondary | ICD-10-CM | POA: Diagnosis not present

## 2022-03-13 DIAGNOSIS — M1611 Unilateral primary osteoarthritis, right hip: Secondary | ICD-10-CM | POA: Diagnosis not present

## 2022-03-13 DIAGNOSIS — M7061 Trochanteric bursitis, right hip: Secondary | ICD-10-CM | POA: Diagnosis not present

## 2022-03-23 ENCOUNTER — Other Ambulatory Visit: Payer: Self-pay | Admitting: Family Medicine

## 2022-03-23 NOTE — Telephone Encounter (Signed)
Last filled on 04/19/20 #28 g with 1 refill, CPE was on 01/09/22 and pt has next year's CPE scheduled ?

## 2022-03-27 NOTE — Progress Notes (Signed)
Cardiology Office Note ? ?Date:  03/30/2022  ? ?ID:  Angela Bell, DOB Mar 19, 1946, MRN [id number] ? ?PCP:  Abner Greenspan, MD  ? ?Chief Complaint  ?Patient presents with  ? New Patient (Initial Visit)  ?  Ref by Dr. Glori Bickers for pure hypercholesterolemia. Patient c/o shortness of breath and fluttering in chest at times. Medications reviewed by the patient verbally.   ? ? ?HPI:  ?Angela Bell is a 76 year old woman with past medical history of ?Thyroid disease followed by endocrine, post ablative hypothyroidism on levothyroxine ?Fatty liver, followed by GI, GERD ?Hyperlipidemia ?Who presents by referral from Dr. Glori Bickers for consultation of her hyperlipidemia ? ?On her visit today reports she has had difficulty managing her hyperlipidemia ?Previously tried Crestor ?Reports having significant side effects, muscle and joint ache ? ?Was changed to Zetia though unfortunately had side effects ?Zetia caused foot discomfort and, knee pain ?Also feels the Zetia caused foot numbness, toes going numb ?Tried a low-dose but continued to have symptoms ? ?Total cholesterol 195 LDL 117 on the Zetia, typically total cholesterol running 220 on no medications ?Normal LFTs ? ?Denies chest pain or shortness of breath on exertion ? ?EKG personally reviewed by myself on todays visit ?Normal sinus rhythm rate 76 bpm no significant ST-T wave changes ? ?PMH:   has a past medical history of Allergy, B12 deficiency, Back pain, Colon polyp, Dyspepsia, Fatty liver, GERD (gastroesophageal reflux disease), Graves' disease, Hyperlipidemia, Hyperthyroidism, Hypothyroidism, Osteopenia, Vitamin D deficiency, and Yeast vaginitis. ? ?PSH:    ?Past Surgical History:  ?Procedure Laterality Date  ? BREAST SURGERY    ? breast reduction  ? CARPAL TUNNEL RELEASE    ? CATARACT EXTRACTION, BILATERAL    ? CESAREAN SECTION    ? x 2  ? chin implant    ? COLONOSCOPY    ? COLONOSCOPY WITH PROPOFOL N/A 04/25/2018  ? Procedure: COLONOSCOPY WITH PROPOFOL;  Surgeon:  Manya Silvas, MD;  Location: [facility] ENDOSCOPY;  Service: Endoscopy;  Laterality: N/A;  ? ESOPHAGOGASTRODUODENOSCOPY (EGD) WITH PROPOFOL N/A 04/25/2018  ? Procedure: ESOPHAGOGASTRODUODENOSCOPY (EGD) WITH PROPOFOL;  Surgeon: Manya Silvas, MD;  Location: [facility] ENDOSCOPY;  Service: Endoscopy;  Laterality: N/A;  ? EYE SURGERY    ? x3 for strabismus  ? REDUCTION MAMMAPLASTY Bilateral 1983  ? ? ?Current Outpatient Medications  ?Medication Sig Dispense Refill  ? ascorbic acid (VITAMIN C) 100 MG tablet Take by mouth.    ? Calcium Carbonate-Vit D-Min 600-400 MG-UNIT TABS Take 2 tablets by mouth daily.    ? Cholecalciferol (VITAMIN D3) 5000 UNITS CAPS Take 1 capsule by mouth daily.    ? denosumab (PROLIA) 60 MG/ML SOSY injection Inject 60 mg into the skin every 6 (six) months.    ? famotidine (PEPCID) 20 MG tablet Take 20 mg by mouth as needed for heartburn or indigestion.    ? fexofenadine-pseudoephedrine (ALLEGRA-D 12 HOUR) 60-120 MG 12 hr tablet Take 1 tablet by mouth 2 (two) times daily as needed. 60 tablet 11  ? Fluticasone Propionate (FLONASE NA) Place into the nose as needed.    ? hydrocortisone 1 % ointment APPLY EXTERNALLY TO HEMORRHOIDS ONCE DAILY AS NEEDED 28.35 g 1  ? levothyroxine (SYNTHROID) 100 MCG tablet Take 100 mcg by mouth daily.    ? Liver Extract (LIVER PO) Take 1 tablet by mouth every other day.     ? Magnesium Oxide 250 MG TABS Take 1 tablet by mouth daily as needed.    ? meloxicam (MOBIC) 15  MG tablet Take 15 mg by mouth daily.    ? MISC NATURAL PRODUCTS IJ Inject as directed. Allergy Injection    ? Omega-3 Fatty Acids (FISH OIL PO) Take 1,500 mg by mouth daily.    ? pantoprazole (PROTONIX) 20 MG tablet 20 mg as needed.    ? valACYclovir (VALTREX) 1000 MG tablet Take 2 pills by mouth twice daily for one day for a cold sore (Patient not taking: Reported on 03/30/2022) 12 tablet 3  ? valACYclovir (VALTREX) 500 MG tablet Start valycyclovir '500mg'$  twice a day 24 hours prior to procedure and continue twice  daily x 5 days total. For outbreaks, take 4 po at onset of symptoms and 4 po 12 hours later. (Patient not taking: Reported on 03/30/2022) 30 tablet 2  ? ?No current facility-administered medications for this visit.  ? ? ?Allergies:   Boniva [ibandronic acid], Crestor [rosuvastatin], and Zetia [ezetimibe]  ? ?Social History:  The patient  reports that she quit smoking about 40 years ago. Her smoking use included cigarettes. She has never used smokeless tobacco. She reports current alcohol use. She reports that she does not use drugs.  ? ?Family History:   family history includes Arthritis in her father; Breast cancer in her cousin; Heart disease in her mother; Osteoporosis in her mother.  ? ? ?Review of Systems: ?Review of Systems  ?Constitutional: Negative.   ?HENT: Negative.    ?Respiratory: Negative.    ?Cardiovascular: Negative.   ?Gastrointestinal: Negative.   ?Musculoskeletal: Negative.   ?Neurological: Negative.   ?Psychiatric/Behavioral: Negative.    ?All other systems reviewed and are negative. ? ? ?PHYSICAL EXAM: ?VS:  BP 112/62 (BP Location: Left Arm, Patient Position: Sitting, Cuff Size: Normal)   Pulse 76   Ht '5\' 5"'$  (1.651 m)   Wt 144 lb 6 oz (65.5 kg)   SpO2 98%   BMI 24.03 kg/m?  , BMI Body mass index is 24.03 kg/m?. ?GEN: Well nourished, well developed, in no acute distress ?HEENT: normal ?Neck: no JVD, carotid bruits, or masses ?Cardiac: RRR; no murmurs, rubs, or gallops,no edema  ?Respiratory:  clear to auscultation bilaterally, normal work of breathing ?GI: soft, nontender, nondistended, + BS ?MS: no deformity or atrophy ?Skin: warm and dry, no rash ?Neuro:  Strength and sensation are intact ?Psych: euthymic mood, full affect ? ?Recent Labs: ?01/07/2022: ALT 13; Hemoglobin 12.7; Platelets 221.0; TSH 9.66 ?02/06/2022: BUN 11; Creatinine, Ser 0.76; Potassium 4.1; Sodium 139  ? ? ?Lipid Panel ?Lab Results  ?Component Value Date  ? CHOL 195 02/06/2022  ? HDL 57.70 02/06/2022  ? LDLCALC 117 (H)  02/06/2022  ? TRIG 103.0 02/06/2022  ? ?  ? ?Wt Readings from Last 3 Encounters:  ?03/30/22 144 lb 6 oz (65.5 kg)  ?01/09/22 144 lb (65.3 kg)  ?01/09/21 140 lb 5 oz (63.6 kg)  ?  ? ? ? ?ASSESSMENT AND PLAN: ? ?Problem List Items Addressed This Visit   ? ?  ? Cardiology Problems  ? Hyperlipidemia - Primary  ? Relevant Orders  ? CT CARDIAC SCORING (SELF PAY ONLY)  ?  ? Other  ? Hypothyroid  ? Fatty liver  ? Tachycardia  ? Relevant Orders  ? EKG 12-Lead  ? Dyspepsia  ? ?Hyperlipidemia/cardiac risk factors ?Remote smoker, not currently ?Has mildly elevated cholesterol 220 when not on medication ?Nondiabetic ?Difficulty tolerating statins even Zetia secondary to side effects ?We have recommended CT coronary calcium scoring for further risk stratification ?For elevated calcium score could consider a PCSK9  inhibitor ? ? ? Total encounter time more than 50 minutes ? Greater than 50% was spent in counseling and coordination of care with the patient ? ? ? ?Signed, ?Esmond Plants, M.D., Ph.D. ?[facility] [place], [place] ?[phone] ?

## 2022-03-30 ENCOUNTER — Encounter: Payer: Self-pay | Admitting: Cardiovascular Disease

## 2022-03-30 ENCOUNTER — Ambulatory Visit: Payer: PPO | Admitting: Cardiovascular Disease

## 2022-03-30 VITALS — BP 112/62 | HR 76 | Ht 65.0 in | Wt 144.4 lb

## 2022-03-30 DIAGNOSIS — R1013 Epigastric pain: Secondary | ICD-10-CM | POA: Diagnosis not present

## 2022-03-30 DIAGNOSIS — E782 Mixed hyperlipidemia: Secondary | ICD-10-CM

## 2022-03-30 DIAGNOSIS — R000 Tachycardia, unspecified: Secondary | ICD-10-CM

## 2022-03-30 DIAGNOSIS — T466X5A Adverse effect of antihyperlipidemic and antiarteriosclerotic drugs, initial encounter: Secondary | ICD-10-CM

## 2022-03-30 DIAGNOSIS — E890 Postprocedural hypothyroidism: Secondary | ICD-10-CM | POA: Diagnosis not present

## 2022-03-30 DIAGNOSIS — M791 Myalgia, unspecified site: Secondary | ICD-10-CM | POA: Diagnosis not present

## 2022-03-30 DIAGNOSIS — K760 Fatty (change of) liver, not elsewhere classified: Secondary | ICD-10-CM

## 2022-03-30 NOTE — Patient Instructions (Addendum)
Medication Instructions:  No changes  If you need a refill on your cardiac medications before your next appointment, please call your pharmacy.   Lab work: No new labs needed  Testing/Procedures:  We will order CT coronary calcium score  $99 at our [facility] Location in [place]  Please call Stephanie at [phone] to schedule   [facility] [address] [place], [place] [address]   Follow-Up: At [facility], you and your health needs are our priority.  As part of our continuing mission to provide you with exceptional heart care, we have created designated Provider Care Teams.  These Care Teams include your primary Cardiologist (physician) and Advanced Practice Providers (APPs -  Physician Assistants and Nurse Practitioners) who all work together to provide you with the care you need, when you need it.  You will need a follow up appointment  as needed  Providers on your designated Care Team:   Christopher Berge, NP Ryan Dunn, PA-C Cadence Furth, PA-C  COVID-19 Vaccine Information can be found at: [email or web address] For questions related to vaccine distribution or appointments, please email [email or web address] or call [phone].   

## 2022-03-31 ENCOUNTER — Ambulatory Visit
Admission: RE | Admit: 2022-03-31 | Discharge: 2022-03-31 | Disposition: A | Discharge status: Home / Self Care | Payer: PPO | Source: Ambulatory Visit | Attending: Cardiovascular Disease | Admitting: Cardiovascular Disease

## 2022-03-31 ENCOUNTER — Other Ambulatory Visit: Payer: Self-pay

## 2022-03-31 DIAGNOSIS — E782 Mixed hyperlipidemia: Secondary | ICD-10-CM | POA: Insufficient documentation

## 2022-04-03 ENCOUNTER — Encounter: Payer: Self-pay | Admitting: Cardiovascular Disease

## 2022-04-03 ENCOUNTER — Telehealth: Payer: Self-pay | Admitting: Emergency Medicine

## 2022-04-03 NOTE — Telephone Encounter (Signed)
"  I would like a call to go over the CT results.  Also, about my back. ?Kinsey.  Thanks" ? ? ?

## 2022-04-03 NOTE — Telephone Encounter (Signed)
Called patient about incoming mychart message.  ? ?Patient wanted to know if she needed to start an injection based on her results. Explained that typically MD would send medication adjustment or start recommendations with result notes, and he hadn't, and with her very low calcium score he likely didn't want to start her on Repatha at this time.  ? ?Patient also asked if she should still have her cholesterol checked next week. I advised her that yes, she should.  ? ?Patient concerned about the incidental finding of a compression fracture in her back on her CT. Advised patient that she should contact her PCP or if she's established with an ortho MD, to let them know of the finding so they could better advise her of what needed to be done.  ? ?Pt verbalized understanding of everything discussed and voiced appreciation for the call.  ?

## 2022-04-10 DIAGNOSIS — M7061 Trochanteric bursitis, right hip: Secondary | ICD-10-CM | POA: Diagnosis not present

## 2022-04-10 DIAGNOSIS — M189 Osteoarthritis of first carpometacarpal joint, unspecified: Secondary | ICD-10-CM | POA: Diagnosis not present

## 2022-04-16 ENCOUNTER — Other Ambulatory Visit (INDEPENDENT_AMBULATORY_CARE_PROVIDER_SITE_OTHER): Payer: PPO

## 2022-04-16 DIAGNOSIS — E7800 Pure hypercholesterolemia, unspecified: Secondary | ICD-10-CM

## 2022-04-16 LAB — LIPID PANEL
Cholesterol: 221 mg/dL — ABNORMAL HIGH (ref 0–200)
HDL: 62.4 mg/dL (ref >39.00)
LDL Cholesterol: 127 mg/dL — ABNORMAL HIGH (ref 0–99)
NonHDL: 158.39
Total CHOL/HDL Ratio: 4
Triglycerides: 158 mg/dL — ABNORMAL HIGH (ref 0.0–149.0)
VLDL: 31.6 mg/dL (ref 0.0–40.0)

## 2022-05-08 DIAGNOSIS — M5136 Other intervertebral disc degeneration, lumbar region: Secondary | ICD-10-CM | POA: Diagnosis not present

## 2022-05-08 DIAGNOSIS — M4856XA Collapsed vertebra, not elsewhere classified, lumbar region, initial encounter for fracture: Secondary | ICD-10-CM | POA: Diagnosis not present

## 2022-05-08 DIAGNOSIS — M545 Low back pain, unspecified: Secondary | ICD-10-CM | POA: Diagnosis not present

## 2022-05-11 DIAGNOSIS — E890 Postprocedural hypothyroidism: Secondary | ICD-10-CM | POA: Diagnosis not present

## 2022-05-18 DIAGNOSIS — E890 Postprocedural hypothyroidism: Secondary | ICD-10-CM | POA: Diagnosis not present

## 2022-05-25 DIAGNOSIS — M7061 Trochanteric bursitis, right hip: Secondary | ICD-10-CM | POA: Diagnosis not present

## 2022-05-25 DIAGNOSIS — M1611 Unilateral primary osteoarthritis, right hip: Secondary | ICD-10-CM | POA: Diagnosis not present

## 2022-05-25 DIAGNOSIS — M25551 Pain in right hip: Secondary | ICD-10-CM | POA: Diagnosis not present

## 2022-06-10 ENCOUNTER — Ambulatory Visit: Payer: PPO | Admitting: Dermatology

## 2022-06-10 DIAGNOSIS — M25551 Pain in right hip: Secondary | ICD-10-CM | POA: Diagnosis not present

## 2022-07-08 ENCOUNTER — Ambulatory Visit (INDEPENDENT_AMBULATORY_CARE_PROVIDER_SITE_OTHER): Payer: Self-pay | Admitting: Dermatology

## 2022-07-08 DIAGNOSIS — L988 Other specified disorders of the skin and subcutaneous tissue: Secondary | ICD-10-CM

## 2022-07-08 NOTE — Progress Notes (Signed)
   Follow-Up Visit   Subjective  Angela Bell is a 76 y.o. female who presents for the following: Facial Elastosis (Patient here today for filler injections. ).    The following portions of the chart were reviewed this encounter and updated as appropriate:   Tobacco  Allergies  Meds  Problems  Med Hx  Surg Hx  Fam Hx      Review of Systems:  No other skin or systemic complaints except as noted in HPI or Assessment and Plan.  Objective  Well appearing patient in no apparent distress; mood and affect are within normal limits.  A focused examination was performed including face. Relevant physical exam findings are noted in the Assessment and Plan.  face Rhytides and volume loss.        Assessment & Plan  Elastosis of skin face  Filling material injection - face Prior to the procedure, the patient's past medical history, allergies and the rare but potential risks and complications were reviewed with the patient and a signed consent was obtained. Pre and post-treatment care was discussed and instructions provided.  Risks including vascular occlusion were discussed.   Location: See attached photo  Filler Type: Restylane defyne and Restylate Lyft  Procedure: Lidocaine-tetracaine ointment was applied to treatment areas to achieve good local anesthesia. The area was prepped thoroughly with Puracyn. After introducing the needle into the desired treatment area, the syringe plunger was drawn back to ensure there was no flash of blood prior to injecting the filler in order to minimize risk of intravascular injection and vascular occlusion. After injection of the filler, the treated areas were cleansed and iced to reduce swelling. Post-treatment instructions were reviewed with the patient.       Patient tolerated the procedure well. The patient will call with any problems, questions or concerns prior to their next appointment.    No follow-ups on file.  Graciella Belton,  RMA, am acting as scribe for Forest Gleason, MD .  Documentation: I have reviewed the above documentation for accuracy and completeness, and I agree with the above.  Forest Gleason, MD

## 2022-07-08 NOTE — Patient Instructions (Signed)
Due to recent changes in healthcare laws, you may see results of your pathology and/or laboratory studies on MyChart before the doctors have had a chance to review them. We understand that in some cases there may be results that are confusing or concerning to you. Please understand that not all results are received at the same time and often the doctors may need to interpret multiple results in order to provide you with the best plan of care or course of treatment. Therefore, we ask that you please give us 2 business days to thoroughly review all your results before contacting the office for clarification. Should we see a critical lab result, you will be contacted sooner.   If You Need Anything After Your Visit  If you have any questions or concerns for your doctor, please call our main line at [phone] and press option 4 to reach your doctor's medical assistant. If no one answers, please leave a voicemail as directed and we will return your call as soon as possible. Messages left after 4 pm will be answered the following business day.   You may also send us a message via MyChart. We typically respond to MyChart messages within 1-2 business days.  For prescription refills, please ask your pharmacy to contact our office. Our fax number is [phone].  If you have an urgent issue when the clinic is closed that cannot wait until the next business day, you can page your doctor at the number below.    Please note that while we do our best to be available for urgent issues outside of office hours, we are not available 24/7.   If you have an urgent issue and are unable to reach us, you may choose to seek medical care at your doctor's office, retail clinic, urgent care center, or emergency room.  If you have a medical emergency, please immediately call 911 or go to the emergency department.  Pager Numbers  - Dr. Kowalski: [phone]  - Dr. Moye: [phone]  - Dr. Stewart:  [phone]  In the event of inclement weather, please call our main line at [phone] for an update on the status of any delays or closures.  Dermatology Medication Tips: Please keep the boxes that topical medications come in in order to help keep track of the instructions about where and how to use these. Pharmacies typically print the medication instructions only on the boxes and not directly on the medication tubes.   If your medication is too expensive, please contact our office at [phone] option 4 or send us a message through MyChart.   We are unable to tell what your co-pay for medications will be in advance as this is different depending on your insurance coverage. However, we may be able to find a substitute medication at lower cost or fill out paperwork to get insurance to cover a needed medication.   If a prior authorization is required to get your medication covered by your insurance company, please allow us 1-2 business days to complete this process.  Drug prices often vary depending on where the prescription is filled and some pharmacies may offer cheaper prices.  The website www.goodrx.com contains coupons for medications through different pharmacies. The prices here do not account for what the cost may be with help from insurance (it may be cheaper with your insurance), but the website can give you the price if you did not use any insurance.  - You can print the associated coupon and take it with   your prescription to the pharmacy.  - You may also stop by our office during regular business hours and pick up a GoodRx coupon card.  - If you need your prescription sent electronically to a different pharmacy, notify our office through [facility] MyChart or by phone at [phone] option 4.     Si Usted Necesita Algo Despus de Su Visita  Tambin puede enviarnos un mensaje a travs de MyChart. Por lo general respondemos a los mensajes de MyChart en el transcurso de 1 a 2  das hbiles.  Para renovar recetas, por favor pida a su farmacia que se ponga en contacto con nuestra oficina. Nuestro nmero de fax es el [phone].  Si tiene un asunto urgente cuando la clnica est cerrada y que no puede esperar hasta el siguiente da hbil, puede llamar/localizar a su doctor(a) al nmero que aparece a continuacin.   Por favor, tenga en cuenta que aunque hacemos todo lo posible para estar disponibles para asuntos urgentes fuera del horario de oficina, no estamos disponibles las 24 horas del da, los 7 das de la semana.   Si tiene un problema urgente y no puede comunicarse con nosotros, puede optar por buscar atencin mdica  en el consultorio de su doctor(a), en una clnica privada, en un centro de atencin urgente o en una sala de emergencias.  Si tiene una emergencia mdica, por favor llame inmediatamente al 911 o vaya a la sala de emergencias.  Nmeros de bper  - Dr. Kowalski: [phone]  - Dra. Moye: [phone]  - Dra. Stewart: [phone]  En caso de inclemencias del tiempo, por favor llame a nuestra lnea principal al [phone] para una actualizacin sobre el estado de cualquier retraso o cierre.  Consejos para la medicacin en dermatologa: Por favor, guarde las cajas en las que vienen los medicamentos de uso tpico para ayudarle a seguir las instrucciones sobre dnde y cmo usarlos. Las farmacias generalmente imprimen las instrucciones del medicamento slo en las cajas y no directamente en los tubos del medicamento.   Si su medicamento es muy caro, por favor, pngase en contacto con nuestra oficina llamando al [phone] y presione la opcin 4 o envenos un mensaje a travs de MyChart.   No podemos decirle cul ser su copago por los medicamentos por adelantado ya que esto es diferente dependiendo de la cobertura de su seguro. Sin embargo, es posible que podamos encontrar un medicamento sustituto a menor costo o llenar un formulario para que el  seguro cubra el medicamento que se considera necesario.   Si se requiere una autorizacin previa para que su compaa de seguros cubra su medicamento, por favor permtanos de 1 a 2 das hbiles para completar este proceso.  Los precios de los medicamentos varan con frecuencia dependiendo del lugar de dnde se surte la receta y alguna farmacias pueden ofrecer precios ms baratos.  El sitio web www.goodrx.com tiene cupones para medicamentos de diferentes farmacias. Los precios aqu no tienen en cuenta lo que podra costar con la ayuda del seguro (puede ser ms barato con su seguro), pero el sitio web puede darle el precio si no utiliz ningn seguro.  - Puede imprimir el cupn correspondiente y llevarlo con su receta a la farmacia.  - Tambin puede pasar por nuestra oficina durante el horario de atencin regular y recoger una tarjeta de cupones de GoodRx.  - Si necesita que su receta se enve electrnicamente a una farmacia diferente, informe a nuestra oficina a travs de MyChart de [place]   o por telfono llamando al [phone] y presione la opcin 4.  

## 2022-07-18 ENCOUNTER — Encounter: Payer: Self-pay | Admitting: Dermatology

## 2022-07-22 ENCOUNTER — Telehealth: Payer: Self-pay

## 2022-07-22 DIAGNOSIS — M8589 Other specified disorders of bone density and structure, multiple sites: Secondary | ICD-10-CM

## 2022-07-22 NOTE — Telephone Encounter (Addendum)
Per [facility]TA owes 20%. Need to speak with patient and set up labs. Next injection due 08/15/22

## 2022-07-23 NOTE — Telephone Encounter (Signed)
Left message to call back to discuss. Also, does [facility] want to [facility] for [employer] still.

## 2022-07-30 NOTE — Addendum Note (Signed)
Addended by: Kris Mouton on: 07/30/2022 04:18 PM   Modules accepted: Orders

## 2022-07-30 NOTE — Telephone Encounter (Signed)
Patient advised. Patient does not use [facility] for [employer]. Lab scheduled for 9/15 and [place] on 08/18/22  Dr Glori Bickers, patient would like to know if she can get her TSH checked when she comes for labs also. She is suppose to see her endo in December but worries that's too long from the time she was seen last for her levels. Patient asked to have this looked at for her and let her know

## 2022-08-07 ENCOUNTER — Other Ambulatory Visit (INDEPENDENT_AMBULATORY_CARE_PROVIDER_SITE_OTHER): Payer: PPO

## 2022-08-07 ENCOUNTER — Telehealth: Payer: Self-pay

## 2022-08-07 ENCOUNTER — Ambulatory Visit (INDEPENDENT_AMBULATORY_CARE_PROVIDER_SITE_OTHER): Payer: PPO

## 2022-08-07 DIAGNOSIS — Z23 Encounter for immunization: Secondary | ICD-10-CM

## 2022-08-07 DIAGNOSIS — M8589 Other specified disorders of bone density and structure, multiple sites: Secondary | ICD-10-CM | POA: Diagnosis not present

## 2022-08-07 LAB — BASIC METABOLIC PANEL
BUN: 15 mg/dL (ref 6–23)
CO2: 30 mEq/L (ref 19–32)
Calcium: 9.7 mg/dL (ref 8.4–10.5)
Chloride: 105 mEq/L (ref 96–112)
Creatinine, Ser: 0.74 mg/dL (ref 0.40–1.20)
GFR: 79.95 mL/min (ref >60.00)
Glucose, Bld: 48 mg/dL — CL (ref 70–99)
Potassium: 4.2 mEq/L (ref 3.5–5.1)
Sodium: 141 mEq/L (ref 135–145)

## 2022-08-07 NOTE — Telephone Encounter (Signed)
Lab was drawn for prolia injection.  Plz check on patient, ensure she's eaten and feeling well.

## 2022-08-07 NOTE — Telephone Encounter (Signed)
Saa from [facility] called with critical glucose of 48. Pt was drawn early this morning. Sent to Dr Danise Mina in Dr Marliss Coots absence.

## 2022-08-07 NOTE — Telephone Encounter (Addendum)
Spoke with pt relaying glucose level.  Asked how she is feeling and if she is eating and drinking water.  Pt states she feels fine and confirms she's eating and drinks plenty of water.  Says her arm is a little sore from flu shot today but that's about it.

## 2022-08-10 ENCOUNTER — Encounter: Payer: Self-pay | Admitting: Family Medicine

## 2022-08-10 NOTE — Telephone Encounter (Signed)
Pt advised GFR is in normal range and pt was fasting at the time of blood draw so I advised her of Dr. Marliss Coots comments on labs

## 2022-08-11 DIAGNOSIS — E890 Postprocedural hypothyroidism: Secondary | ICD-10-CM | POA: Diagnosis not present

## 2022-08-12 NOTE — Telephone Encounter (Signed)
Calcium normal at 9.7 CrCl is 70.01 mL/min

## 2022-08-18 ENCOUNTER — Ambulatory Visit (INDEPENDENT_AMBULATORY_CARE_PROVIDER_SITE_OTHER): Payer: PPO

## 2022-08-18 DIAGNOSIS — M8589 Other specified disorders of bone density and structure, multiple sites: Secondary | ICD-10-CM | POA: Diagnosis not present

## 2022-08-18 MED ORDER — DENOSUMAB 60 MG/ML ~~LOC~~ SOSY
60.0000 mg | PREFILLED_SYRINGE | Freq: Once | SUBCUTANEOUS | Status: AC
  Administered 2022-08-18: 60 mg via SUBCUTANEOUS

## 2022-08-18 NOTE — Progress Notes (Signed)
Per orders of Dr. Loura Pardon, injection of Prolia 60 mg [place] in rt deltoid given by Ozzie Hoyle. Pt recently had injection in lt arm. Patient tolerated injection well.

## 2022-08-28 ENCOUNTER — Other Ambulatory Visit: Payer: Self-pay | Admitting: Family Medicine

## 2022-08-28 DIAGNOSIS — Z1231 Encounter for screening mammogram for malignant neoplasm of breast: Secondary | ICD-10-CM

## 2022-09-29 ENCOUNTER — Ambulatory Visit
Admission: RE | Admit: 2022-09-29 | Discharge: 2022-09-29 | Disposition: A | Discharge status: Home / Self Care | Payer: PPO | Source: Ambulatory Visit | Attending: Family Medicine | Admitting: Family Medicine

## 2022-09-29 DIAGNOSIS — Z1231 Encounter for screening mammogram for malignant neoplasm of breast: Secondary | ICD-10-CM | POA: Insufficient documentation

## 2022-11-05 ENCOUNTER — Ambulatory Visit: Payer: PPO | Admitting: Dermatology

## 2022-11-09 ENCOUNTER — Ambulatory Visit: Payer: PPO | Admitting: Dermatology

## 2022-11-09 ENCOUNTER — Ambulatory Visit
Admission: EM | Admit: 2022-11-09 | Discharge: 2022-11-09 | Disposition: A | Discharge status: Home / Self Care | Payer: PPO | Attending: Urgent Care | Admitting: Urgent Care

## 2022-11-09 DIAGNOSIS — J019 Acute sinusitis, unspecified: Secondary | ICD-10-CM | POA: Diagnosis not present

## 2022-11-09 DIAGNOSIS — J069 Acute upper respiratory infection, unspecified: Secondary | ICD-10-CM | POA: Diagnosis not present

## 2022-11-09 DIAGNOSIS — B9789 Other viral agents as the cause of diseases classified elsewhere: Secondary | ICD-10-CM

## 2022-11-09 MED ORDER — PREDNISONE 20 MG PO TABS: ORAL_TABLET | ORAL | 0 refills | Status: AC

## 2022-11-09 NOTE — ED Notes (Signed)
Triaged by provider  

## 2022-11-09 NOTE — ED Provider Notes (Signed)
Angela Bell    CSN: [id number] Arrival date & time: 11/09/22  1221      History   Chief Complaint No chief complaint on file.   HPI Angela Bell is a 76 y.o. female.   HPI  Triage by provider.  She presents with symptoms since Friday (3 days).  Symptoms include "flulike symptoms", undocumented fever, body aches, chills, sinus congestion, cough.  She endorses severe sinus pressure.  Cough is relieved by use of humidifier at night and OTC medication during the day.  She denies asthma or COPD.  Past Medical History:  Diagnosis Date   Allergy    allergic rhinitis   B12 deficiency    Back pain    disc disease lumbar   Colon polyp    Dyspepsia    Fatty liver    GERD (gastroesophageal reflux disease)    Graves' disease    Hyperlipidemia    Hyperthyroidism    Grave's disease (tx with radioactive I)   Hypothyroidism    Osteopenia    Vitamin D deficiency    Yeast vaginitis    recurrent- keeps terazol on hand / otc does not work    Patient Active Problem List   Diagnosis Date Noted   Fall on concrete 12/30/2020   Head contusion 12/30/2020   Hyperlipidemia 01/01/2020   History of cold sores 01/01/2020   Loose stools 04/14/2019   External hemorrhoids 04/14/2019   Elevated random blood glucose level 04/14/2019   Tachycardia 04/14/2019   Screening mammogram, encounter for 12/26/2018   Fatty liver 12/26/2018   Colon cancer screening 12/03/2017   Encounter for hepatitis C screening test for low risk patient 12/01/2016   Estrogen deficiency 12/01/2016   B12 deficiency 12/01/2016   Routine general medical examination at a health care facility 11/20/2015   Encounter for Medicare annual wellness exam 08/21/2013   Dyspepsia 04/25/2012   Encounter for screening mammogram for breast cancer 12/22/2011   ALLERGIC RHINITIS 12/14/2008   Vitamin D deficiency 08/23/2008   Hypothyroid 07/26/2008   Osteopenia 07/23/2008    Past Surgical History:  Procedure  Laterality Date   BREAST SURGERY     breast reduction   CARPAL TUNNEL RELEASE     CATARACT EXTRACTION, BILATERAL     CESAREAN SECTION     x 2   chin implant     COLONOSCOPY     COLONOSCOPY WITH PROPOFOL N/A 04/25/2018   Procedure: COLONOSCOPY WITH PROPOFOL;  Surgeon: Manya Silvas, MD;  Location: [facility] ENDOSCOPY;  Service: Endoscopy;  Laterality: N/A;   ESOPHAGOGASTRODUODENOSCOPY (EGD) WITH PROPOFOL N/A 04/25/2018   Procedure: ESOPHAGOGASTRODUODENOSCOPY (EGD) WITH PROPOFOL;  Surgeon: Manya Silvas, MD;  Location: [facility] ENDOSCOPY;  Service: Endoscopy;  Laterality: N/A;   EYE SURGERY     x3 for strabismus   REDUCTION MAMMAPLASTY Bilateral 1983    OB History   No obstetric history on file.      Home Medications    Prior to Admission medications   Medication Sig Start Date End Date Taking? Authorizing Provider  ascorbic acid (VITAMIN C) 100 MG tablet Take by mouth.    [provider]  Calcium Carbonate-Vit D-Min 600-400 MG-UNIT TABS Take 2 tablets by mouth daily.    [provider]  Cholecalciferol (VITAMIN D3) 5000 UNITS CAPS Take 1 capsule by mouth daily.    [provider]  denosumab (PROLIA) 60 MG/ML SOSY injection Inject 60 mg into the skin every 6 (six) months.    [provider]  famotidine (PEPCID) 20 MG tablet Take 20 mg by mouth as needed for heartburn or indigestion.    [provider]  fexofenadine-pseudoephedrine (ALLEGRA-D 12 HOUR) 60-120 MG 12 hr tablet Take 1 tablet by mouth 2 (two) times daily as needed. 01/09/22   Tower, Wynelle Fanny, MD  Fluticasone Propionate (FLONASE NA) Place into the nose as needed.    [provider]  hydrocortisone 1 % ointment APPLY EXTERNALLY TO HEMORRHOIDS ONCE DAILY AS NEEDED 03/23/22   Tower, Wynelle Fanny, MD  levothyroxine (SYNTHROID) 100 MCG tablet Take 100 mcg by mouth daily. 01/08/22   [provider]  Liver Extract (LIVER PO) Take 1 tablet by mouth every other day.     [provider]  Magnesium Oxide 250 MG TABS Take 1 tablet by mouth daily as needed.    [provider]  meloxicam (MOBIC) 15 MG tablet Take 15 mg by mouth daily. 03/13/22   [provider]  MISC NATURAL PRODUCTS IJ Inject as directed. Allergy Injection    [provider]  Omega-3 Fatty Acids (FISH OIL PO) Take 1,500 mg by mouth daily.    [provider]  pantoprazole (PROTONIX) 20 MG tablet 20 mg as needed. 02/02/22   [provider]  valACYclovir (VALTREX) 1000 MG tablet Take 2 pills by mouth twice daily for one day for a cold sore Patient not taking: Reported on 03/30/2022 01/01/20   Tower, Wynelle Fanny, MD  valACYclovir (VALTREX) 500 MG tablet Start valycyclovir '500mg'$  twice a day 24 hours prior to procedure and continue twice daily x 5 days total. For outbreaks, take 4 po at onset of symptoms and 4 po 12 hours later. Patient not taking: Reported on 03/30/2022 04/23/21   Alfonso Patten, MD    Family History Family History  Problem Relation Age of Onset   Osteoporosis Mother    Heart disease Mother        mild heart problem   Arthritis Father        RA   Breast cancer Cousin     Social History Social History   Tobacco Use   Smoking status: Former    Types: Cigarettes    Quit date: 11/23/1981    Years since quitting: 40.9   Smokeless tobacco: Never  Vaping Use   Vaping Use: Never used  Substance Use Topics   Alcohol use: Yes    Alcohol/week: 0.0 standard drinks of alcohol    Comment: rare   Drug use: No     Allergies   Boniva [ibandronic acid], Crestor [rosuvastatin], and Zetia [ezetimibe]   Review of Systems Review of Systems   Physical Exam Triage Vital Signs ED Triage Vitals  Enc Vitals Group     BP      Pulse      Resp      Temp      Temp src      SpO2      Weight      Height      Head Circumference      Peak Flow      Pain Score      Pain Loc      Pain Edu?      Excl. in [place]?    No data found.  Updated Vital  Signs There were no vitals taken for this visit.  Visual Acuity Right Eye Distance:   Left Eye Distance:   Bilateral Distance:    Right Eye Near:   Left Eye Near:  Bilateral Near:     Physical Exam Vitals reviewed.  Constitutional:      Appearance: She is ill-appearing.  HENT:     Nose:     Right Sinus: Maxillary sinus tenderness present.     Left Sinus: Maxillary sinus tenderness present.  Cardiovascular:     Rate and Rhythm: Normal rate and regular rhythm.     Pulses: Normal pulses.     Heart sounds: Normal heart sounds.  Pulmonary:     Effort: Pulmonary effort is normal.     Breath sounds: Examination of the right-upper field reveals wheezing. Examination of the left-upper field reveals wheezing. Wheezing present.  Skin:    General: Skin is warm and dry.  Neurological:     General: No focal deficit present.     Mental Status: She is alert and oriented to person, place, and time.  Psychiatric:        Mood and Affect: Mood normal.        Behavior: Behavior normal.      UC Treatments / Results  Labs (all labs ordered are listed, but only abnormal results are displayed) Labs Reviewed - No data to display  EKG   Radiology No results found.  Procedures Procedures (including critical care time)  Medications Ordered in UC Medications - No data to display  Initial Impression / Assessment and Plan / UC Course  I have reviewed the triage vital signs and the nursing notes.  Pertinent labs & imaging results that were available during my care of the patient were reviewed by me and considered in my medical decision making (see chart for details).   Patient is afebrile here without recent antipyretics. Satting well on room air. Overall is ill appearing, though well hydrated and without respiratory distress. Pulmonary exam is remarkable for wheezing in the right upper lobe and lower lobe.  Likely viral process however concerned with development of wheezing and acute  sinus pressure.  Will treat with a course of prednisone which the patient says she is experienced with.  Final Clinical Impressions(s) / UC Diagnoses   Final diagnoses:  None   Discharge Instructions   None    ED Prescriptions   None    PDMP not reviewed this encounter.   Rose Phi, [place] 11/09/22 1334

## 2022-11-09 NOTE — Discharge Instructions (Signed)
Follow up here or with your primary care provider if your symptoms are worsening or not improving with treatment.     

## 2022-11-10 ENCOUNTER — Ambulatory Visit: Payer: PPO | Admitting: Nurse Practitioner

## 2022-11-12 DIAGNOSIS — E890 Postprocedural hypothyroidism: Secondary | ICD-10-CM | POA: Diagnosis not present

## 2022-11-19 DIAGNOSIS — L659 Nonscarring hair loss, unspecified: Secondary | ICD-10-CM | POA: Diagnosis not present

## 2022-11-19 DIAGNOSIS — E890 Postprocedural hypothyroidism: Secondary | ICD-10-CM | POA: Diagnosis not present

## 2022-11-21 ENCOUNTER — Other Ambulatory Visit: Payer: Self-pay | Admitting: Dermatology

## 2022-11-21 DIAGNOSIS — B009 Herpesviral infection, unspecified: Secondary | ICD-10-CM

## 2022-12-08 ENCOUNTER — Encounter: Payer: Self-pay | Admitting: Dermatology

## 2022-12-08 ENCOUNTER — Ambulatory Visit (INDEPENDENT_AMBULATORY_CARE_PROVIDER_SITE_OTHER): Payer: PPO | Admitting: Dermatology

## 2022-12-08 DIAGNOSIS — L988 Other specified disorders of the skin and subcutaneous tissue: Secondary | ICD-10-CM

## 2022-12-08 NOTE — Patient Instructions (Addendum)
Recommend using Arnica for bruising.   Due to recent changes in healthcare laws, you may see results of your pathology and/or laboratory studies on MyChart before the doctors have had a chance to review them. We understand that in some cases there may be results that are confusing or concerning to you. Please understand that not all results are received at the same time and often the doctors may need to interpret multiple results in order to provide you with the best plan of care or course of treatment. Therefore, we ask that you please give [place] 2 business days to thoroughly review all your results before contacting the office for clarification. Should we see a critical lab result, you will be contacted sooner.   If You Need Anything After Your Visit  If you have any questions or concerns for your doctor, please call our main line at [phone] and press option 4 to reach your doctor's medical assistant. If no one answers, please leave a voicemail as directed and we will return your call as soon as possible. Messages left after 4 pm will be answered the following business day.   You may also send [place] a message via [place]. We typically respond to MyChart messages within 1-2 business days.  For prescription refills, please ask your pharmacy to contact our office. Our fax number is [phone].  If you have an urgent issue when the clinic is closed that cannot wait until the next business day, you can page your doctor at the number below.    Please note that while we do our best to be available for urgent issues outside of office hours, we are not available 24/7.   If you have an urgent issue and are unable to reach [place], you may choose to seek medical care at your doctor's office, retail clinic, urgent care center, or emergency room.  If you have a medical emergency, please immediately call 911 or go to the emergency department.  Pager Numbers  - Dr. Nehemiah Massed: [phone]  - Dr. Laurence Ferrari:  [phone]  - Dr. Nicole Kindred: [phone]  In the event of inclement weather, please call our main line at [phone] for an update on the status of any delays or closures.  Dermatology Medication Tips: Please keep the boxes that topical medications come in in order to help keep track of the instructions about where and how to use these. Pharmacies typically print the medication instructions only on the boxes and not directly on the medication tubes.   If your medication is too expensive, please contact our office at [phone] option 4 or send [place] a message through [place].   We are unable to tell what your co-pay for medications will be in advance as this is different depending on your insurance coverage. However, we may be able to find a substitute medication at lower cost or fill out paperwork to get insurance to cover a needed medication.   If a prior authorization is required to get your medication covered by your insurance company, please allow [place] 1-2 business days to complete this process.  Drug prices often vary depending on where the prescription is filled and some pharmacies may offer cheaper prices.  The website www.goodrx.com contains coupons for medications through different pharmacies. The prices here do not account for what the cost may be with help from insurance (it may be cheaper with your insurance), but the website can give you the price if you did not use any insurance.  - You can print  the associated coupon and take it with your prescription to the pharmacy.  - You may also stop by our office during regular business hours and pick up a GoodRx coupon card.  - If you need your prescription sent electronically to a different pharmacy, notify our office through [facility] or by phone at [phone] option 4.     Si Usted Necesita Algo Despus de Su Visita  Tambin puede enviarnos un mensaje a travs de Pharmacist, community. Por lo general respondemos a los mensajes de  MyChart en el transcurso de 1 a 2 das hbiles.  Para renovar recetas, por favor pida a su farmacia que se ponga en contacto con nuestra oficina. Harland Dingwall de fax es [place] [phone].  Si tiene un asunto urgente cuando la clnica est cerrada y que no puede esperar hasta el siguiente da hbil, puede llamar/localizar a su doctor(a) al nmero que aparece a continuacin.   Por favor, tenga en cuenta que aunque hacemos todo lo posible para estar disponibles para asuntos urgentes fuera del horario de [place], no estamos disponibles las 24 horas del da, los 7 das de la [place].   Si tiene un problema urgente y no puede comunicarse con nosotros, puede optar por buscar atencin mdica  en el consultorio de su doctor(a), en una clnica privada, en un centro de atencin urgente o en una sala de emergencias.  Si tiene Engineering geologist, por favor llame inmediatamente al 911 o vaya a la sala de emergencias.  Nmeros de bper  - Dr. Nehemiah Massed: [phone]  - Dra. Moye: [phone]  - Dra. Nicole Kindred: [phone]  En caso de inclemencias del [place], por favor llame a Johnsie Kindred principal al [phone] para una actualizacin sobre el [place] de cualquier retraso o cierre.  Consejos para la medicacin en dermatologa: Por favor, guarde las cajas en las que vienen los medicamentos de uso tpico para ayudarle a seguir las instrucciones sobre dnde y cmo usarlos. Las farmacias generalmente imprimen las instrucciones del medicamento slo en las cajas y no directamente en los tubos del [place].   Si su medicamento es muy caro, por favor, pngase en contacto con Zigmund Daniel llamando al [phone] y presione la opcin 4 o envenos un mensaje a travs de Pharmacist, community.   No podemos decirle cul ser su copago por los medicamentos por adelantado ya que esto es diferente dependiendo de la cobertura de su seguro. Sin embargo, es posible que podamos encontrar un medicamento sustituto a Contractor un formulario para que el seguro cubra el medicamento que se considera necesario.   Si se requiere una autorizacin previa para que su compaa de seguros [place] su medicamento, por favor permtanos de 1 a 2 das hbiles para completar este proceso.  Los precios de los medicamentos varan con frecuencia dependiendo del Environmental consultant de dnde se surte la receta y alguna farmacias pueden ofrecer precios ms baratos.  El sitio web www.goodrx.com tiene cupones para medicamentos de Airline pilot. Los precios aqu no tienen en cuenta lo que podra costar con la ayuda del seguro (puede ser ms barato con su seguro), pero el sitio web puede darle el precio si no utiliz Research scientist (physical sciences).  - Puede imprimir el cupn correspondiente y llevarlo con su receta a la farmacia.  - Tambin puede pasar por nuestra oficina durante el horario de atencin regular y Charity fundraiser una tarjeta de cupones de GoodRx.  - Si necesita que su receta se enve electrnicamente a Chiropodist, informe a nuestra oficina  a travs de MyChart [place] o por telfono llamando al [phone] y presione la opcin 4.

## 2022-12-08 NOTE — Progress Notes (Addendum)
   Follow-Up Visit   Subjective  Angela Bell is a 77 y.o. female who presents for the following: Facial Elastosis (Patient here for filler ).  The following portions of the chart were reviewed this encounter and updated as appropriate:   Tobacco  Allergies  Meds  Problems  Med Hx  Surg Hx  Fam Hx      Review of Systems:  No other skin or systemic complaints except as noted in HPI or Assessment and Plan.  Objective  Well appearing patient in no apparent distress; mood and affect are within normal limits.  A focused examination was performed including face. Relevant physical exam findings are noted in the Assessment and Plan.  face Rhytides and volume loss.                    Assessment & Plan  Elastosis of skin face  Restylane Defyne Filler injected today to marionette lines  Recommend mid-face filler for better correction at the lower face.   Filling material injection - face Prior to the procedure, the patient's past medical history, allergies and the rare but potential risks and complications were reviewed with the patient and a signed consent was obtained. Pre and post-treatment care was discussed and instructions provided.  Location: marionette lines  Filler Type: Restylane defyne  Lot: [id number] Exp: 03/23/2023  Procedure: The area was prepped thoroughly with Puracyn. After introducing the needle into the desired treatment area, the syringe plunger was drawn back to ensure there was no flash of blood prior to injecting the filler in order to minimize risk of intravascular injection and vascular occlusion. After injection of the filler, the treated areas were cleansed and iced to reduce swelling. Post-treatment instructions were reviewed with the patient.       Patient tolerated the procedure well. The patient will call with any problems, questions or concerns prior to their next appointment.     Return for Fillers, [place]uma in 1-2  months.  I, Emelia Salisbury, CMA, am acting as scribe for Forest Gleason, MD.   Documentation: I have reviewed the above documentation for accuracy and completeness, and I agree with the above.  Forest Gleason, MD

## 2022-12-09 ENCOUNTER — Ambulatory Visit
Admission: RE | Admit: 2022-12-09 | Discharge: 2022-12-09 | Disposition: A | Discharge status: Home / Self Care | Payer: PPO | Source: Ambulatory Visit | Attending: Emergency Medicine | Admitting: Emergency Medicine

## 2022-12-09 ENCOUNTER — Ambulatory Visit
Admission: RE | Admit: 2022-12-09 | Discharge: 2022-12-09 | Disposition: A | Discharge status: Home / Self Care | Payer: PPO | Source: Ambulatory Visit | Attending: Family Medicine | Admitting: Family Medicine

## 2022-12-09 VITALS — BP 110/74 | HR 95 | Temp 98.2°F | Resp 18 | Ht 65.0 in | Wt 145.0 lb

## 2022-12-09 DIAGNOSIS — J020 Streptococcal pharyngitis: Secondary | ICD-10-CM | POA: Diagnosis not present

## 2022-12-09 DIAGNOSIS — J0100 Acute maxillary sinusitis, unspecified: Secondary | ICD-10-CM

## 2022-12-09 LAB — POCT RAPID STREP A (OFFICE): Rapid Strep A Screen: POSITIVE — AB

## 2022-12-09 MED ORDER — AMOXICILLIN 875 MG PO TABS: 875.0000 mg | ORAL_TABLET | Freq: Two times a day (BID) | ORAL | 0 refills | Status: AC

## 2022-12-09 NOTE — ED Triage Notes (Signed)
Patient to Urgent Care with complaints of sore throat and a sinus pressure. Reports a lot of post nasal drip and drainage that is irritating her throat and making it difficult to sleep. Grandchildren positive for strep.  Reports symptoms started three weeks ago.  Treated with prednisone three weeks ago. Using a humidifier at night/ mucinex/ allegra D.

## 2022-12-09 NOTE — ED Provider Notes (Signed)
[name]    CSN: [id number] Arrival date & time: 12/09/22  1454      History   Chief Complaint Chief Complaint  Patient presents with   Sore Throat    Sinus infection - Entered by patient   Facial Pain    HPI Angela Bell is a 77 y.o. female.   Patient presents with 3 day history of sore throat.  She reports 3 week history of postnasal drip, sinus pressure, congestion, cough.  Treating symptoms with Mucinex and Allegra; none taken today.  She denies fever, rash, chest pain, shortness of breath, or other symptoms.  Her grandchildren have strep throat.  Patient was seen at this urgent care on 11/09/2022; diagnosed with viral URI with cough and acute viral sinusitis; treated with prednisone.    The history is provided by the patient and medical records.    Past Medical History:  Diagnosis Date   Allergy    allergic rhinitis   B12 deficiency    Back pain    disc disease lumbar   Colon polyp    Dyspepsia    Fatty liver    GERD (gastroesophageal reflux disease)    Graves' disease    Hyperlipidemia    Hyperthyroidism    Grave's disease (tx with radioactive I)   Hypothyroidism    Osteopenia    Vitamin D deficiency    Yeast vaginitis    recurrent- keeps terazol on hand / otc does not work    Patient Active Problem List   Diagnosis Date Noted   Fall on concrete 12/30/2020   Head contusion 12/30/2020   Hyperlipidemia 01/01/2020   History of cold sores 01/01/2020   Loose stools 04/14/2019   External hemorrhoids 04/14/2019   Elevated random blood glucose level 04/14/2019   Tachycardia 04/14/2019   Screening mammogram, encounter for 12/26/2018   Fatty liver 12/26/2018   Colon cancer screening 12/03/2017   Encounter for hepatitis C screening test for low risk patient 12/01/2016   Estrogen deficiency 12/01/2016   B12 deficiency 12/01/2016   Routine general medical examination at a health care facility 11/20/2015   Encounter for Medicare annual wellness  exam 08/21/2013   Dyspepsia 04/25/2012   Encounter for screening mammogram for breast cancer 12/22/2011   ALLERGIC RHINITIS 12/14/2008   Vitamin D deficiency 08/23/2008   Hypothyroid 07/26/2008   Osteopenia 07/23/2008    Past Surgical History:  Procedure Laterality Date   BREAST SURGERY     breast reduction   CARPAL TUNNEL RELEASE     CATARACT EXTRACTION, BILATERAL     CESAREAN SECTION     x 2   chin implant     COLONOSCOPY     COLONOSCOPY WITH PROPOFOL N/A 04/25/2018   Procedure: COLONOSCOPY WITH PROPOFOL;  Surgeon: Manya Silvas, MD;  Location: [facility] ENDOSCOPY;  Service: Endoscopy;  Laterality: N/A;   ESOPHAGOGASTRODUODENOSCOPY (EGD) WITH PROPOFOL N/A 04/25/2018   Procedure: ESOPHAGOGASTRODUODENOSCOPY (EGD) WITH PROPOFOL;  Surgeon: Manya Silvas, MD;  Location: [facility] ENDOSCOPY;  Service: Endoscopy;  Laterality: N/A;   EYE SURGERY     x3 for strabismus   REDUCTION MAMMAPLASTY Bilateral 1983    OB History   No obstetric history on file.      Home Medications    Prior to Admission medications   Medication Sig Start Date End Date Taking? Authorizing Provider  amoxicillin (AMOXIL) 875 MG tablet Take 1 tablet (875 mg total) by mouth 2 (two) times daily for 10 days. 12/09/22 12/19/22 Yes Hall Busing,  Fredderick Phenix, NP  ascorbic acid (VITAMIN C) 100 MG tablet Take by mouth.    [provider]  Calcium Carbonate-Vit D-Min 600-400 MG-UNIT TABS Take 2 tablets by mouth daily.    [provider]  Cholecalciferol (VITAMIN D3) 5000 UNITS CAPS Take 1 capsule by mouth daily.    [provider]  denosumab (PROLIA) 60 MG/ML SOSY injection Inject 60 mg into the skin every 6 (six) months.    [provider]  famotidine (PEPCID) 20 MG tablet Take 20 mg by mouth as needed for heartburn or indigestion.    [provider]  fexofenadine-pseudoephedrine (ALLEGRA-D 12 HOUR) 60-120 MG 12 hr tablet Take 1 tablet by mouth 2 (two) times daily as needed. 01/09/22   Tower,  Wynelle Fanny, MD  Fluticasone Propionate (FLONASE NA) Place into the nose as needed.    [provider]  hydrocortisone 1 % ointment APPLY EXTERNALLY TO HEMORRHOIDS ONCE DAILY AS NEEDED 03/23/22   Tower, Wynelle Fanny, MD  levothyroxine (SYNTHROID) 100 MCG tablet Take 100 mcg by mouth daily. 01/08/22   [provider]  Liver Extract (LIVER PO) Take 1 tablet by mouth every other day.     [provider]  Magnesium Oxide 250 MG TABS Take 1 tablet by mouth daily as needed.    [provider]  meloxicam (MOBIC) 15 MG tablet Take 15 mg by mouth daily. 03/13/22   [provider]  MISC NATURAL PRODUCTS IJ Inject as directed. Allergy Injection    [provider]  Omega-3 Fatty Acids (FISH OIL PO) Take 1,500 mg by mouth daily.    [provider]  pantoprazole (PROTONIX) 20 MG tablet 20 mg as needed. 02/02/22   [provider]  valACYclovir (VALTREX) 500 MG tablet START VALYCYCLOVIR '500MG'$  TWICE A DAY 24 HOURS PRIOR TO PROCEDURE AND CONTINUE TWICE DAILY X 5 DAYS TOTAL. FOR OUTBREAKS, TAKE 4 BY MOUTH AT ONSET OF SYMPTOMS AND 4 PO 12 HOURS LATER. 11/24/22   Moye, [place], MD    Family History Family History  Problem Relation Age of Onset   Osteoporosis Mother    Heart disease Mother        mild heart problem   Arthritis Father        RA   Breast cancer Cousin     Social History Social History   Tobacco Use   Smoking status: Former    Types: Cigarettes    Quit date: 11/23/1981    Years since quitting: 41.0   Smokeless tobacco: Never  Vaping Use   Vaping Use: Never used  Substance Use Topics   Alcohol use: Not Currently    Comment: rare   Drug use: No     Allergies   Boniva [ibandronic acid], Crestor [rosuvastatin], and Zetia [ezetimibe]   Review of Systems Review of Systems  Constitutional:  Negative for chills and fever.  HENT:  Positive for congestion, postnasal drip, rhinorrhea and sore throat. Negative for ear pain.    Respiratory:  Positive for cough. Negative for shortness of breath.   Cardiovascular:  Negative for chest pain and palpitations.  Gastrointestinal:  Negative for diarrhea and vomiting.  Skin:  Negative for color change and rash.  All other systems reviewed and are negative.    Physical Exam Triage Vital Signs ED Triage Vitals  Enc Vitals Group     BP      Pulse      Resp      Temp  Temp src      SpO2      Weight      Height      Head Circumference      Peak Flow      Pain Score      Pain Loc      Pain Edu?      Excl. in [place]?    No data found.  Updated Vital Signs BP 110/74   Pulse 95   Temp 98.2 F (36.8 C)   Resp 18   Ht '5\' 5"'$  (1.651 m)   Wt 145 lb (65.8 kg)   SpO2 96%   BMI 24.13 kg/m   Visual Acuity Right Eye Distance:   Left Eye Distance:   Bilateral Distance:    Right Eye Near:   Left Eye Near:    Bilateral Near:     Physical Exam Vitals and nursing note reviewed.  Constitutional:      General: She is not in acute distress.    Appearance: Normal appearance. She is well-developed. She is not ill-appearing.  HENT:     Right Ear: Tympanic membrane normal.     Left Ear: Tympanic membrane normal.     Nose: Congestion present.     Mouth/Throat:     Mouth: Mucous membranes are moist.     Pharynx: Posterior oropharyngeal erythema present.  Cardiovascular:     Rate and Rhythm: Normal rate and regular rhythm.     Heart sounds: Normal heart sounds.  Pulmonary:     Effort: Pulmonary effort is normal. No respiratory distress.     Breath sounds: Normal breath sounds.  Musculoskeletal:     Cervical back: Neck supple.  Skin:    General: Skin is warm and dry.  Neurological:     Mental Status: She is alert.  Psychiatric:        Mood and Affect: Mood normal.        Behavior: Behavior normal.      UC Treatments / Results  Labs (all labs ordered are listed, but only abnormal results are displayed) Labs Reviewed  POCT RAPID STREP A (OFFICE) -  Abnormal; Notable for the following components:      Result Value   Rapid Strep A Screen Positive (*)    All other components within normal limits    EKG   Radiology No results found.  Procedures Procedures (including critical care time)  Medications Ordered in UC Medications - No data to display  Initial Impression / Assessment and Plan / UC Course  I have reviewed the triage vital signs and the nursing notes.  Pertinent labs & imaging results that were available during my care of the patient were reviewed by me and considered in my medical decision making (see chart for details).    Strep pharyngitis, acute sinusitis.  Rapid strep positive.  Treating with amoxicillin.  Discussed symptomatic treatment including Tylenol, rest, hydration.  Instructed patient to follow up with her PCP if symptoms are not improving.  She agrees to plan of care.   Final Clinical Impressions(s) / UC Diagnoses   Final diagnoses:  Strep pharyngitis  Acute non-recurrent maxillary sinusitis     Discharge Instructions      Take the amoxicillin as directed.  Follow up with your primary care provider if your symptoms are not improving.        ED Prescriptions     Medication Sig Dispense Auth. Provider   amoxicillin (AMOXIL) 875 MG tablet Take 1 tablet (937  mg total) by mouth 2 (two) times daily for 10 days. 20 tablet Sharion Balloon, NP      PDMP not reviewed this encounter.   Sharion Balloon, NP 12/09/22 [phone]

## 2022-12-09 NOTE — Discharge Instructions (Addendum)
Take the amoxicillin as directed.  Follow up with your primary care provider if your symptoms are not improving.   ° ° °

## 2022-12-10 ENCOUNTER — Ambulatory Visit: Payer: PPO

## 2022-12-15 ENCOUNTER — Telehealth: Payer: Self-pay | Admitting: Family Medicine

## 2022-12-15 NOTE — Telephone Encounter (Signed)
Patient declined AWV today 

## 2022-12-24 DIAGNOSIS — M25551 Pain in right hip: Secondary | ICD-10-CM | POA: Diagnosis not present

## 2023-01-04 ENCOUNTER — Telehealth: Payer: Self-pay | Admitting: Family Medicine

## 2023-01-04 DIAGNOSIS — R7309 Other abnormal glucose: Secondary | ICD-10-CM

## 2023-01-04 DIAGNOSIS — E782 Mixed hyperlipidemia: Secondary | ICD-10-CM

## 2023-01-04 DIAGNOSIS — R1013 Epigastric pain: Secondary | ICD-10-CM

## 2023-01-04 DIAGNOSIS — E890 Postprocedural hypothyroidism: Secondary | ICD-10-CM

## 2023-01-04 DIAGNOSIS — K760 Fatty (change of) liver, not elsewhere classified: Secondary | ICD-10-CM

## 2023-01-04 DIAGNOSIS — E538 Deficiency of other specified B group vitamins: Secondary | ICD-10-CM

## 2023-01-04 DIAGNOSIS — E559 Vitamin D deficiency, unspecified: Secondary | ICD-10-CM

## 2023-01-04 NOTE — Telephone Encounter (Signed)
-----   Message from Velna Hatchet, RT sent at 12/21/2022  9:51 AM EST ----- Regarding: Tue 2/13 lab Patient is scheduled for cpx, please order future labs.  Thanks, Anda Kraft

## 2023-01-05 ENCOUNTER — Other Ambulatory Visit (INDEPENDENT_AMBULATORY_CARE_PROVIDER_SITE_OTHER): Payer: PPO

## 2023-01-05 DIAGNOSIS — R7309 Other abnormal glucose: Secondary | ICD-10-CM | POA: Diagnosis not present

## 2023-01-05 DIAGNOSIS — E890 Postprocedural hypothyroidism: Secondary | ICD-10-CM

## 2023-01-05 DIAGNOSIS — K760 Fatty (change of) liver, not elsewhere classified: Secondary | ICD-10-CM | POA: Diagnosis not present

## 2023-01-05 DIAGNOSIS — R1013 Epigastric pain: Secondary | ICD-10-CM

## 2023-01-05 DIAGNOSIS — E538 Deficiency of other specified B group vitamins: Secondary | ICD-10-CM

## 2023-01-05 DIAGNOSIS — E782 Mixed hyperlipidemia: Secondary | ICD-10-CM | POA: Diagnosis not present

## 2023-01-05 DIAGNOSIS — E559 Vitamin D deficiency, unspecified: Secondary | ICD-10-CM

## 2023-01-05 LAB — CBC WITH DIFFERENTIAL/PLATELET
Basophils Absolute: 0.1 10*3/uL (ref 0.0–0.1)
Basophils Relative: 0.8 % (ref 0.0–3.0)
Eosinophils Absolute: 0.1 10*3/uL (ref 0.0–0.7)
Eosinophils Relative: 1.3 % (ref 0.0–5.0)
HCT: 38.8 % (ref 36.0–46.0)
Hemoglobin: 12.9 g/dL (ref 12.0–15.0)
Lymphocytes Relative: 23.5 % (ref 12.0–46.0)
Lymphs Abs: 1.7 10*3/uL (ref 0.7–4.0)
MCHC: 33.2 g/dL (ref 30.0–36.0)
MCV: 94.7 fl (ref 78.0–100.0)
Monocytes Absolute: 0.6 10*3/uL (ref 0.1–1.0)
Monocytes Relative: 7.8 % (ref 3.0–12.0)
Neutro Abs: 4.8 10*3/uL (ref 1.4–7.7)
Neutrophils Relative %: 66.6 % (ref 43.0–77.0)
Platelets: 306 10*3/uL (ref 150.0–400.0)
RBC: 4.1 Mil/uL (ref 3.87–5.11)
RDW: 14.2 % (ref 11.5–15.5)
WBC: 7.2 10*3/uL (ref 4.0–10.5)

## 2023-01-05 LAB — LIPID PANEL
Cholesterol: 242 mg/dL — ABNORMAL HIGH (ref 0–200)
HDL: 60.4 mg/dL (ref >39.00)
LDL Cholesterol: 164 mg/dL — ABNORMAL HIGH (ref 0–99)
NonHDL: 181.94
Total CHOL/HDL Ratio: 4
Triglycerides: 90 mg/dL (ref 0.0–149.0)
VLDL: 18 mg/dL (ref 0.0–40.0)

## 2023-01-05 LAB — VITAMIN D 25 HYDROXY (VIT D DEFICIENCY, FRACTURES): VITD: 44.05 ng/mL (ref 30.00–100.00)

## 2023-01-05 LAB — COMPREHENSIVE METABOLIC PANEL
ALT: 12 U/L (ref 0–35)
AST: 13 U/L (ref 0–37)
Albumin: 3.9 g/dL (ref 3.5–5.2)
Alkaline Phosphatase: 93 U/L (ref 39–117)
BUN: 12 mg/dL (ref 6–23)
CO2: 29 mEq/L (ref 19–32)
Calcium: 9.6 mg/dL (ref 8.4–10.5)
Chloride: 102 mEq/L (ref 96–112)
Creatinine, Ser: 0.83 mg/dL (ref 0.40–1.20)
GFR: 69.47 mL/min (ref >60.00)
Glucose, Bld: 85 mg/dL (ref 70–99)
Potassium: 4.1 mEq/L (ref 3.5–5.1)
Sodium: 139 mEq/L (ref 135–145)
Total Bilirubin: 0.9 mg/dL (ref 0.2–1.2)
Total Protein: 6.8 g/dL (ref 6.0–8.3)

## 2023-01-05 LAB — HEMOGLOBIN A1C: Hgb A1c MFr Bld: 5.8 % (ref 4.6–6.5)

## 2023-01-05 LAB — VITAMIN B12: Vitamin B-12: 729 pg/mL (ref 211–911)

## 2023-01-05 LAB — TSH: TSH: 3.27 u[IU]/mL (ref 0.35–5.50)

## 2023-01-11 DIAGNOSIS — H04123 Dry eye syndrome of bilateral lacrimal glands: Secondary | ICD-10-CM | POA: Diagnosis not present

## 2023-01-11 DIAGNOSIS — H0100B Unspecified blepharitis left eye, upper and lower eyelids: Secondary | ICD-10-CM | POA: Diagnosis not present

## 2023-01-11 DIAGNOSIS — H353132 Nonexudative age-related macular degeneration, bilateral, intermediate dry stage: Secondary | ICD-10-CM | POA: Diagnosis not present

## 2023-01-11 DIAGNOSIS — Z961 Presence of intraocular lens: Secondary | ICD-10-CM | POA: Diagnosis not present

## 2023-01-11 DIAGNOSIS — H0100A Unspecified blepharitis right eye, upper and lower eyelids: Secondary | ICD-10-CM | POA: Diagnosis not present

## 2023-01-11 DIAGNOSIS — H02055 Trichiasis without entropian left lower eyelid: Secondary | ICD-10-CM | POA: Diagnosis not present

## 2023-01-11 DIAGNOSIS — H524 Presbyopia: Secondary | ICD-10-CM | POA: Diagnosis not present

## 2023-01-11 DIAGNOSIS — H52203 Unspecified astigmatism, bilateral: Secondary | ICD-10-CM | POA: Diagnosis not present

## 2023-01-12 ENCOUNTER — Ambulatory Visit (INDEPENDENT_AMBULATORY_CARE_PROVIDER_SITE_OTHER): Payer: PPO | Admitting: Family Medicine

## 2023-01-12 ENCOUNTER — Encounter: Payer: Self-pay | Admitting: Family Medicine

## 2023-01-12 VITALS — BP 124/64 | HR 75 | Temp 97.4°F | Ht 63.75 in | Wt 142.5 lb

## 2023-01-12 DIAGNOSIS — R7309 Other abnormal glucose: Secondary | ICD-10-CM

## 2023-01-12 DIAGNOSIS — R21 Rash and other nonspecific skin eruption: Secondary | ICD-10-CM | POA: Insufficient documentation

## 2023-01-12 DIAGNOSIS — M8589 Other specified disorders of bone density and structure, multiple sites: Secondary | ICD-10-CM | POA: Diagnosis not present

## 2023-01-12 DIAGNOSIS — E2839 Other primary ovarian failure: Secondary | ICD-10-CM | POA: Diagnosis not present

## 2023-01-12 DIAGNOSIS — E890 Postprocedural hypothyroidism: Secondary | ICD-10-CM | POA: Diagnosis not present

## 2023-01-12 DIAGNOSIS — K219 Gastro-esophageal reflux disease without esophagitis: Secondary | ICD-10-CM | POA: Diagnosis not present

## 2023-01-12 DIAGNOSIS — E559 Vitamin D deficiency, unspecified: Secondary | ICD-10-CM | POA: Diagnosis not present

## 2023-01-12 DIAGNOSIS — E538 Deficiency of other specified B group vitamins: Secondary | ICD-10-CM

## 2023-01-12 DIAGNOSIS — Z0000 Encounter for general adult medical examination without abnormal findings: Secondary | ICD-10-CM

## 2023-01-12 DIAGNOSIS — E782 Mixed hyperlipidemia: Secondary | ICD-10-CM | POA: Diagnosis not present

## 2023-01-12 DIAGNOSIS — K760 Fatty (change of) liver, not elsewhere classified: Secondary | ICD-10-CM

## 2023-01-12 MED ORDER — ATORVASTATIN CALCIUM 10 MG PO TABS: ORAL_TABLET | ORAL | 11 refills | Status: DC

## 2023-01-12 MED ORDER — ROSUVASTATIN CALCIUM 5 MG PO TABS: ORAL_TABLET | ORAL | 1 refills | Status: DC

## 2023-01-12 NOTE — Progress Notes (Signed)
Subjective:    Patient ID: Angela Bell, female    DOB: March 18, 1946, 77 y.o.   MRN: [id number]  HPI Here for health maintenance exam and to review chronic medical problems    Wt Readings from Last 3 Encounters:  01/12/23 142 lb 8 oz (64.6 kg)  12/09/22 145 lb (65.8 kg)  03/30/22 144 lb 6 oz (65.5 kg)   24.65 kg/m  Vitals:   01/12/23 0820  BP: 124/64  Pulse: 75  Temp: (!) 97.4 F (36.3 C)  SpO2: 99%   Still working full time Loves it   R hip is bothering her  Has injection 4 weeks ago  Needs a hip replacement - has ortho appt upcoming to discuss   Not as active due to that   She had the flu Then got strep throat   Several weeks ago she had some bumps on buttock  Do not hurt Getting better  Does sit in hot tub   Takes vitamins for macular deg - make her bloated   Had 3 bumps on her L buttock Not painful  Immunization History  Administered Date(s) Administered   Fluad Quad(high Dose 65+) 08/22/2020, 08/07/2022   Hepatitis A, Adult 09/12/2018   Hepatitis B 02/10/2018, 03/10/2018   Hepatitis B, ADULT 07/21/2018   Influenza Whole 07/16/2010   Influenza, High Dose Seasonal PF 09/20/2018   Influenza,inj,Quad PF,6+ Mos 08/29/2013, 09/14/2014, 11/06/2016, 09/24/2017   Influenza,inj,quad, With Preservative 08/23/2017   Influenza-Unspecified 10/25/2015, 08/08/2019, 10/17/2021   Moderna Covid-19 Vaccine Bivalent Booster 20yr & up 10/18/2021   PFIZER(Purple Top)SARS-COV-2 Vaccination 01/08/2020, 01/29/2020, 08/25/2020   Pneumococcal Conjugate-13 09/14/2014   Pneumococcal Polysaccharide-23 09/23/2006, 08/29/2013   Td 11/27/2010, 09/12/2018   Tdap 07/21/2018   Zoster, Live 12/11/2010   Health Maintenance Due  Topic Date Due   Zoster Vaccines- Shingrix (1 of 2) Never done   COVID-19 Vaccine (5 - 2023-24 season) 07/24/2022   Medicare Annual Wellness (AWV)  01/09/2023   Shingrix: plans to get it   Declines AMV  Mammogram 09/2022 Self breast exam: no  lumps   No gyn pronlems   Colonoscopy 04/2018  10 y recall   Dexa  2021-worse osteopenia  Intol of boniva in the past  Now taking prolia   (makes her feel bloated right after the shot) Falls: none  Fractures:none  Supplements - takes vit D and a mvi  Eats fair diet /could be better  D level 44   Exercise : limited due to her hip      01/12/2023    8:23 AM 01/09/2022    8:56 AM 01/09/2021    8:22 AM 01/01/2020    9:21 AM 12/19/2018    8:06 AM  Depression screen PHQ 2/9  Decreased Interest 0 0 0 0 0  Down, Depressed, Hopeless 0 0 0 0 0  PHQ - 2 Score 0 0 0 0 0  Altered sleeping 0   1 0  Tired, decreased energy 0   1 0  Change in appetite 0   0 0  Feeling bad or failure about yourself  0   0 0  Trouble concentrating 0   0 0  Moving slowly or fidgety/restless 0   0 0  Suicidal thoughts 0   0 0  PHQ-9 Score 0   2 0  Difficult doing work/chores Not difficult at all   Not difficult at all Not difficult at all   Nl   Hypothyroidism  Pt has no clinical changes No change  in energy level/ hair or skin/ edema and no tremor Lab Results  Component Value Date   TSH 3.27 01/05/2023    Levothyroxine 100 mcg daily  Takes align and antacid  Was not waiting to take it after thyroid med   B12 def Lab Results  Component Value Date   VITAMINB12 729 01/05/2023    Glucose Lab Results  Component Value Date   HGBA1C 5.8 01/05/2023   Is cauttious about added sugar   Hyperlipidemia Lab Results  Component Value Date   CHOL 242 (H) 01/05/2023   CHOL 221 (H) 04/16/2022   CHOL 195 02/06/2022   Lab Results  Component Value Date   HDL 60.40 01/05/2023   HDL 62.40 04/16/2022   HDL 57.70 02/06/2022   Lab Results  Component Value Date   LDLCALC 164 (H) 01/05/2023   LDLCALC 127 (H) 04/16/2022   LDLCALC 117 (H) 02/06/2022   Lab Results  Component Value Date   TRIG 90.0 01/05/2023   TRIG 158.0 (H) 04/16/2022   TRIG 103.0 02/06/2022   Lab Results  Component Value Date    CHOLHDL 4 01/05/2023   CHOLHDL 4 04/16/2022   CHOLHDL 3 02/06/2022   Lab Results  Component Value Date   LDLDIRECT 146.3 08/22/2013   LDLDIRECT 128.6 04/19/2012   Intol of statin  Intol of zetia   Diet is fair  Eats some fatty food twice a month at most   Cardiac calcium score was in 47th %ile  Was told by cardiology that she may not need treatment   The 10-year ASCVD risk score (Arnett DK, et al., 2019) is: 14%   Values used to calculate the score:     Age: 77 years     Sex: Female     Is Non-Hispanic African American: No     Diabetic: No     Tobacco smoker: No     Systolic Blood Pressure: [id number] mmHg     Is BP treated: No     HDL Cholesterol: 60.4 mg/dL     Total Cholesterol: 242 mg/dL  Lab Results  Component Value Date   CREATININE 0.83 01/05/2023   BUN 12 01/05/2023   NA 139 01/05/2023   K 4.1 01/05/2023   CL 102 01/05/2023   CO2 29 01/05/2023   Lab Results  Component Value Date   WBC 7.2 01/05/2023   HGB 12.9 01/05/2023   HCT 38.8 01/05/2023   MCV 94.7 01/05/2023   PLT 306.0 01/05/2023   Lab Results  Component Value Date   ALT 12 01/05/2023   AST 13 01/05/2023   ALKPHOS 93 01/05/2023   BILITOT 0.9 01/05/2023   GERD - takes nexium for heartburn  Has appt coming up  Either takes pepcid or nexium but not both     Patient Active Problem List   Diagnosis Date Noted   Rash 01/12/2023   GERD (gastroesophageal reflux disease) 01/12/2023   Hyperlipidemia 01/01/2020   History of cold sores 01/01/2020   Loose stools 04/14/2019   External hemorrhoids 04/14/2019   Elevated random blood glucose level 04/14/2019   Screening mammogram, encounter for 12/26/2018   Fatty liver 12/26/2018   Colon cancer screening 12/03/2017   Estrogen deficiency 12/01/2016   B12 deficiency 12/01/2016   Routine general medical examination at a health care facility 11/20/2015   Encounter for Medicare annual wellness exam 08/21/2013   Encounter for screening mammogram for breast  cancer 12/22/2011   ALLERGIC RHINITIS 12/14/2008   Vitamin D deficiency 08/23/2008  Hypothyroid 07/26/2008   Osteopenia 07/23/2008   Past Medical History:  Diagnosis Date   Allergy    allergic rhinitis   B12 deficiency    Back pain    disc disease lumbar   Colon polyp    Dyspepsia    Fatty liver    GERD (gastroesophageal reflux disease)    Graves' disease    Hyperlipidemia    Hyperthyroidism    Grave's disease (tx with radioactive I)   Hypothyroidism    Osteopenia    Vitamin D deficiency    Yeast vaginitis    recurrent- keeps terazol on hand / otc does not work   Past Surgical History:  Procedure Laterality Date   BREAST SURGERY     breast reduction   CARPAL TUNNEL RELEASE     CATARACT EXTRACTION, BILATERAL     CESAREAN SECTION     x 2   chin implant     COLONOSCOPY     COLONOSCOPY WITH PROPOFOL N/A 04/25/2018   Procedure: COLONOSCOPY WITH PROPOFOL;  Surgeon: Manya Silvas, MD;  Location: [facility] ENDOSCOPY;  Service: Endoscopy;  Laterality: N/A;   ESOPHAGOGASTRODUODENOSCOPY (EGD) WITH PROPOFOL N/A 04/25/2018   Procedure: ESOPHAGOGASTRODUODENOSCOPY (EGD) WITH PROPOFOL;  Surgeon: Manya Silvas, MD;  Location: [facility] ENDOSCOPY;  Service: Endoscopy;  Laterality: N/A;   EYE SURGERY     x3 for strabismus   REDUCTION MAMMAPLASTY Bilateral 1983   Social History   Tobacco Use   Smoking status: Former    Types: Cigarettes    Quit date: 11/23/1981    Years since quitting: 41.1   Smokeless tobacco: Never  Vaping Use   Vaping Use: Never used  Substance Use Topics   Alcohol use: Not Currently    Comment: rare   Drug use: No   Family History  Problem Relation Age of Onset   Osteoporosis Mother    Heart disease Mother        mild heart problem   Arthritis Father        RA   Breast cancer Cousin    Allergies  Allergen Reactions   Boniva [Ibandronic Acid]     Leg cramping    Zetia [Ezetimibe]     Joint pain    Current Outpatient Medications on File Prior to  Visit  Medication Sig Dispense Refill   ascorbic acid (VITAMIN C) 100 MG tablet Take by mouth.     ascorbic acid (VITAMIN C) 500 MG tablet Take 500 mg by mouth 3 (three) times a week.     Cholecalciferol (VITAMIN D3) 5000 UNITS CAPS Take 1 capsule by mouth daily.     denosumab (PROLIA) 60 MG/ML SOSY injection Inject 60 mg into the skin every 6 (six) months.     esomeprazole (NEXIUM) 20 MG capsule Take 20 mg by mouth daily at 12 noon.     famotidine (PEPCID) 20 MG tablet Take 20 mg by mouth as needed for heartburn or indigestion.     fexofenadine-pseudoephedrine (ALLEGRA-D 12 HOUR) 60-120 MG 12 hr tablet Take 1 tablet by mouth 2 (two) times daily as needed. 60 tablet 11   Fluticasone Propionate (FLONASE NA) Place into the nose as needed.     hydrocortisone 1 % ointment APPLY EXTERNALLY TO HEMORRHOIDS ONCE DAILY AS NEEDED 28.35 g 1   levothyroxine (SYNTHROID) 100 MCG tablet Take 100 mcg by mouth daily.     Liver Extract (LIVER PO) Take 1 tablet by mouth every other day.      Magnesium Oxide  250 MG TABS Take 1 tablet by mouth daily as needed.     MISC NATURAL PRODUCTS IJ Inject as directed. Allergy Injection     Multiple Vitamin (MULTIVITAMIN) capsule Take 1 capsule by mouth daily.     Multiple Vitamins-Minerals (PRESERVISION AREDS 2) CAPS Take 2 Capfuls by mouth daily.     Omega-3 Fatty Acids (FISH OIL PO) Take 1,500 mg by mouth daily.     Probiotic Product (ALIGN PO) Take 1 capsule by mouth daily.     valACYclovir (VALTREX) 500 MG tablet START VALYCYCLOVIR 500MG TWICE A DAY 24 HOURS PRIOR TO PROCEDURE AND CONTINUE TWICE DAILY X 5 DAYS TOTAL. FOR OUTBREAKS, TAKE 4 BY MOUTH AT ONSET OF SYMPTOMS AND 4 PO 12 HOURS LATER. 30 tablet 2   No current facility-administered medications on file prior to visit.    Review of Systems  Constitutional:  Negative for activity change, appetite change, fatigue, fever and unexpected weight change.  HENT:  Negative for congestion, ear pain, rhinorrhea, sinus  pressure and sore throat.   Eyes:  Negative for pain, redness and visual disturbance.  Respiratory:  Negative for cough, shortness of breath and wheezing.   Cardiovascular:  Negative for chest pain and palpitations.  Gastrointestinal:  Negative for abdominal pain, blood in stool, constipation and diarrhea.  Endocrine: Negative for polydipsia and polyuria.  Genitourinary:  Negative for dysuria, frequency and urgency.  Musculoskeletal:  Positive for arthralgias. Negative for back pain and myalgias.  Skin:  Positive for rash. Negative for pallor.  Allergic/Immunologic: Negative for environmental allergies.  Neurological:  Negative for dizziness, syncope and headaches.  Hematological:  Negative for adenopathy. Does not bruise/bleed easily.  Psychiatric/Behavioral:  Negative for decreased concentration and dysphoric mood. The patient is not nervous/anxious.        Objective:   Physical Exam Constitutional:      General: She is not in acute distress.    Appearance: Normal appearance. She is well-developed and normal weight. She is not ill-appearing or diaphoretic.  HENT:     Head: Normocephalic and atraumatic.     Right Ear: Tympanic membrane, ear canal and external ear normal.     Left Ear: Tympanic membrane, ear canal and external ear normal.     Nose: Nose normal. No congestion.     Mouth/Throat:     Mouth: Mucous membranes are moist.     Pharynx: Oropharynx is clear. No posterior oropharyngeal erythema.  Eyes:     General: No scleral icterus.    Extraocular Movements: Extraocular movements intact.     Conjunctiva/sclera: Conjunctivae normal.     Pupils: Pupils are equal, round, and reactive to light.  Neck:     Thyroid: No thyromegaly.     Vascular: No carotid bruit or JVD.  Cardiovascular:     Rate and Rhythm: Normal rate and regular rhythm.     Pulses: Normal pulses.     Heart sounds: Normal heart sounds.     No gallop.  Pulmonary:     Effort: Pulmonary effort is normal. No  respiratory distress.     Breath sounds: Normal breath sounds. No wheezing.     Comments: Good air exch Chest:     Chest wall: No tenderness.  Abdominal:     General: Bowel sounds are normal. There is no distension or abdominal bruit.     Palpations: Abdomen is soft. There is no mass.     Tenderness: There is no abdominal tenderness.     Hernia: No hernia is  present.  Genitourinary:    Comments: Breast exam: No mass, nodules, thickening, tenderness, bulging, retraction, inflamation, nipple discharge or skin changes noted.  No axillary or clavicular LA.     Musculoskeletal:        General: No tenderness. Normal range of motion.     Cervical back: Normal range of motion and neck supple. No rigidity. No muscular tenderness.     Right lower leg: No edema.     Left lower leg: No edema.     Comments: No kyphosis   Lymphadenopathy:     Cervical: No cervical adenopathy.  Skin:    General: Skin is warm and dry.     Coloration: Skin is not pale.     Findings: No erythema or rash.     Comments: Solar lentigines diffusely   3 small healed papules vs dry vesicles on L post buttock Non tender No erythema  No warmth or swelling   Neurological:     Mental Status: She is alert. Mental status is at baseline.     Cranial Nerves: No cranial nerve deficit.     Motor: No abnormal muscle tone.     Coordination: Coordination normal.     Gait: Gait normal.     Deep Tendon Reflexes: Reflexes are normal and symmetric. Reflexes normal.  Psychiatric:        Mood and Affect: Mood normal.        Cognition and Memory: Cognition and memory normal.           Assessment & Plan:   Problem List Items Addressed This Visit       Digestive   Fatty liver    Nl liver labs this draw       GERD (gastroesophageal reflux disease)    Taking nexium or pepcid as needed GI appt is upcoming       Relevant Medications   Probiotic Product (ALIGN PO)   esomeprazole (NEXIUM) 20 MG capsule     Endocrine    Hypothyroid    Hypothyroidism  Pt has no clinical changes No change in energy level/ hair or skin/ edema and no tremor Lab Results  Component Value Date   TSH 3.27 01/05/2023    Plan continuue levothyroxine 100 mcg daily        Musculoskeletal and Integument   Osteopenia    Dexa ordered  No falls or fx  Taking prolia and doing well  On vit d and mvi  Enc wt bearing exercise when able        Rash    Several papules on L buttock  Not painful  Inst to keep clean with soap and water and monitor Update if not starting to improve in a week or if worsening          Other   B12 deficiency    Lab Results  Component Value Date   VITAMINB12 729 01/05/2023        Elevated random blood glucose level    Lab Results  Component Value Date   HGBA1C 5.8 01/05/2023  disc imp of low glycemic diet and wt loss to prevent DM2       Estrogen deficiency   Relevant Orders   DG Bone Density   Hyperlipidemia    Disc goals for lipids and reasons to control them Rev last labs with pt Rev low sat fat diet in detail LDL is up to 164 Intol fo statin and zetia Open to trial of intermittent statin -will  try crestor 5 mg three days weekly  Had card ca score of 47%ile  If not tolerating int statin consider cardiology f/u      Relevant Medications   rosuvastatin (CRESTOR) 5 MG tablet   Routine general medical examination at a health care facility - Primary    Reviewed health habits including diet and exercise and skin cancer prevention Reviewed appropriate screening tests for age  Also reviewed health mt list, fam hx and immunization status , as well as social and family history   See HPI Labs reviewed  Plans to get shingrix vaccine Declines AMW  Mammogram utd 09/2022 Colonoscopy utd 2019 Dexa ordered Nl PHQ       Vitamin D deficiency    Vitamin D level is therapeutic with current supplementation Disc importance of this to bone and overall health Level of 44

## 2023-01-12 NOTE — Assessment & Plan Note (Signed)
Lab Results  Component Value Date   HGBA1C 5.8 01/05/2023   disc imp of low glycemic diet and wt loss to prevent DM2

## 2023-01-12 NOTE — Assessment & Plan Note (Signed)
Taking nexium or pepcid as needed GI appt is upcoming

## 2023-01-12 NOTE — Assessment & Plan Note (Signed)
Nl liver labs this draw

## 2023-01-12 NOTE — Assessment & Plan Note (Signed)
Reviewed health habits including diet and exercise and skin cancer prevention Reviewed appropriate screening tests for age  Also reviewed health mt list, fam hx and immunization status , as well as social and family history   See HPI Labs reviewed  Plans to get shingrix vaccine Declines AMW  Mammogram utd 09/2022 Colonoscopy utd 2019 Dexa ordered Nl PHQ

## 2023-01-12 NOTE — Assessment & Plan Note (Signed)
Several papules on L buttock  Not painful  Inst to keep clean with soap and water and monitor Update if not starting to improve in a week or if worsening

## 2023-01-12 NOTE — Assessment & Plan Note (Signed)
Hypothyroidism  Pt has no clinical changes No change in energy level/ hair or skin/ edema and no tremor Lab Results  Component Value Date   TSH 3.27 01/05/2023    Plan continuue levothyroxine 100 mcg daily

## 2023-01-12 NOTE — Assessment & Plan Note (Signed)
Lab Results  Component Value Date   [id number] 01/05/2023

## 2023-01-12 NOTE — Assessment & Plan Note (Signed)
Disc goals for lipids and reasons to control them Rev last labs with pt Rev low sat fat diet in detail LDL is up to 164 Intol fo statin and zetia Open to trial of intermittent statin -will try crestor 5 mg three days weekly  Had card ca score of 47%ile  If not tolerating int statin consider cardiology f/u

## 2023-01-12 NOTE — Patient Instructions (Addendum)
If you are interested in the new shingles vaccine (Shingrix) - call your local pharmacy to check on coverage and availability  If affordable, get on a wait list at your pharmacy to get the vaccine.    Call [facility] to schedule your bone density test   Try the low dose crestor three days per week  If side effects stop it   Let's re check labs in about 8 weeks  If this does not work out I think a cardiology follow up is a good idea   Keep the rash clean with soap and water  If it does not continue to heal let us know   Take care of yourself

## 2023-01-12 NOTE — Assessment & Plan Note (Signed)
Dexa ordered  No falls or fx  Taking prolia and doing well  On vit d and mvi  Enc wt bearing exercise when able

## 2023-01-12 NOTE — Assessment & Plan Note (Signed)
Vitamin D level is therapeutic with current supplementation Disc importance of this to bone and overall health Level of 44

## 2023-01-19 DIAGNOSIS — M1611 Unilateral primary osteoarthritis, right hip: Secondary | ICD-10-CM | POA: Diagnosis not present

## 2023-01-19 DIAGNOSIS — M7061 Trochanteric bursitis, right hip: Secondary | ICD-10-CM | POA: Diagnosis not present

## 2023-01-20 ENCOUNTER — Ambulatory Visit: Payer: PPO | Admitting: Dermatology

## 2023-01-25 ENCOUNTER — Telehealth: Payer: Self-pay

## 2023-01-25 NOTE — Telephone Encounter (Signed)
Prolia VOB initiated via [email or web address] 

## 2023-01-25 NOTE — Telephone Encounter (Signed)
Please start new PA for Prolia for this patient.  Her next inj is due on or after 02/17/2023.  Thanks, Anda Kraft

## 2023-01-26 DIAGNOSIS — H903 Sensorineural hearing loss, bilateral: Secondary | ICD-10-CM | POA: Diagnosis not present

## 2023-01-26 DIAGNOSIS — H6123 Impacted cerumen, bilateral: Secondary | ICD-10-CM | POA: Diagnosis not present

## 2023-02-03 DIAGNOSIS — K760 Fatty (change of) liver, not elsewhere classified: Secondary | ICD-10-CM | POA: Diagnosis not present

## 2023-02-03 DIAGNOSIS — K219 Gastro-esophageal reflux disease without esophagitis: Secondary | ICD-10-CM | POA: Diagnosis not present

## 2023-02-03 DIAGNOSIS — K649 Unspecified hemorrhoids: Secondary | ICD-10-CM | POA: Diagnosis not present

## 2023-02-03 DIAGNOSIS — Z86010 Personal history of colonic polyps: Secondary | ICD-10-CM | POA: Diagnosis not present

## 2023-02-09 ENCOUNTER — Telehealth: Payer: Self-pay | Admitting: Family Medicine

## 2023-02-09 DIAGNOSIS — M1611 Unilateral primary osteoarthritis, right hip: Secondary | ICD-10-CM | POA: Diagnosis not present

## 2023-02-09 NOTE — Telephone Encounter (Signed)
Pt called checking status of Prolia, she said she is due for shot at the end of the month and hasn't heard anything regarding getting it, will route to PA dpt and also Joellen.

## 2023-02-09 NOTE — Telephone Encounter (Signed)
Form in your inbox, also sent separate message to PA dpt and Joellen to check status of Prolia inj authorization

## 2023-02-09 NOTE — Telephone Encounter (Signed)
Should be due for prolia  I am holding her form for appt on 3/22

## 2023-02-09 NOTE — Telephone Encounter (Signed)
Pt brought by a surgical clearance form for [facility] to comp. Told pt she needed a appt, scheduled ov for 02/12/23. Form is in [facility]'s folder. Pt also stated she received her last prolia inj on 08/18/22 & was instructed to return for another inj in 6 months. Pt states she hasn't heard anything from our office regarding shot. Pt is asking when can she come in for another inj? Call back # [phone]

## 2023-02-12 ENCOUNTER — Ambulatory Visit (INDEPENDENT_AMBULATORY_CARE_PROVIDER_SITE_OTHER): Payer: PPO | Admitting: Family Medicine

## 2023-02-12 ENCOUNTER — Other Ambulatory Visit (HOSPITAL_COMMUNITY): Payer: Self-pay

## 2023-02-12 ENCOUNTER — Encounter: Payer: Self-pay | Admitting: Family Medicine

## 2023-02-12 VITALS — BP 118/72 | HR 85 | Temp 97.8°F | Ht 63.75 in | Wt 148.5 lb

## 2023-02-12 DIAGNOSIS — R7309 Other abnormal glucose: Secondary | ICD-10-CM | POA: Diagnosis not present

## 2023-02-12 DIAGNOSIS — M8589 Other specified disorders of bone density and structure, multiple sites: Secondary | ICD-10-CM | POA: Diagnosis not present

## 2023-02-12 DIAGNOSIS — E890 Postprocedural hypothyroidism: Secondary | ICD-10-CM | POA: Diagnosis not present

## 2023-02-12 DIAGNOSIS — I700 Atherosclerosis of aorta: Secondary | ICD-10-CM

## 2023-02-12 DIAGNOSIS — Z01810 Encounter for preprocedural cardiovascular examination: Secondary | ICD-10-CM

## 2023-02-12 DIAGNOSIS — E782 Mixed hyperlipidemia: Secondary | ICD-10-CM

## 2023-02-12 DIAGNOSIS — R931 Abnormal findings on diagnostic imaging of heart and coronary circulation: Secondary | ICD-10-CM | POA: Diagnosis not present

## 2023-02-12 DIAGNOSIS — Z01818 Encounter for other preprocedural examination: Secondary | ICD-10-CM

## 2023-02-12 NOTE — Progress Notes (Unsigned)
Subjective:    Patient ID: Angela Bell, female    DOB: August 17, 1946, 77 y.o.   MRN: [id number]  HPI Pt presents for surgical clearance visit   OP-needs labs for prolia    Wt Readings from Last 3 Encounters:  02/12/23 148 lb 8 oz (67.4 kg)  01/12/23 142 lb 8 oz (64.6 kg)  12/09/22 145 lb (65.8 kg)   25.69 kg/m  Vitals:   02/12/23 1422  BP: 118/72  Pulse: 85  Temp: 97.8 F (36.6 C)  SpO2: 99%    Planning R TKA She had one shot - lasted a few weeks and then hobbling   [facility]  No date of surgery yet- may be in late April  Sister will be here and she will go home after surgery  Spinal anesthesia is planned   H/o surg  H/o anesthesia -no problems  No true drug allergies   No past cardiac problems  Does have hyperlipidemia and takes atorvastatin 5 mg three days per week  Cardiac calcium score of 25.5 (low) putting her in the 47%ile for age  Some aortic atherosclerosis noted on that CT Has seen cardiology/ Dr Rockey Situ for hyperlipidemia   No angina  No acute changes on EKG today   Lab Results  Component Value Date   CHOL 242 (H) 01/05/2023   HDL 60.40 01/05/2023   LDLCALC 164 (H) 01/05/2023   LDLDIRECT 146.3 08/22/2013   TRIG 90.0 01/05/2023   CHOLHDL 4 01/05/2023      No blood thinners No aspirin  Taking celebrex now-will have to hold for 10 d     Blood sugar Lab Results  Component Value Date   HGBA1C 5.8 01/05/2023  Last time 5.6   Watching diet   No past pulmonary problems  Non smoker  Quit in 1983  - smoked for about 8 years   Exercise tolerance :  No problems - can walk for 45 minutes no problems  Stairs =no problems (except hip)    Osteopenia Taking prolia  Due for shot and needs lab today for bmet   Last vitamin D Lab Results  Component Value Date   VD25OH 44.05 01/05/2023     Hypothyroidism  Pt has no clinical changes No change in energy level/ hair or skin/ edema and no tremor Lab Results  Component Value  Date   TSH 3.27 01/05/2023     Patient Active Problem List   Diagnosis Date Noted   Pre-operative clearance 02/12/2023   Aortic atherosclerosis ([place]) 02/12/2023   Agatston coronary artery calcium score less than 100 02/12/2023   Rash 01/12/2023   GERD (gastroesophageal reflux disease) 01/12/2023   Hyperlipidemia 01/01/2020   History of cold sores 01/01/2020   Loose stools 04/14/2019   External hemorrhoids 04/14/2019   Elevated random blood glucose level 04/14/2019   Screening mammogram, encounter for 12/26/2018   Fatty liver 12/26/2018   Colon cancer screening 12/03/2017   Estrogen deficiency 12/01/2016   B12 deficiency 12/01/2016   Routine general medical examination at a health care facility 11/20/2015   Encounter for Medicare annual wellness exam 08/21/2013   Encounter for screening mammogram for breast cancer 12/22/2011   ALLERGIC RHINITIS 12/14/2008   Vitamin D deficiency 08/23/2008   Hypothyroid 07/26/2008   Osteopenia 07/23/2008   Past Medical History:  Diagnosis Date   Allergy    allergic rhinitis   B12 deficiency    Back pain    disc disease lumbar   Colon polyp  Dyspepsia    Fatty liver    GERD (gastroesophageal reflux disease)    Graves' disease    Hyperlipidemia    Hyperthyroidism    Grave's disease (tx with radioactive I)   Hypothyroidism    Osteopenia    Vitamin D deficiency    Yeast vaginitis    recurrent- keeps terazol on hand / otc does not work   Past Surgical History:  Procedure Laterality Date   BREAST SURGERY     breast reduction   CARPAL TUNNEL RELEASE     CATARACT EXTRACTION, BILATERAL     CESAREAN SECTION     x 2   chin implant     COLONOSCOPY     COLONOSCOPY WITH PROPOFOL N/A 04/25/2018   Procedure: COLONOSCOPY WITH PROPOFOL;  Surgeon: Manya Silvas, MD;  Location[facility] ENDOSCOPY;  Service: Endoscopy;  Laterality: N/A;   ESOPHAGOGASTRODUODENOSCOPY (EGD) WITH PROPOFOL N/A 04/25/2018   Procedure: ESOPHAGOGASTRODUODENOSCOPY (EGD)  WITH PROPOFOL;  Surgeon: Manya Silvas, MD;  Location: [facility] ENDOSCOPY;  Service: Endoscopy;  Laterality: N/A;   EYE SURGERY     x3 for strabismus   REDUCTION MAMMAPLASTY Bilateral 1983   Social History   Tobacco Use   Smoking status: Former    Types: Cigarettes    Quit date: 11/23/1981    Years since quitting: 41.2   Smokeless tobacco: Never  Vaping Use   Vaping Use: Never used  Substance Use Topics   Alcohol use: Not Currently    Comment: rare   Drug use: No   Family History  Problem Relation Age of Onset   Osteoporosis Mother    Heart disease Mother        mild heart problem   Arthritis Father        RA   Breast cancer Cousin    Allergies  Allergen Reactions   Boniva [Ibandronic Acid]     Leg cramping    Zetia [Ezetimibe]     Joint pain    Current Outpatient Medications on File Prior to Visit  Medication Sig Dispense Refill   ascorbic acid (VITAMIN C) 500 MG tablet Take 500 mg by mouth 3 (three) times a week.     atorvastatin (LIPITOR) 10 MG tablet Take 1/2 pill three days weekly 12 tablet 11   Cholecalciferol (VITAMIN D3) 5000 UNITS CAPS Take 1 capsule by mouth daily.     denosumab (PROLIA) 60 MG/ML SOSY injection Inject 60 mg into the skin every 6 (six) months.     esomeprazole (NEXIUM) 20 MG capsule Take 20 mg by mouth daily at 12 noon.     famotidine (PEPCID) 20 MG tablet Take 20 mg by mouth as needed for heartburn or indigestion.     fexofenadine-pseudoephedrine (ALLEGRA-D 12 HOUR) 60-120 MG 12 hr tablet Take 1 tablet by mouth 2 (two) times daily as needed. 60 tablet 11   Fluticasone Propionate (FLONASE NA) Place into the nose as needed.     hydrocortisone 1 % ointment APPLY EXTERNALLY TO HEMORRHOIDS ONCE DAILY AS NEEDED 28.35 g 1   levothyroxine (SYNTHROID) 100 MCG tablet Take 100 mcg by mouth daily.     Liver Extract (LIVER PO) Take 1 tablet by mouth every other day.      Magnesium Oxide 250 MG TABS Take 1 tablet by mouth daily as needed.     MISC NATURAL  PRODUCTS IJ Inject as directed. Allergy Injection     Multiple Vitamin (MULTIVITAMIN) capsule Take 1 capsule by mouth daily.  Multiple Vitamins-Minerals (PRESERVISION AREDS 2) CAPS Take 2 Capfuls by mouth daily.     Omega-3 Fatty Acids (FISH OIL PO) Take 1,500 mg by mouth daily.     Probiotic Product (ALIGN PO) Take 1 capsule by mouth daily.     valACYclovir (VALTREX) 500 MG tablet START VALYCYCLOVIR 500MG  TWICE A DAY 24 HOURS PRIOR TO PROCEDURE AND CONTINUE TWICE DAILY X 5 DAYS TOTAL. FOR OUTBREAKS, TAKE 4 BY MOUTH AT ONSET OF SYMPTOMS AND 4 PO 12 HOURS LATER. 30 tablet 2   No current facility-administered medications on file prior to visit.    Review of Systems  Constitutional:  Negative for activity change, appetite change, fatigue, fever and unexpected weight change.  HENT:  Negative for congestion, ear pain, rhinorrhea, sinus pressure and sore throat.   Eyes:  Negative for pain, redness and visual disturbance.  Respiratory:  Negative for cough, shortness of breath and wheezing.   Cardiovascular:  Negative for chest pain and palpitations.  Gastrointestinal:  Negative for abdominal pain, blood in stool, constipation and diarrhea.  Endocrine: Negative for polydipsia and polyuria.  Genitourinary:  Negative for dysuria, frequency and urgency.  Musculoskeletal:  Positive for arthralgias. Negative for back pain and myalgias.  Skin:  Negative for pallor and rash.  Allergic/Immunologic: Negative for environmental allergies.  Neurological:  Negative for dizziness, syncope and headaches.  Hematological:  Negative for adenopathy. Does not bruise/bleed easily.  Psychiatric/Behavioral:  Negative for decreased concentration and dysphoric mood. The patient is not nervous/anxious.        Objective:   Physical Exam Constitutional:      General: She is not in acute distress.    Appearance: Normal appearance. She is well-developed and normal weight. She is not ill-appearing or diaphoretic.  HENT:      Head: Normocephalic and atraumatic.     Mouth/Throat:     Mouth: Mucous membranes are moist.  Eyes:     General: No scleral icterus.    Conjunctiva/sclera: Conjunctivae normal.     Pupils: Pupils are equal, round, and reactive to light.  Neck:     Thyroid: No thyromegaly.     Vascular: No carotid bruit or JVD.  Cardiovascular:     Rate and Rhythm: Normal rate and regular rhythm.     Heart sounds: Normal heart sounds.     No gallop.  Pulmonary:     Effort: Pulmonary effort is normal. No respiratory distress.     Breath sounds: Normal breath sounds. No stridor. No wheezing, rhonchi or rales.  Abdominal:     General: There is no distension or abdominal bruit.     Palpations: Abdomen is soft. There is no mass.     Tenderness: There is no abdominal tenderness.  Musculoskeletal:     Cervical back: Normal range of motion and neck supple.     Right lower leg: No edema.     Left lower leg: No edema.  Lymphadenopathy:     Cervical: No cervical adenopathy.  Skin:    General: Skin is warm and dry.     Coloration: Skin is not pale.     Findings: No bruising or rash.  Neurological:     Mental Status: She is alert.     Coordination: Coordination normal.     Deep Tendon Reflexes: Reflexes are normal and symmetric. Reflexes normal.  Psychiatric:        Mood and Affect: Mood normal.           Assessment & Plan:  Problem List Items Addressed This Visit       Cardiovascular and Mediastinum   Aortic atherosclerosis ([place])    Incidental noted on CT for ca score  Pt takes stain for cholesterol No symptoms  Nl bp          Musculoskeletal and Integument   Osteopenia    Taking prolia Due for labs today for her upcoming shot Tolerating well       Relevant Orders   Basic Metabolic Panel (Completed)     Other   Agatston coronary artery calcium score less than 100    Score of 25.5 in may 2023 She discussed with cardiology  No angina or symptoms  Working on  cholesterol with low dose intermittent statin Good bp      Elevated random blood glucose level    Lab Results  Component Value Date   HGBA1C 5.8 01/05/2023  disc imp of low glycemic diet and wt loss to prevent DM2       Hyperlipidemia    Disc goals for lipids and reasons to control them Rev last labs with pt Rev low sat fat diet in detail Takes atorvastatin 3 days per week -this is the most she tolerates Last LDL of 164 Ratio of 4      Pre-operative clearance - Primary    For R TKA Low risk No past problems with aneshesia or surgery  No true drig allergies  Reviewed cardiac hx with low risk ca score/ treating cholesterol, rev last cardiology note and her CT  No significant changes on today's EKG No blood thinners Will have to hold celebrex for 10 d pre surgery  Good exercise tolerance Non smoker / smoked for 8 y prior to 63s and has no h/o copd Other chronic medical problems are controlled        Other Visit Diagnoses     Pre-operative cardiovascular examination       Relevant Orders   EKG 12-Lead (Completed)

## 2023-02-12 NOTE — Assessment & Plan Note (Signed)
Incidental noted on CT for ca score  Pt takes stain for cholesterol No symptoms  Nl bp

## 2023-02-12 NOTE — Patient Instructions (Addendum)
Labs today for prolia so we can plan your shot   To prevent diabetes Try to get most of your carbohydrates from produce (with the exception of white potatoes)  Eat less bread/pasta/rice/snack foods/cereals/sweets and other items from the middle of the grocery store (processed carbs)  Take care of yourself   No restrictions for surgery

## 2023-02-12 NOTE — Telephone Encounter (Signed)
Pt ready for scheduling for Prolia 60mg  on or after : 02/12/23  Out-of-pocket cost due at time of visit: $322  Primary: [employer] Prolia co-insurance: 20% Admin fee co-insurance: $20  Secondary: --- [place] co-insurance:  Admin fee co-insurance:   Medical Benefit Details: Date Benefits were checked: 01/26/23 Deductible: NO/ Coinsurance: 20%/ Admin Fee: $20  Prior Auth: N/A PA# Expiration Date:    Pharmacy benefit: Copay $200 If patient wants fill through the pharmacy benefit please send prescription to:  [facility] , and include estimated need by date in rx notes. Pharmacy will ship medication directly to the office.  Patient NOT eligible for Prolia Copay Card. Copay Card can make patient's cost as little as $25. Link to apply: [email or web address]://www.amgensupportplus.[email or web address]/copay  ** This summary of benefits is an estimation of the patient's out-of-pocket cost. Exact cost may very based on individual plan coverage.

## 2023-02-12 NOTE — Assessment & Plan Note (Signed)
Score of 25.5 in may 2023 She discussed with cardiology  No angina or symptoms  Working on cholesterol with low dose intermittent statin Good bp

## 2023-02-12 NOTE — Telephone Encounter (Signed)
Called [employer] to get benefits  20% coinsurance, $20 copay, No deductible, No PA  Ref #: [id number] Miquel Dunn.

## 2023-02-13 LAB — BASIC METABOLIC PANEL
BUN: 15 mg/dL (ref 7–25)
CO2: 23 mmol/L (ref 20–32)
Calcium: 9.2 mg/dL (ref 8.6–10.4)
Chloride: 105 mmol/L (ref 98–110)
Creat: 0.88 mg/dL (ref 0.60–1.00)
Glucose, Bld: 84 mg/dL (ref 65–99)
Potassium: 4 mmol/L (ref 3.5–5.3)
Sodium: 140 mmol/L (ref 135–146)

## 2023-02-14 NOTE — Assessment & Plan Note (Signed)
Lab Results  Component Value Date   HGBA1C 5.8 01/05/2023   disc imp of low glycemic diet and wt loss to prevent DM2  

## 2023-02-14 NOTE — Assessment & Plan Note (Addendum)
For R TKA Low risk No past problems with aneshesia or surgery  No true drig allergies  Reviewed cardiac hx with low risk ca score/ treating cholesterol, rev last cardiology note and her CT  No significant changes on today's EKG No blood thinners Will have to hold celebrex for 10 d pre surgery  Good exercise tolerance Non smoker / smoked for 8 y prior to 35s and has no h/o copd Other chronic medical problems are controlled

## 2023-02-14 NOTE — Assessment & Plan Note (Signed)
Disc goals for lipids and reasons to control them Rev last labs with pt Rev low sat fat diet in detail Takes atorvastatin 3 days per week -this is the most she tolerates Last LDL of 164 Ratio of 4

## 2023-02-14 NOTE — Assessment & Plan Note (Signed)
Taking prolia Due for labs today for her upcoming shot Tolerating well

## 2023-02-15 ENCOUNTER — Other Ambulatory Visit (HOSPITAL_COMMUNITY): Payer: Self-pay

## 2023-02-15 ENCOUNTER — Other Ambulatory Visit: Payer: Self-pay

## 2023-02-15 MED ORDER — DENOSUMAB 60 MG/ML ~~LOC~~ SOSY
60.0000 mg | PREFILLED_SYRINGE | Freq: Once | SUBCUTANEOUS | 0 refills | Status: DC
  Filled 2023-02-15: qty 1, 1d supply, fill #0

## 2023-02-15 NOTE — Telephone Encounter (Signed)
Spoke to patient in office set up prolia injection. No further action needed at this time.

## 2023-02-16 ENCOUNTER — Other Ambulatory Visit (HOSPITAL_COMMUNITY): Payer: Self-pay

## 2023-02-17 ENCOUNTER — Other Ambulatory Visit (HOSPITAL_COMMUNITY): Payer: Self-pay

## 2023-02-18 ENCOUNTER — Ambulatory Visit: Payer: PPO

## 2023-02-18 ENCOUNTER — Other Ambulatory Visit (HOSPITAL_COMMUNITY): Payer: Self-pay

## 2023-02-18 DIAGNOSIS — M8589 Other specified disorders of bone density and structure, multiple sites: Secondary | ICD-10-CM | POA: Diagnosis not present

## 2023-02-18 DIAGNOSIS — E890 Postprocedural hypothyroidism: Secondary | ICD-10-CM | POA: Diagnosis not present

## 2023-02-18 MED ORDER — DENOSUMAB 60 MG/ML ~~LOC~~ SOSY
60.0000 mg | PREFILLED_SYRINGE | Freq: Once | SUBCUTANEOUS | Status: AC
  Administered 2023-02-18: 60 mg via SUBCUTANEOUS

## 2023-02-18 NOTE — Progress Notes (Signed)
Per orders of Allie Bossier, DPN AGNP-C, injection of Prolia  given by Francella Solian in left SQ.  Patient tolerated injection well. We will contact patient when due for next injection.

## 2023-02-19 ENCOUNTER — Other Ambulatory Visit (HOSPITAL_COMMUNITY): Payer: Self-pay

## 2023-02-23 ENCOUNTER — Encounter: Payer: Self-pay | Admitting: Dermatology

## 2023-02-25 NOTE — Telephone Encounter (Signed)
Labs and prolia scheduled and done

## 2023-03-08 ENCOUNTER — Telehealth: Payer: Self-pay | Admitting: Family Medicine

## 2023-03-08 DIAGNOSIS — M8589 Other specified disorders of bone density and structure, multiple sites: Secondary | ICD-10-CM

## 2023-03-08 DIAGNOSIS — Z01818 Encounter for other preprocedural examination: Secondary | ICD-10-CM | POA: Diagnosis not present

## 2023-03-08 DIAGNOSIS — M1611 Unilateral primary osteoarthritis, right hip: Secondary | ICD-10-CM | POA: Diagnosis not present

## 2023-03-08 NOTE — Telephone Encounter (Signed)
-----   Message from Alvina Chou sent at 02/22/2023 10:12 AM EDT ----- Regarding: Lab orders for Tuesday, 4.16.24 Lab orders, thanks

## 2023-03-09 ENCOUNTER — Encounter: Payer: Self-pay | Admitting: Family Medicine

## 2023-03-09 ENCOUNTER — Other Ambulatory Visit (INDEPENDENT_AMBULATORY_CARE_PROVIDER_SITE_OTHER): Payer: PPO

## 2023-03-09 DIAGNOSIS — M8589 Other specified disorders of bone density and structure, multiple sites: Secondary | ICD-10-CM | POA: Diagnosis not present

## 2023-03-09 DIAGNOSIS — E782 Mixed hyperlipidemia: Secondary | ICD-10-CM

## 2023-03-09 LAB — BASIC METABOLIC PANEL
BUN: 11 mg/dL (ref 6–23)
CO2: 26 mEq/L (ref 19–32)
Calcium: 9 mg/dL (ref 8.4–10.5)
Chloride: 103 mEq/L (ref 96–112)
Creatinine, Ser: 0.72 mg/dL (ref 0.40–1.20)
GFR: 82.29 mL/min (ref >60.00)
Glucose, Bld: 87 mg/dL (ref 70–99)
Potassium: 4.2 mEq/L (ref 3.5–5.1)
Sodium: 137 mEq/L (ref 135–145)

## 2023-03-09 NOTE — Telephone Encounter (Signed)
Sorry, I thought this was just for prolia labs and did not order cholesterol We can do that at her convenience How much lipitor is she tolerating now?  (So I can refill )

## 2023-03-19 DIAGNOSIS — M1611 Unilateral primary osteoarthritis, right hip: Secondary | ICD-10-CM | POA: Diagnosis not present

## 2023-03-24 ENCOUNTER — Ambulatory Visit: Payer: PPO | Admitting: Dermatology

## 2023-04-06 DIAGNOSIS — Z96641 Presence of right artificial hip joint: Secondary | ICD-10-CM | POA: Diagnosis not present

## 2023-04-06 DIAGNOSIS — Z471 Aftercare following joint replacement surgery: Secondary | ICD-10-CM | POA: Diagnosis not present

## 2023-04-20 ENCOUNTER — Encounter: Payer: Self-pay | Admitting: Family Medicine

## 2023-04-20 ENCOUNTER — Telehealth: Payer: Self-pay | Admitting: *Deleted

## 2023-04-20 DIAGNOSIS — R5382 Chronic fatigue, unspecified: Secondary | ICD-10-CM

## 2023-04-20 DIAGNOSIS — R5383 Other fatigue: Secondary | ICD-10-CM | POA: Insufficient documentation

## 2023-04-20 DIAGNOSIS — E538 Deficiency of other specified B group vitamins: Secondary | ICD-10-CM

## 2023-04-20 NOTE — Telephone Encounter (Signed)
I put the orders in 

## 2023-04-20 NOTE — Telephone Encounter (Signed)
Pt sent a message saying:    I had hip replacement surgery 03/19/23, 5 weeks on Friday.  I am so week.  I have an order in to have my cholesterol check.  Could you add hemoglobin, B12 and any other iron deficiency.  My Orthopedic nurse said OK but get you to do the blood work.

## 2023-04-21 NOTE — Telephone Encounter (Signed)
Sent mychart letting pt know orders are in

## 2023-04-26 ENCOUNTER — Encounter: Payer: Self-pay | Admitting: Dermatology

## 2023-04-30 ENCOUNTER — Other Ambulatory Visit (INDEPENDENT_AMBULATORY_CARE_PROVIDER_SITE_OTHER): Payer: PPO

## 2023-04-30 DIAGNOSIS — E538 Deficiency of other specified B group vitamins: Secondary | ICD-10-CM | POA: Diagnosis not present

## 2023-04-30 DIAGNOSIS — R5382 Chronic fatigue, unspecified: Secondary | ICD-10-CM | POA: Diagnosis not present

## 2023-04-30 DIAGNOSIS — E782 Mixed hyperlipidemia: Secondary | ICD-10-CM

## 2023-04-30 LAB — LIPID PANEL
Cholesterol: 167 mg/dL (ref 0–200)
HDL: 57.9 mg/dL (ref >39.00)
LDL Cholesterol: 88 mg/dL (ref 0–99)
NonHDL: 109.24
Total CHOL/HDL Ratio: 3
Triglycerides: 107 mg/dL (ref 0.0–149.0)
VLDL: 21.4 mg/dL (ref 0.0–40.0)

## 2023-04-30 LAB — ALT: ALT: 12 U/L (ref 0–35)

## 2023-04-30 LAB — CBC WITH DIFFERENTIAL/PLATELET
Basophils Absolute: 0 10*3/uL (ref 0.0–0.1)
Basophils Relative: 0.8 % (ref 0.0–3.0)
Eosinophils Absolute: 0.1 10*3/uL (ref 0.0–0.7)
Eosinophils Relative: 2 % (ref 0.0–5.0)
HCT: 35.7 % — ABNORMAL LOW (ref 36.0–46.0)
Hemoglobin: 11.5 g/dL — ABNORMAL LOW (ref 12.0–15.0)
Lymphocytes Relative: 16.5 % (ref 12.0–46.0)
Lymphs Abs: 1 10*3/uL (ref 0.7–4.0)
MCHC: 32.1 g/dL (ref 30.0–36.0)
MCV: 94.9 fl (ref 78.0–100.0)
Monocytes Absolute: 0.4 10*3/uL (ref 0.1–1.0)
Monocytes Relative: 6.1 % (ref 3.0–12.0)
Neutro Abs: 4.4 10*3/uL (ref 1.4–7.7)
Neutrophils Relative %: 74.6 % (ref 43.0–77.0)
Platelets: 333 10*3/uL (ref 150.0–400.0)
RBC: 3.76 Mil/uL — ABNORMAL LOW (ref 3.87–5.11)
RDW: 14.2 % (ref 11.5–15.5)
WBC: 5.9 10*3/uL (ref 4.0–10.5)

## 2023-04-30 LAB — AST: AST: 17 U/L (ref 0–37)

## 2023-04-30 LAB — VITAMIN B12: Vitamin B-12: 962 pg/mL — ABNORMAL HIGH (ref 211–911)

## 2023-04-30 LAB — IRON: Iron: 47 ug/dL (ref 42–145)

## 2023-05-03 ENCOUNTER — Encounter: Payer: Self-pay | Admitting: Family Medicine

## 2023-05-04 DIAGNOSIS — Z471 Aftercare following joint replacement surgery: Secondary | ICD-10-CM | POA: Diagnosis not present

## 2023-05-04 DIAGNOSIS — Z96641 Presence of right artificial hip joint: Secondary | ICD-10-CM | POA: Diagnosis not present

## 2023-05-05 MED ORDER — ATORVASTATIN CALCIUM 10 MG PO TABS: 10.0000 mg | ORAL_TABLET | ORAL | 3 refills | Status: DC

## 2023-05-05 NOTE — Addendum Note (Signed)
Addended by: Roxy Manns A on: 05/05/2023 09:49 PM   Modules accepted: Orders

## 2023-06-28 ENCOUNTER — Encounter: Payer: Self-pay | Admitting: Family Medicine

## 2023-06-28 NOTE — Telephone Encounter (Signed)
Please schedule her for in office visit  Not sure what it going on or if the vaccine reaction is related Thanks

## 2023-06-28 NOTE — Telephone Encounter (Signed)
She is talking about side eff from a shingles shot she received on 06/18/23 (updated in chart already)

## 2023-06-29 NOTE — Telephone Encounter (Signed)
Spoke to pt, scheduled pt for 07/01/23

## 2023-06-30 ENCOUNTER — Ambulatory Visit (INDEPENDENT_AMBULATORY_CARE_PROVIDER_SITE_OTHER): Payer: Self-pay | Admitting: Dermatology

## 2023-06-30 ENCOUNTER — Ambulatory Visit: Payer: PPO | Admitting: Dermatology

## 2023-06-30 DIAGNOSIS — B009 Herpesviral infection, unspecified: Secondary | ICD-10-CM

## 2023-06-30 DIAGNOSIS — L988 Other specified disorders of the skin and subcutaneous tissue: Secondary | ICD-10-CM

## 2023-06-30 MED ORDER — VALACYCLOVIR HCL 500 MG PO TABS: ORAL_TABLET | ORAL | 11 refills | Status: DC

## 2023-06-30 NOTE — Progress Notes (Signed)
Follow-Up Visit   Subjective  Angela Bell is a 77 y.o. female who presents for the following: filler for facial elastosis  Would like to discuss botox and filler for lower face  The following portions of the chart were reviewed this encounter and updated as appropriate: medications, allergies, medical history  Review of Systems:  No other skin or systemic complaints except as noted in HPI or Assessment and Plan.  Objective  Well appearing patient in no apparent distress; mood and affect are within normal limits.  A focused examination was performed of the face. Relevant physical exam findings are noted in the Assessment and Plan or shown in photos.                                        After photos              Injection map photo   face Rhytides and volume loss.    Assessment & Plan   Elastosis of skin face  A total of 40 units of botox injected today Crows' feet 10 x 2   20 units  Frown complex 20 units      Botox Injection - face Location: See attached image  Informed consent: Discussed risks (infection, pain, bleeding, bruising, swelling, allergic reaction, paralysis of nearby muscles, eyelid droop, double vision, neck weakness, difficulty breathing, headache, undesirable cosmetic result, and need for additional treatment) and benefits of the procedure, as well as the alternatives.  Informed consent was obtained.  Preparation: The area was cleansed with alcohol.  Procedure Details:  Botox was injected into the dermis with a 30-gauge needle. Pressure applied to any bleeding. Ice packs offered for swelling.  Lot Number:   [id number] Expiration:  04/2025  Total Units Injected:  40  Plan: Tylenol may be used for headache.  Allow 2 weeks before returning to clinic for additional dosing as needed. Patient will call for any problems.   Herpes simplex  Related Medications valACYclovir (VALTREX) 500 MG  tablet START VALYCYCLOVIR 500MG  TWICE A DAY 24 HOURS PRIOR TO PROCEDURE AND CONTINUE TWICE DAILY X 5 DAYS TOTAL. FOR OUTBREAKS, TAKE 4 BY MOUTH AT ONSET OF SYMPTOMS AND 4 PO 12 HOURS LATER.   Facial Elastosis  Prior to the procedure, the patient's past medical history, allergies and the rare but potential risks and complications were reviewed with the patient and a signed consent was obtained. Pre and post-treatment care was discussed and instructions provided.   Location: marionette lines  Filler Type: Juvederm Voluma 1 cc lot [id number] exp 01/06/2024  Procedure: The area was prepped thoroughly with Puracyn. After introducing the needle into the desired treatment area, the syringe plunger was drawn back to ensure there was no flash of blood prior to injecting the filler in order to minimize risk of intravascular injection and vascular occlusion. After injection of the filler, the treated areas were cleansed and iced to reduce swelling. Post-treatment instructions were reviewed with the patient.       Patient tolerated the procedure well. The patient will call with any problems, questions or concerns prior to their next appointment.  Discussed may not get desired results with the deep oral commissure / marionette lines especially if she ignores filling mid face, but pt wants to just do oral commissures as she had mid face fillers in past and did not like it.  Return for  4 month botox and filler .  IAsher Muir, CMA, am acting as scribe for Armida Sans, MD. Documentation: I have reviewed the above documentation for accuracy and completeness, and I agree with the above.  Armida Sans, MD

## 2023-06-30 NOTE — Patient Instructions (Signed)

## 2023-07-01 ENCOUNTER — Ambulatory Visit: Payer: PPO | Admitting: Family Medicine

## 2023-07-06 ENCOUNTER — Ambulatory Visit (INDEPENDENT_AMBULATORY_CARE_PROVIDER_SITE_OTHER): Payer: PPO | Admitting: Family Medicine

## 2023-07-06 ENCOUNTER — Encounter: Payer: Self-pay | Admitting: Family Medicine

## 2023-07-06 VITALS — BP 116/68 | HR 74 | Temp 97.8°F | Ht 63.75 in | Wt 146.1 lb

## 2023-07-06 DIAGNOSIS — M8589 Other specified disorders of bone density and structure, multiple sites: Secondary | ICD-10-CM | POA: Diagnosis not present

## 2023-07-06 DIAGNOSIS — E890 Postprocedural hypothyroidism: Secondary | ICD-10-CM

## 2023-07-06 DIAGNOSIS — E782 Mixed hyperlipidemia: Secondary | ICD-10-CM

## 2023-07-06 DIAGNOSIS — M791 Myalgia, unspecified site: Secondary | ICD-10-CM | POA: Insufficient documentation

## 2023-07-06 DIAGNOSIS — R600 Localized edema: Secondary | ICD-10-CM | POA: Insufficient documentation

## 2023-07-06 LAB — CBC WITH DIFFERENTIAL/PLATELET
Basophils Absolute: 0.1 10*3/uL (ref 0.0–0.1)
Basophils Relative: 0.9 % (ref 0.0–3.0)
Eosinophils Absolute: 0.1 10*3/uL (ref 0.0–0.7)
Eosinophils Relative: 2.5 % (ref 0.0–5.0)
HCT: 39 % (ref 36.0–46.0)
Hemoglobin: 12.5 g/dL (ref 12.0–15.0)
Lymphocytes Relative: 27 % (ref 12.0–46.0)
Lymphs Abs: 1.6 10*3/uL (ref 0.7–4.0)
MCHC: 32.1 g/dL (ref 30.0–36.0)
MCV: 93.9 fl (ref 78.0–100.0)
Monocytes Absolute: 0.5 10*3/uL (ref 0.1–1.0)
Monocytes Relative: 9.2 % (ref 3.0–12.0)
Neutro Abs: 3.6 10*3/uL (ref 1.4–7.7)
Neutrophils Relative %: 60.4 % (ref 43.0–77.0)
Platelets: 261 10*3/uL (ref 150.0–400.0)
RBC: 4.15 Mil/uL (ref 3.87–5.11)
RDW: 13.9 % (ref 11.5–15.5)
WBC: 5.9 10*3/uL (ref 4.0–10.5)

## 2023-07-06 LAB — BASIC METABOLIC PANEL
BUN: 12 mg/dL (ref 6–23)
CO2: 26 mEq/L (ref 19–32)
Calcium: 9.4 mg/dL (ref 8.4–10.5)
Chloride: 102 mEq/L (ref 96–112)
Creatinine, Ser: 0.71 mg/dL (ref 0.40–1.20)
GFR: 83.49 mL/min (ref >60.00)
Glucose, Bld: 92 mg/dL (ref 70–99)
Potassium: 3.8 mEq/L (ref 3.5–5.1)
Sodium: 136 mEq/L (ref 135–145)

## 2023-07-06 LAB — HEPATIC FUNCTION PANEL
ALT: 11 U/L (ref 0–35)
AST: 16 U/L (ref 0–37)
Albumin: 4.1 g/dL (ref 3.5–5.2)
Alkaline Phosphatase: 89 U/L (ref 39–117)
Bilirubin, Direct: 0.1 mg/dL (ref 0.0–0.3)
Total Bilirubin: 0.5 mg/dL (ref 0.2–1.2)
Total Protein: 7 g/dL (ref 6.0–8.3)

## 2023-07-06 LAB — TSH: TSH: 0.32 u[IU]/mL — ABNORMAL LOW (ref 0.35–5.50)

## 2023-07-06 LAB — CK: Total CK: 42 U/L (ref 7–177)

## 2023-07-06 NOTE — Progress Notes (Signed)
Subjective:    Patient ID: Angela Bell, female    DOB: November 29, 1945, 77 y.o.   MRN: [id number]  HPI  Wt Readings from Last 3 Encounters:  07/06/23 146 lb 2 oz (66.3 kg)  02/12/23 148 lb 8 oz (67.4 kg)  01/12/23 142 lb 8 oz (64.6 kg)   25.28 kg/m  Vitals:   07/06/23 1126  BP: 116/68  Pulse: 74  Temp: 97.8 F (36.6 C)  SpO2: 95%   Pt presents for ? Side effect from medicine or immunization   Had hip replacement on 4/26  Had done well on lipitor for a while - up to three days per week   Had this singrix vaccine 7/26 Arm was sore and some joints were sore (expected that) a little tired  Took advil the next day   On 5th day after it had swelling of feet (uncomfortable)   Held lipitor in case that was causing it  Swelling went down a lot  Worse on right   Had to take amox for dental cleaning yesterday  Thinks it helped   Does not tend to have varicose veins   Women in family had ? Lymphedema   Is drinking lots of water  Avoids sodium as a rule / seldom processed foods   Works full time - put stool under desk to elevate feet a bit Goes home and lay down   Doing her leg exercises for her hip replacement  New hip feels great    Lab Results  Component Value Date   NA 137 03/09/2023   K 4.2 03/09/2023   CO2 26 03/09/2023   GLUCOSE 87 03/09/2023   BUN 11 03/09/2023   CREATININE 0.72 03/09/2023   CALCIUM 9.0 03/09/2023   GFR 82.29 03/09/2023   GFRNONAA 93.10 11/25/2010   Lab Results  Component Value Date   ALT 12 04/30/2023   AST 17 04/30/2023   ALKPHOS 93 01/05/2023   BILITOT 0.9 01/05/2023   Lab Results  Component Value Date   TSH 3.27 01/05/2023   Takes levothyroxine 100 mcg daily     Hyperlipidemia  Lab Results  Component Value Date   CHOL 167 04/30/2023   HDL 57.90 04/30/2023   LDLCALC 88 04/30/2023   LDLDIRECT 146.3 08/22/2013   TRIG 107.0 04/30/2023   CHOLHDL 3 04/30/2023   Was taking atorvastatin 3 d per week   Has to skip  next prolia shot per ortho  Can get the one after that     Patient Active Problem List   Diagnosis Date Noted   Pedal edema 07/06/2023   Muscle pain 07/06/2023   Fatigue 04/20/2023   Pre-operative clearance 02/12/2023   Aortic atherosclerosis (HCC) 02/12/2023   Agatston coronary artery calcium score less than 100 02/12/2023   GERD (gastroesophageal reflux disease) 01/12/2023   Hyperlipidemia 01/01/2020   History of cold sores 01/01/2020   Loose stools 04/14/2019   External hemorrhoids 04/14/2019   Elevated random blood glucose level 04/14/2019   Screening mammogram, encounter for 12/26/2018   Fatty liver 12/26/2018   Colon cancer screening 12/03/2017   Estrogen deficiency 12/01/2016   B12 deficiency 12/01/2016   Routine general medical examination at a health care facility 11/20/2015   Encounter for Medicare annual wellness exam 08/21/2013   Encounter for screening mammogram for breast cancer 12/22/2011   ALLERGIC RHINITIS 12/14/2008   Vitamin D deficiency 08/23/2008   Hypothyroid 07/26/2008   Osteopenia 07/23/2008   Past Medical History:  Diagnosis Date  Allergy    allergic rhinitis   B12 deficiency    Back pain    disc disease lumbar   Colon polyp    Dyspepsia    Fatty liver    GERD (gastroesophageal reflux disease)    Graves' disease    Hyperlipidemia    Hyperthyroidism    Grave's disease (tx with radioactive I)   Hypothyroidism    Osteopenia    Vitamin D deficiency    Yeast vaginitis    recurrent- keeps terazol on hand / otc does not work   Past Surgical History:  Procedure Laterality Date   BREAST SURGERY     breast reduction   CARPAL TUNNEL RELEASE     CATARACT EXTRACTION, BILATERAL     CESAREAN SECTION     x 2   chin implant     COLONOSCOPY     COLONOSCOPY WITH PROPOFOL N/A 04/25/2018   Procedure: COLONOSCOPY WITH PROPOFOL;  Surgeon: Scot Jun, MD;  Location: [facility] ENDOSCOPY;  Service: Endoscopy;  Laterality: N/A;    ESOPHAGOGASTRODUODENOSCOPY (EGD) WITH PROPOFOL N/A 04/25/2018   Procedure: ESOPHAGOGASTRODUODENOSCOPY (EGD) WITH PROPOFOL;  Surgeon: Scot Jun, MD;  Location: [facility] ENDOSCOPY;  Service: Endoscopy;  Laterality: N/A;   EYE SURGERY     x3 for strabismus   REDUCTION MAMMAPLASTY Bilateral 1983   Social History   Tobacco Use   Smoking status: Former    Current packs/day: 0.00    Types: Cigarettes    Quit date: 11/23/1981    Years since quitting: 41.6   Smokeless tobacco: Never  Vaping Use   Vaping status: Never Used  Substance Use Topics   Alcohol use: Not Currently    Comment: rare   Drug use: No   Family History  Problem Relation Age of Onset   Osteoporosis Mother    Heart disease Mother        mild heart problem   Arthritis Father        RA   Breast cancer Cousin    Allergies  Allergen Reactions   Boniva [Ibandronic Acid]     Leg cramping    Zetia [Ezetimibe]     Joint pain    Current Outpatient Medications on File Prior to Visit  Medication Sig Dispense Refill   ascorbic acid (VITAMIN C) 500 MG tablet Take 500 mg by mouth 3 (three) times a week.     Cholecalciferol (VITAMIN D3) 5000 UNITS CAPS Take 1 capsule by mouth daily.     denosumab (PROLIA) 60 MG/ML SOSY injection Inject 60 mg into the skin every 6 (six) months.     esomeprazole (NEXIUM) 20 MG capsule Take 20 mg by mouth daily at 12 noon.     famotidine (PEPCID) 20 MG tablet Take 20 mg by mouth as needed for heartburn or indigestion.     fexofenadine-pseudoephedrine (ALLEGRA-D 12 HOUR) 60-120 MG 12 hr tablet Take 1 tablet by mouth 2 (two) times daily as needed. 60 tablet 11   Fluticasone Propionate (FLONASE NA) Place into the nose as needed.     hydrocortisone 1 % ointment APPLY EXTERNALLY TO HEMORRHOIDS ONCE DAILY AS NEEDED 28.35 g 1   levothyroxine (SYNTHROID) 100 MCG tablet Take 100 mcg by mouth daily. 100 mg 6 x a week and 1/2 tablet on Sunday     Liver Extract (LIVER PO) Take 1 tablet by mouth every other  day.      Magnesium Oxide 250 MG TABS Take 1 tablet by mouth daily as needed.  MISC NATURAL PRODUCTS IJ Inject as directed. Allergy Injection     Multiple Vitamin (MULTIVITAMIN) capsule Take 1 capsule by mouth daily.     Multiple Vitamins-Minerals (PRESERVISION AREDS 2) CAPS Take 2 Capfuls by mouth daily.     Omega-3 Fatty Acids (FISH OIL PO) Take 1,500 mg by mouth daily.     Probiotic Product (ALIGN PO) Take 1 capsule by mouth daily.     valACYclovir (VALTREX) 500 MG tablet START VALYCYCLOVIR 500MG  TWICE A DAY 24 HOURS PRIOR TO PROCEDURE AND CONTINUE TWICE DAILY X 5 DAYS TOTAL. FOR OUTBREAKS, TAKE 4 BY MOUTH AT ONSET OF SYMPTOMS AND 4 PO 12 HOURS LATER. 30 tablet 11   atorvastatin (LIPITOR) 10 MG tablet Take 1 tablet (10 mg total) by mouth every other day. Take 1/2 pill three days weekly (Patient not taking: Reported on 07/06/2023) 45 tablet 3   No current facility-administered medications on file prior to visit.    Review of Systems  Constitutional:  Negative for activity change, appetite change, fatigue, fever and unexpected weight change.  HENT:  Negative for congestion, ear pain, rhinorrhea, sinus pressure and sore throat.   Eyes:  Negative for pain, redness and visual disturbance.  Respiratory:  Negative for cough, shortness of breath and wheezing.   Cardiovascular:  Positive for leg swelling. Negative for chest pain and palpitations.  Gastrointestinal:  Negative for abdominal pain, blood in stool, constipation and diarrhea.  Endocrine: Negative for polydipsia and polyuria.  Genitourinary:  Negative for dysuria, frequency and urgency.  Musculoskeletal:  Positive for arthralgias and myalgias. Negative for back pain.       Pain is improved   Skin:  Negative for pallor and rash.  Allergic/Immunologic: Negative for environmental allergies.  Neurological:  Negative for dizziness, syncope and headaches.  Hematological:  Negative for adenopathy. Does not bruise/bleed easily.   Psychiatric/Behavioral:  Negative for decreased concentration and dysphoric mood. The patient is not nervous/anxious.        Objective:   Physical Exam Constitutional:      General: She is not in acute distress.    Appearance: Normal appearance. She is well-developed and normal weight. She is not ill-appearing or diaphoretic.  HENT:     Head: Normocephalic and atraumatic.     Mouth/Throat:     Mouth: Mucous membranes are moist.  Eyes:     General: No scleral icterus.    Conjunctiva/sclera: Conjunctivae normal.     Pupils: Pupils are equal, round, and reactive to light.  Neck:     Thyroid: No thyromegaly.     Vascular: No carotid bruit or JVD.  Cardiovascular:     Rate and Rhythm: Normal rate and regular rhythm.     Pulses: Normal pulses.     Heart sounds: Normal heart sounds.     No gallop.  Pulmonary:     Effort: Pulmonary effort is normal. No respiratory distress.     Breath sounds: Normal breath sounds. No stridor. No wheezing, rhonchi or rales.     Comments: No crackles  Abdominal:     General: There is no distension or abdominal bruit.     Palpations: Abdomen is soft.  Musculoskeletal:     Cervical back: Normal range of motion and neck supple.     Right lower leg: No edema.     Left lower leg: No edema.     Comments: Trace pedal edam on right  Almost none on left No pitting  Some spider veins  No tenderness or palp  cords   No acute joint changes No myofacial tenderness  Lymphadenopathy:     Cervical: No cervical adenopathy.  Skin:    General: Skin is warm and dry.     Coloration: Skin is not jaundiced or pale.     Findings: No bruising, erythema or rash.  Neurological:     Mental Status: She is alert.     Coordination: Coordination normal.     Deep Tendon Reflexes: Reflexes are normal and symmetric. Reflexes normal.  Psychiatric:        Mood and Affect: Mood normal.           Assessment & Plan:   Problem List Items Addressed This Visit        Endocrine   Hypothyroid    More ankle swelling recently  Takes levothyroxine 100 mcg daily  TSH ordered       Relevant Orders   TSH     Musculoskeletal and Integument   Osteopenia    Has to skip next prolia dose due to hip repl surgery -per order of ortho  Will pick up with next one 6 mo later         Other   Pedal edema    Unsure of cause Worse on right-but had THA this spring  Worsened after shinrix shot  Unsure if related to statin  Eating well/ good fluids Encouraged supp hose at work  Lab today  Reassuring exam-starting to improve  Has hypothyroidism as well  No cardiac signs and symptoms        Relevant Orders   Basic metabolic panel   Hepatic function panel   CBC with Differential/Platelet   Muscle pain    Ck with labs ? Statin related or shingrix related Holding atorvastatin  Lab today      Relevant Orders   CK   Hyperlipidemia - Primary    Unsure if crestor (three times weekly) added to pain and ankle edema She is holding and symptoms improved  Will continue to hold 1 mo  If better-may re try at twice weekly and let me know  CK added to labs today      Relevant Orders   Hepatic function panel   TSH

## 2023-07-06 NOTE — Assessment & Plan Note (Signed)
Ck with labs ? Statin related or shingrix related Holding atorvastatin  Lab today

## 2023-07-06 NOTE — Assessment & Plan Note (Signed)
Has to skip next prolia dose due to hip repl surgery -per order of ortho  Will pick up with next one 6 mo later

## 2023-07-06 NOTE — Patient Instructions (Addendum)
Stay off the generic lipitor for another month   Then if  feeling fine - can try taking it twice a week and let me know how it goes   Labs today for swelling  Glad you are doing better  Elevate feet when you sit  Consider support socks at work as long as you have air conditioning   Keep drinking water Keep avoiding sodium   Hold off on another shingles shot for now   Nsaids like ibuprofen or aleve cause swelling Avoid for now

## 2023-07-06 NOTE — Assessment & Plan Note (Signed)
More ankle swelling recently  Takes levothyroxine 100 mcg daily  TSH ordered

## 2023-07-06 NOTE — Assessment & Plan Note (Addendum)
Unsure of cause Worse on right-but had THA this spring  Worsened after shinrix shot  Unsure if related to statin  Eating well/ good fluids Encouraged supp hose at work  Lab today  Reassuring exam-starting to improve  Has hypothyroidism as well  No cardiac signs and symptoms

## 2023-07-06 NOTE — Assessment & Plan Note (Signed)
Unsure if crestor (three times weekly) added to pain and ankle edema She is holding and symptoms improved  Will continue to hold 1 mo  If better-may re try at twice weekly and let me know  CK added to labs today

## 2023-07-07 ENCOUNTER — Encounter: Payer: Self-pay | Admitting: Family Medicine

## 2023-07-08 ENCOUNTER — Encounter: Payer: Self-pay | Admitting: Dermatology

## 2023-07-20 DIAGNOSIS — M24812 Other specific joint derangements of left shoulder, not elsewhere classified: Secondary | ICD-10-CM | POA: Diagnosis not present

## 2023-07-20 DIAGNOSIS — M25512 Pain in left shoulder: Secondary | ICD-10-CM | POA: Diagnosis not present

## 2023-08-02 ENCOUNTER — Other Ambulatory Visit: Payer: Self-pay

## 2023-08-03 ENCOUNTER — Telehealth: Payer: Self-pay

## 2023-08-03 NOTE — Telephone Encounter (Deleted)
-----   Message from [facility] Shapale W sent at 07/06/2023 11:56 AM EDT ----- Regarding: Prolia This pt came in for an appt and wanted me to let you know she's due for her Prolia shot in Sept but she just had a hip replacement and her ortho told her to skip this shot and then pick it back up and start getting them again in March 2025 (6 months from when her next one is due)

## 2023-08-03 NOTE — Telephone Encounter (Signed)
-----   Message from [facility] Shapale W sent at 07/06/2023 11:56 AM EDT ----- Regarding: Prolia This pt came in for an appt and wanted me to let you know she's due for her Prolia shot in Sept but she just had a hip replacement and her ortho told her to skip this shot and then pick it back up and start getting them again in March 2025 (6 months from when her next one is due)

## 2023-08-14 ENCOUNTER — Encounter (HOSPITAL_COMMUNITY): Payer: Self-pay

## 2023-08-16 DIAGNOSIS — E890 Postprocedural hypothyroidism: Secondary | ICD-10-CM | POA: Diagnosis not present

## 2023-08-23 ENCOUNTER — Encounter: Payer: Self-pay | Admitting: Family Medicine

## 2023-08-23 DIAGNOSIS — E890 Postprocedural hypothyroidism: Secondary | ICD-10-CM | POA: Diagnosis not present

## 2023-08-23 DIAGNOSIS — M8580 Other specified disorders of bone density and structure, unspecified site: Secondary | ICD-10-CM | POA: Diagnosis not present

## 2023-08-26 ENCOUNTER — Telehealth: Payer: Self-pay

## 2023-08-26 MED ORDER — ALENDRONATE SODIUM 70 MG PO TABS: 70.0000 mg | ORAL_TABLET | ORAL | 0 refills | Status: DC

## 2023-08-26 NOTE — Telephone Encounter (Signed)
Please let me know as soon as benefits have been submitted.

## 2023-08-26 NOTE — Telephone Encounter (Signed)
Prolia VOB initiated via [email or web address]  Next Prolia inj DUE: 09/21/23

## 2023-08-26 NOTE — Telephone Encounter (Signed)
This pt needs prolia after 09/21/23 - as soon after that as possible Thanks!   Unsure if she will need repeat labs or not

## 2023-08-30 ENCOUNTER — Other Ambulatory Visit (HOSPITAL_COMMUNITY): Payer: Self-pay

## 2023-08-30 NOTE — Telephone Encounter (Signed)
Pt ready for scheduling for PROLIA on or after : 09/21/23  Out-of-pocket cost due at time of visit: $320  Primary: [employer] Prolia co-insurance: 20% Admin fee co-insurance: 0%  Secondary: --- Prolia co-insurance:  Admin fee co-insurance:   Medical Benefit Details: Date Benefits were checked: 08/27/23 Deductible: NO/ Coinsurance: 20%/ Admin Fee: 0%  Prior Auth: N/A PA# Expiration Date:   # of doses approved:  Pharmacy benefit: Copay $200 If patient wants fill through the pharmacy benefit please send prescription to: [employer], and include estimated need by date in rx notes. Pharmacy will ship medication directly to the office.  Patient NOT eligible for Prolia Copay Card. Copay Card can make patient's cost as little as $25. Link to apply: https://www.amgensupportplus.com/copay  ** This summary of benefits is an estimation of the patient's out-of-pocket cost. Exact cost may very based on individual plan coverage.

## 2023-09-06 NOTE — Telephone Encounter (Signed)
Called and spoke to patient. She would like to hold off on getting injection until after she is seen by ortho on 10/28. She will call our office and let our office know if she would like to have Prolia.

## 2023-09-16 ENCOUNTER — Other Ambulatory Visit: Payer: Self-pay | Admitting: Family Medicine

## 2023-09-20 ENCOUNTER — Encounter: Payer: Self-pay | Admitting: Family Medicine

## 2023-09-20 DIAGNOSIS — Z96641 Presence of right artificial hip joint: Secondary | ICD-10-CM | POA: Diagnosis not present

## 2023-09-20 NOTE — Telephone Encounter (Signed)
Will route to [facility] for review

## 2023-09-21 ENCOUNTER — Ambulatory Visit
Admission: RE | Admit: 2023-09-21 | Discharge: 2023-09-21 | Disposition: A | Discharge status: Home / Self Care | Payer: PPO | Source: Ambulatory Visit | Attending: Family Medicine | Admitting: Family Medicine

## 2023-09-21 ENCOUNTER — Telehealth: Payer: Self-pay | Admitting: *Deleted

## 2023-09-21 DIAGNOSIS — M8589 Other specified disorders of bone density and structure, multiple sites: Secondary | ICD-10-CM

## 2023-09-21 DIAGNOSIS — E2839 Other primary ovarian failure: Secondary | ICD-10-CM | POA: Insufficient documentation

## 2023-09-21 DIAGNOSIS — M810 Age-related osteoporosis without current pathological fracture: Secondary | ICD-10-CM | POA: Diagnosis not present

## 2023-09-21 NOTE — Telephone Encounter (Signed)
That sounds good-pending lab result Thanks

## 2023-09-21 NOTE — Telephone Encounter (Signed)
Pt called back and wanted to proceed with labs for Prolia, phone note has already been sent to [facility] regarding ordering Prolia, pt just needs Bmet, will route to PCP for review (lab pending)

## 2023-09-21 NOTE — Telephone Encounter (Signed)
Pt called back and scheduled an appt for labs tomorrow FYI

## 2023-09-22 ENCOUNTER — Telehealth: Payer: Self-pay

## 2023-09-22 ENCOUNTER — Other Ambulatory Visit: Payer: Self-pay | Admitting: Family Medicine

## 2023-09-22 ENCOUNTER — Other Ambulatory Visit (INDEPENDENT_AMBULATORY_CARE_PROVIDER_SITE_OTHER): Payer: PPO

## 2023-09-22 DIAGNOSIS — M8589 Other specified disorders of bone density and structure, multiple sites: Secondary | ICD-10-CM | POA: Diagnosis not present

## 2023-09-22 LAB — BASIC METABOLIC PANEL
BUN: 14 mg/dL (ref 6–23)
CO2: 28 meq/L (ref 19–32)
Calcium: 9.4 mg/dL (ref 8.4–10.5)
Chloride: 104 meq/L (ref 96–112)
Creatinine, Ser: 0.81 mg/dL (ref 0.40–1.20)
GFR: 71.17 mL/min (ref >60.00)
Glucose, Bld: 92 mg/dL (ref 70–99)
Potassium: 3.8 meq/L (ref 3.5–5.1)
Sodium: 140 meq/L (ref 135–145)

## 2023-09-22 MED ORDER — DENOSUMAB 60 MG/ML ~~LOC~~ SOSY: 60.0000 mg | PREFILLED_SYRINGE | Freq: Once | SUBCUTANEOUS | 0 refills | Status: AC

## 2023-09-22 NOTE — Telephone Encounter (Signed)
Error

## 2023-09-23 ENCOUNTER — Other Ambulatory Visit: Payer: Self-pay

## 2023-09-23 ENCOUNTER — Ambulatory Visit: Payer: PPO | Admitting: Dermatology

## 2023-09-23 MED ORDER — DENOSUMAB 60 MG/ML ~~LOC~~ SOSY: 60.0000 mg | PREFILLED_SYRINGE | SUBCUTANEOUS | 0 refills | Status: DC

## 2023-09-24 ENCOUNTER — Other Ambulatory Visit: Payer: Self-pay

## 2023-09-24 MED ORDER — DENOSUMAB 60 MG/ML ~~LOC~~ SOSY: 60.0000 mg | PREFILLED_SYRINGE | SUBCUTANEOUS | 0 refills | Status: DC

## 2023-10-05 ENCOUNTER — Ambulatory Visit: Payer: PPO

## 2023-10-05 DIAGNOSIS — M8589 Other specified disorders of bone density and structure, multiple sites: Secondary | ICD-10-CM

## 2023-10-05 MED ORDER — DENOSUMAB 60 MG/ML ~~LOC~~ SOSY
60.0000 mg | PREFILLED_SYRINGE | Freq: Once | SUBCUTANEOUS | Status: AC
  Administered 2023-10-05: 60 mg via SUBCUTANEOUS

## 2023-10-05 MED ORDER — DENOSUMAB 60 MG/ML ~~LOC~~ SOSY: 60.0000 mg | PREFILLED_SYRINGE | Freq: Once | SUBCUTANEOUS | Status: AC

## 2023-10-05 NOTE — Progress Notes (Signed)
Per orders of Dr. Milinda Antis, injection of Prolia given by Eual Fines, CMA in left arm. Patient tolerated injection well.

## 2023-11-15 DIAGNOSIS — M722 Plantar fascial fibromatosis: Secondary | ICD-10-CM | POA: Diagnosis not present

## 2023-11-15 DIAGNOSIS — M6702 Short Achilles tendon (acquired), left ankle: Secondary | ICD-10-CM | POA: Diagnosis not present

## 2023-11-30 ENCOUNTER — Ambulatory Visit: Payer: Self-pay | Admitting: Family Medicine

## 2023-11-30 ENCOUNTER — Ambulatory Visit (INDEPENDENT_AMBULATORY_CARE_PROVIDER_SITE_OTHER): Payer: PPO | Admitting: Family Medicine

## 2023-11-30 ENCOUNTER — Encounter: Payer: Self-pay | Admitting: Family Medicine

## 2023-11-30 VITALS — BP 122/70 | HR 72 | Temp 98.0°F | Ht 63.75 in | Wt 146.1 lb

## 2023-11-30 DIAGNOSIS — Z889 Allergy status to unspecified drugs, medicaments and biological substances status: Secondary | ICD-10-CM | POA: Diagnosis not present

## 2023-11-30 MED ORDER — METHYLPREDNISOLONE ACETATE 40 MG/ML IJ SUSP
40.0000 mg | Freq: Once | INTRAMUSCULAR | Status: AC
  Administered 2023-11-30: 40 mg via INTRAMUSCULAR

## 2023-11-30 MED ORDER — PREDNISONE 10 MG PO TABS: ORAL_TABLET | ORAL | 0 refills | Status: DC

## 2023-11-30 NOTE — Telephone Encounter (Signed)
  Chief Complaint: rash Symptoms: red rash from chest area to lower legs Frequency: 2 days Pertinent Negatives: Patient denies SOB, denies fever, denies throat issues, denies swelling to the face,   Disposition: [] ED /[] Urgent Care (no appt availability in office) / [x] Appointment(In office/virtual)/ []  [place] [facility]/ [] Home Care/ [] Refused Recommended Disposition /[] [place] [facility]/ []  Follow-up with PCP  Additional Notes: rash started, day 12 of a 14 day meloxicam treatment.  Stopped meloxicam treatment and started taking antihistamines. Pt states it is very itchy. Pt also suffering from pain, taking advil. Sched with PCP for 1130 today. Advised to go to ED should she develop SOB, advised no more meloxicam unless MD orders.  Copied from CRM [id number]. Topic: Clinical - Red Word Triage >> Nov 30, 2023 10:00 AM Macario HERO wrote: Red Word that prompted transfer to Nurse Triage: Allergic reaction after taking Meloxicam. Patient stated she developed a rash all over her body. Took antihistamine and still inflamed. Reason for Disposition  Large or small blisters on skin (i.e., fluid filled bubbles or sacs)  Answer Assessment - Initial Assessment Questions 1. APPEARANCE of RASH: Describe the rash. (e.g., spots, blisters, raised areas, skin peeling, scaly)     Raised, some small pimple like blisters, hot and burns, and it itches 2. SIZE: How big are the spots? (e.g., tip of pen, eraser, coin; inches, centimeters)     Whole inside leg is inflamed with redness  3. LOCATION: Where is the rash located?     Stomach, mid-waist, portion of chest, legs 4. COLOR: What color is the rash? (Note: It is difficult to assess rash color in people with darker-colored skin. When this situation occurs, simply ask the caller to describe what they see.)     red 5. ONSET: When did the rash begin?     2 days ago 6. FEVER: Do you have a fever? If Yes, ask: What is your temperature, how was it  measured, and when did it start?     denies 7. ITCHING: Does the rash itch? If Yes, ask: How bad is the itch? (Scale 1-10; or mild, moderate, severe)     severe 8. CAUSE: What do you think is causing the rash?     meloxicam 9. MEDICINE FACTORS: Have you started any new medicines within the last 2 weeks? (e.g., antibiotics)      meloxicam 10. OTHER SYMPTOMS: Do you have any other symptoms? (e.g., dizziness, headache, sore throat, joint pain)       A little bit of dizziness, and overall pain, just took some advil for the pain  Protocols used: Rash or Redness - [facility]

## 2023-11-30 NOTE — Patient Instructions (Addendum)
 Continue claritin 10 mg daily  Or you can try zyrtec 10 mg daily  Hold the allegra D  For severe itching you can take benadryl 25 mg (it can make you sleepy)  Stay cool  Warm areas will always itch worse   Steroid shot today  Start prednisone  taper tomorrow   Avoid all nsaids Ibuprofen (advil)  Aleve (naproxen) Meloxicam  Celebrex   Update if not starting to improve in a week or if worsening  If worse or any trouble breathing or mouth/throat swelling go to the ER

## 2023-11-30 NOTE — Telephone Encounter (Signed)
 Seen today.

## 2023-11-30 NOTE — Assessment & Plan Note (Signed)
 Pt has rash on trunk and extremities likely from meloxicam (after taking 2 weeks) Instructed to stay off of meloxicam and all other nsaids  No signs and symptoms of anaphylaxis and reassuring exam   Solu medrol  inj 40 mg IM today Start prednisone  taper tomorrow 30 mg (aware of side effects)   Instructed to try zyrtec 10 mg daily  (hold allegra and claritin) Additionally can take benadryl 25 mg for itching Instructed to keep cool  Handout given   Watch for signs and symptoms of anaphylaxis  Call back and Er precautions noted in detail today

## 2023-11-30 NOTE — Progress Notes (Signed)
 Subjective:    Patient ID: Angela Bell, female    DOB: 06-25-1946, 78 y.o.   MRN: [id number]  HPI  Wt Readings from Last 3 Encounters:  11/30/23 146 lb 2 oz (66.3 kg)  07/06/23 146 lb 2 oz (66.3 kg)  02/12/23 148 lb 8 oz (67.4 kg)   25.28 kg/m  Vitals:   11/30/23 1116  BP: 122/70  Pulse: 72  Temp: 98 F (36.7 C)  SpO2: 96%    Pt presents for c/o rash on abd and back and legs   Saw podiatrist for plantar fasciitis  Started her on meloxicam -was on it 2 weeks   Sunday rash started on legs  By Monday afternoon she was covered  Itchy  Waist / buttocks /arms and legs    Took antihistamine yesterday and today Claritin- took 20 mg  Also allegra D  Has not taken oral benadryl but did try cream  Also hydrocortisone  cream   No rash on face or scalp   No swelling of tongue /throat  No shortness of breath  No wheezing     Patient Active Problem List   Diagnosis Date Noted   Drug allergy 11/30/2023   Pedal edema 07/06/2023   Muscle pain 07/06/2023   Fatigue 04/20/2023   Pre-operative clearance 02/12/2023   Aortic atherosclerosis (HCC) 02/12/2023   Agatston coronary artery calcium  score less than 100 02/12/2023   GERD (gastroesophageal reflux disease) 01/12/2023   Hyperlipidemia 01/01/2020   History of cold sores 01/01/2020   Loose stools 04/14/2019   External hemorrhoids 04/14/2019   Elevated random blood glucose level 04/14/2019   Screening mammogram, encounter for 12/26/2018   Fatty liver 12/26/2018   Colon cancer screening 12/03/2017   Estrogen deficiency 12/01/2016   B12 deficiency 12/01/2016   Routine general medical examination at a health care facility 11/20/2015   Encounter for Medicare annual wellness exam 08/21/2013   Encounter for screening mammogram for breast cancer 12/22/2011   Allergic rhinitis 12/14/2008   Vitamin D  deficiency 08/23/2008   Hypothyroid 07/26/2008   Osteopenia 07/23/2008   Past Medical History:  Diagnosis Date    Allergy    allergic rhinitis   B12 deficiency    Back pain    disc disease lumbar   Colon polyp    Dyspepsia    Fatty liver    GERD (gastroesophageal reflux disease)    Graves' disease    Hyperlipidemia    Hyperthyroidism    Grave's disease (tx with radioactive I)   Hypothyroidism    Osteopenia    Vitamin D  deficiency    Yeast vaginitis    recurrent- keeps terazol on hand / otc does not work   Past Surgical History:  Procedure Laterality Date   BREAST SURGERY     breast reduction   CARPAL TUNNEL RELEASE     CATARACT EXTRACTION, BILATERAL     CESAREAN SECTION     x 2   chin implant     COLONOSCOPY     COLONOSCOPY WITH PROPOFOL  N/A 04/25/2018   Procedure: COLONOSCOPY WITH PROPOFOL ;  Surgeon: Viktoria Lamar DASEN, MD;  Location: [facility] ENDOSCOPY;  Service: Endoscopy;  Laterality: N/A;   ESOPHAGOGASTRODUODENOSCOPY (EGD) WITH PROPOFOL  N/A 04/25/2018   Procedure: ESOPHAGOGASTRODUODENOSCOPY (EGD) WITH PROPOFOL ;  Surgeon: Viktoria Lamar DASEN, MD;  Location: [facility] ENDOSCOPY;  Service: Endoscopy;  Laterality: N/A;   EYE SURGERY     x3 for strabismus   REDUCTION MAMMAPLASTY Bilateral 1983   Social History   Tobacco Use  Smoking status: Former    Current packs/day: 0.00    Types: Cigarettes    Quit date: 11/23/1981    Years since quitting: 42.0   Smokeless tobacco: Never  Vaping Use   Vaping status: Never Used  Substance Use Topics   Alcohol use: Not Currently    Comment: rare   Drug use: No   Family History  Problem Relation Age of Onset   Osteoporosis Mother    Heart disease Mother        mild heart problem   Arthritis Father        RA   Breast cancer Cousin    Allergies  Allergen Reactions   Boniva [Ibandronic Acid]     Leg cramping    Zetia  [Ezetimibe ]     Joint pain    Meloxicam Rash   Current Outpatient Medications on File Prior to Visit  Medication Sig Dispense Refill   alendronate  (FOSAMAX ) 70 MG tablet TAKE 1 TABLET (70 MG TOTAL) BY MOUTH EVERY 7 DAYS WITH FULL  GLASS WATER ON EMPTY STOMACH 12 tablet 1   ascorbic acid (VITAMIN C) 500 MG tablet Take 500 mg by mouth 3 (three) times a week.     atorvastatin  (LIPITOR) 10 MG tablet Take 1 tablet (10 mg total) by mouth every other day. Take 1/2 pill three days weekly 45 tablet 3   Cholecalciferol (VITAMIN D3) 5000 UNITS CAPS Take 1 capsule by mouth daily.     denosumab  (PROLIA ) 60 MG/ML SOSY injection Inject 60 mg into the skin every 6 (six) months. 1 mL 0   esomeprazole (NEXIUM) 20 MG capsule Take 20 mg by mouth daily as needed.     famotidine (PEPCID) 20 MG tablet Take 20 mg by mouth as needed for heartburn or indigestion.     fexofenadine -pseudoephedrine (ALLEGRA-D 12 HOUR) 60-120 MG 12 hr tablet Take 1 tablet by mouth 2 (two) times daily as needed. 60 tablet 11   Fluticasone Propionate (FLONASE NA) Place into the nose as needed.     hydrocortisone  1 % ointment APPLY EXTERNALLY TO HEMORRHOIDS ONCE DAILY AS NEEDED 28.35 g 1   levothyroxine  (SYNTHROID ) 100 MCG tablet Take 100 mcg by mouth daily. 100 mg 6 x a week and 1/2 tablet on Sunday     Liver Extract (LIVER PO) Take 1 tablet by mouth every other day.      Magnesium Oxide 250 MG TABS Take 1 tablet by mouth daily as needed.     MISC NATURAL PRODUCTS IJ Inject as directed. Allergy Injection     Multiple Vitamin (MULTIVITAMIN) capsule Take 1 capsule by mouth daily.     Multiple Vitamins-Minerals (PRESERVISION AREDS 2) CAPS Take 2 Capfuls by mouth daily.     Omega-3 Fatty Acids (FISH OIL PO) Take 1,500 mg by mouth daily.     Probiotic Product (ALIGN PO) Take 1 capsule by mouth daily.     valACYclovir  (VALTREX ) 500 MG tablet START VALYCYCLOVIR 500MG  TWICE A DAY 24 HOURS PRIOR TO PROCEDURE AND CONTINUE TWICE DAILY X 5 DAYS TOTAL. FOR OUTBREAKS, TAKE 4 BY MOUTH AT ONSET OF SYMPTOMS AND 4 PO 12 HOURS LATER. 30 tablet 11   Current Facility-Administered Medications on File Prior to Visit  Medication Dose Route Frequency Provider Last Rate Last Admin   [START ON  04/03/2024] denosumab  (PROLIA ) injection 60 mg  60 mg Subcutaneous Once Elayjah Chaney, Laine LABOR, MD        Review of Systems  Constitutional:  Positive for fatigue.  Some fatigue from antihistamine   HENT:  Negative for congestion, postnasal drip, rhinorrhea, sore throat, trouble swallowing and voice change.   Respiratory:  Negative for cough, shortness of breath, wheezing and stridor.   Musculoskeletal:  Positive for arthralgias.  Skin:  Positive for rash.  Neurological:  Negative for headaches.       Objective:   Physical Exam Constitutional:      General: She is not in acute distress.    Appearance: Normal appearance. She is well-developed and normal weight. She is not ill-appearing or diaphoretic.  HENT:     Head: Normocephalic and atraumatic.     Comments: No facial swelling    Right Ear: Tympanic membrane and ear canal normal.     Left Ear: Tympanic membrane and ear canal normal.     Nose: No congestion or rhinorrhea.     Mouth/Throat:     Mouth: Mucous membranes are moist.     Pharynx: Oropharynx is clear. No oropharyngeal exudate or posterior oropharyngeal erythema.     Comments: No swelling mouth /tongue or throat  Eyes:     General: No scleral icterus.       Right eye: No discharge.        Left eye: No discharge.     Conjunctiva/sclera: Conjunctivae normal.     Pupils: Pupils are equal, round, and reactive to light.  Neck:     Thyroid : No thyromegaly.     Vascular: No carotid bruit or JVD.  Cardiovascular:     Rate and Rhythm: Normal rate and regular rhythm.     Heart sounds: Normal heart sounds.     No gallop.  Pulmonary:     Effort: Pulmonary effort is normal. No respiratory distress.     Breath sounds: Normal breath sounds. No stridor. No wheezing, rhonchi or rales.     Comments: Good air exch Abdominal:     General: There is no distension or abdominal bruit.     Palpations: Abdomen is soft.  Musculoskeletal:     Cervical back: Normal range of motion and neck  supple.     Right lower leg: No edema.     Left lower leg: No edema.  Lymphadenopathy:     Cervical: No cervical adenopathy.  Skin:    General: Skin is warm and dry.     Coloration: Skin is not pale.     Findings: No rash.     Comments: Maculopapular erythematous rash with some confluent areas on trunk and proximal arms and legs  Spares face/scalp/palms /soles  No excoriations  No vesicles or drainage  No swelling   Neurological:     Mental Status: She is alert.     Coordination: Coordination normal.     Deep Tendon Reflexes: Reflexes are normal and symmetric. Reflexes normal.  Psychiatric:        Mood and Affect: Mood normal.           Assessment & Plan:   Problem List Items Addressed This Visit       Other   Drug allergy - Primary   Pt has rash on trunk and extremities likely from meloxicam (after taking 2 weeks) Instructed to stay off of meloxicam and all other nsaids  No signs and symptoms of anaphylaxis and reassuring exam   Solu medrol  inj 40 mg IM today Start prednisone  taper tomorrow 30 mg (aware of side effects)   Instructed to try zyrtec 10 mg daily  (hold allegra and claritin) Additionally can  take benadryl 25 mg for itching Instructed to keep cool  Handout given   Watch for signs and symptoms of anaphylaxis  Call back and Er precautions noted in detail today

## 2023-12-01 ENCOUNTER — Encounter: Payer: Self-pay | Admitting: Family Medicine

## 2023-12-01 MED ORDER — HYDROXYZINE PAMOATE 25 MG PO CAPS: 25.0000 mg | ORAL_CAPSULE | Freq: Three times a day (TID) | ORAL | 1 refills | Status: AC | PRN

## 2023-12-01 MED ORDER — PREDNISONE 20 MG PO TABS: ORAL_TABLET | ORAL | 0 refills | Status: DC

## 2023-12-07 ENCOUNTER — Other Ambulatory Visit: Payer: Self-pay | Admitting: Family Medicine

## 2023-12-07 DIAGNOSIS — Z1231 Encounter for screening mammogram for malignant neoplasm of breast: Secondary | ICD-10-CM

## 2023-12-13 DIAGNOSIS — T7840XA Allergy, unspecified, initial encounter: Secondary | ICD-10-CM | POA: Diagnosis not present

## 2023-12-14 ENCOUNTER — Encounter: Payer: Self-pay | Admitting: Dermatology

## 2023-12-14 ENCOUNTER — Ambulatory Visit: Payer: PPO | Admitting: Dermatology

## 2023-12-14 DIAGNOSIS — W908XXA Exposure to other nonionizing radiation, initial encounter: Secondary | ICD-10-CM

## 2023-12-14 DIAGNOSIS — L820 Inflamed seborrheic keratosis: Secondary | ICD-10-CM

## 2023-12-14 DIAGNOSIS — L578 Other skin changes due to chronic exposure to nonionizing radiation: Secondary | ICD-10-CM

## 2023-12-14 DIAGNOSIS — L814 Other melanin hyperpigmentation: Secondary | ICD-10-CM | POA: Diagnosis not present

## 2023-12-14 DIAGNOSIS — D1801 Hemangioma of skin and subcutaneous tissue: Secondary | ICD-10-CM

## 2023-12-14 DIAGNOSIS — Z1283 Encounter for screening for malignant neoplasm of skin: Secondary | ICD-10-CM | POA: Diagnosis not present

## 2023-12-14 DIAGNOSIS — R21 Rash and other nonspecific skin eruption: Secondary | ICD-10-CM

## 2023-12-14 DIAGNOSIS — L821 Other seborrheic keratosis: Secondary | ICD-10-CM | POA: Diagnosis not present

## 2023-12-14 DIAGNOSIS — L84 Corns and callosities: Secondary | ICD-10-CM

## 2023-12-14 DIAGNOSIS — D229 Melanocytic nevi, unspecified: Secondary | ICD-10-CM

## 2023-12-14 NOTE — Progress Notes (Signed)
Follow-Up Visit   Subjective  Angela Bell is a 78 y.o. female who presents for the following: Skin Cancer Screening and Full Body Skin Exam  The patient presents for Total-Body Skin Exam (TBSE) for skin cancer screening and mole check. The patient has spots, moles and lesions to be evaluated, some may be new or changing and the patient may have concern these could be cancer.  Patient had allergic reaction to meloxicam 11/30/23, saw her PCP and was put on a 2 week taper of prednisone. Patient advises that the only thing itching now is her feet. She is only using OTC Benadryl and now taking antihistamine.   Patient advises she has had something removed at face by Dr. Sedalia Muta and they had to go back a 2nd time to get it all but we do not have any records of previous skin cancers.   The following portions of the chart were reviewed this encounter and updated as appropriate: medications, allergies, medical history  Review of Systems:  No other skin or systemic complaints except as noted in HPI or Assessment and Plan.  Objective  Well appearing patient in no apparent distress; mood and affect are within normal limits.  A full examination was performed including scalp, head, eyes, ears, nose, lips, neck, chest, axillae, abdomen, back, buttocks, bilateral upper extremities, bilateral lower extremities, hands, feet, fingers, toes, fingernails, and toenails. All findings within normal limits unless otherwise noted below.   Exam of nails limited by presence of nail polish.  Exam of face limited by presence of make up.   Relevant physical exam findings are noted in the Assessment and Plan.  R Upper Arm x 1, L forearm x 6 (7) Erythematous stuck-on, waxy papule or plaque  Assessment & Plan   SKIN CANCER SCREENING PERFORMED TODAY.  ACTINIC DAMAGE - Chronic condition, secondary to cumulative UV/sun exposure - diffuse scaly erythematous macules with underlying dyspigmentation - Recommend  daily broad spectrum sunscreen SPF 30+ to sun-exposed areas, reapply every 2 hours as needed.  - Staying in the shade or wearing long sleeves, sun glasses (UVA+UVB protection) and wide brim hats (4-inch brim around the entire circumference of the hat) are also recommended for sun protection.  - Call for new or changing lesions.  LENTIGINES, SEBORRHEIC KERATOSES, HEMANGIOMAS - Benign normal skin lesions - Benign-appearing - Call for any changes  MELANOCYTIC NEVI - Tan-brown and/or pink-flesh-colored symmetric macules and papules - Benign appearing on exam today - Observation - Call clinic for new or changing moles - Recommend daily use of broad spectrum spf 30+ sunscreen to sun-exposed areas.   Rash Exam: Erythematous macules and numerous excoriations on trunk extremities feet  Differential diagnosis:  Resolving allergic reaction to Meloxicam per other providers  Treatment Plan: May continue HC BID as needed for itch  CALLUS  Exam: between R 4th and 5th toes  Treatment Plan: Recommend OTC salicylic acid daily Sample of CeraVe SA given to patient.   INFLAMED SEBORRHEIC KERATOSIS (7) R Upper Arm x 1, L forearm x 6 (7) Symptomatic, irritating, patient would like treated.  Benign-appearing.  Call clinic for new or changing lesions.   Destruction of lesion - R Upper Arm x 1, L forearm x 6 (7) Complexity: simple   Destruction method: cryotherapy   Informed consent: discussed and consent obtained   Timeout:  patient name, date of birth, surgical site, and procedure verified Lesion destroyed using liquid nitrogen: Yes   Region frozen until ice ball extended beyond lesion: Yes  Cryo cycles: 1 or 2. Outcome: patient tolerated procedure well with no complications   Post-procedure details: wound care instructions given   MULTIPLE BENIGN NEVI   LENTIGINES   ACTINIC ELASTOSIS   SEBORRHEIC KERATOSES   CHERRY ANGIOMA   RASH   CALLUS   Return in about 1 year  (around 12/13/2024) for TBSE.  Anise Salvo, RMA, am acting as scribe for Elie Goody, MD .   Documentation: I have reviewed the above documentation for accuracy and completeness, and I agree with the above.  Elie Goody, MD

## 2023-12-14 NOTE — Patient Instructions (Addendum)
Cryotherapy Aftercare  Wash gently with soap and water everyday.   Apply Vaseline and Band-Aid daily until healed.   Recommend an over the counter cream with salicylic acid for callous between right 4th and 5th toes.    Melanoma ABCDEs  Melanoma is the most dangerous type of skin cancer, and is the leading cause of death from skin disease.  You are more likely to develop melanoma if you: Have light-colored skin, light-colored eyes, or red or blond hair Spend a lot of time in the sun Tan regularly, either outdoors or in a tanning bed Have had blistering sunburns, especially during childhood Have a close family member who has had a melanoma Have atypical moles or large birthmarks  Early detection of melanoma is key since treatment is typically straightforward and cure rates are extremely high if we catch it early.   The first sign of melanoma is often a change in a mole or a new dark spot.  The ABCDE system is a way of remembering the signs of melanoma.  A for asymmetry:  The two halves do not match. B for border:  The edges of the growth are irregular. C for color:  A mixture of colors are present instead of an even brown color. D for diameter:  Melanomas are usually (but not always) greater than 6mm - the size of a pencil eraser. E for evolution:  The spot keeps changing in size, shape, and color.  Please check your skin once per month between visits. You can use a small mirror in front and a large mirror behind you to keep an eye on the back side or your body.   If you see any new or changing lesions before your next follow-up, please call to schedule a visit.  Please continue daily skin protection including broad spectrum sunscreen SPF 30+ to sun-exposed areas, reapplying every 2 hours as needed when you're outdoors.    Due to recent changes in healthcare laws, you may see results of your pathology and/or laboratory studies on MyChart before the doctors have had a chance to review  them. We understand that in some cases there may be results that are confusing or concerning to you. Please understand that not all results are received at the same time and often the doctors may need to interpret multiple results in order to provide you with the best plan of care or course of treatment. Therefore, we ask that you please give [place] 2 business days to thoroughly review all your results before contacting the office for clarification. Should we see a critical lab result, you will be contacted sooner.   If You Need Anything After Your Visit  If you have any questions or concerns for your doctor, please call our main line at [phone] and press option 4 to reach your doctor's medical assistant. If no one answers, please leave a voicemail as directed and we will return your call as soon as possible. Messages left after 4 pm will be answered the following business day.   You may also send [place] a message via MyChart. We typically respond to MyChart messages within 1-2 business days.  For prescription refills, please ask your pharmacy to contact our office. Our fax number is [phone].  If you have an urgent issue when the clinic is closed that cannot wait until the next business day, you can page your doctor at the number below.    Please note that while we do our best to be available  for urgent issues outside of office hours, we are not available 24/7.   If you have an urgent issue and are unable to reach [place], you may choose to seek medical care at your doctor's office, retail clinic, urgent care center, or emergency room.  If you have a medical emergency, please immediately call 911 or go to the emergency department.  Pager Numbers  - Dr. Gwen Pounds: [phone]  - Dr. Roseanne Reno: [phone]  - Dr. Katrinka Blazing: [phone]   In the event of inclement weather, please call our main line at [phone] for an update on the status of any delays or closures.  Dermatology Medication  Tips: Please keep the boxes that topical medications come in in order to help keep track of the instructions about where and how to use these. Pharmacies typically print the medication instructions only on the boxes and not directly on the medication tubes.   If your medication is too expensive, please contact our office at [phone] option 4 or send [place] a message through MyChart.   We are unable to tell what your co-pay for medications will be in advance as this is different depending on your insurance coverage. However, we may be able to find a substitute medication at lower cost or fill out paperwork to get insurance to cover a needed medication.   If a prior authorization is required to get your medication covered by your insurance company, please allow [place] 1-2 business days to complete this process.  Drug prices often vary depending on where the prescription is filled and some pharmacies may offer cheaper prices.  The website www.goodrx.com contains coupons for medications through different pharmacies. The prices here do not account for what the cost may be with help from insurance (it may be cheaper with your insurance), but the website can give you the price if you did not use any insurance.  - You can print the associated coupon and take it with your prescription to the pharmacy.  - You may also stop by our office during regular business hours and pick up a GoodRx coupon card.  - If you need your prescription sent electronically to a different pharmacy, notify our office through [employer] or by phone at [phone] option 4.     Si Usted Necesita Algo Despus de Su Visita  Tambin puede enviarnos un mensaje a travs de Clinical cytogeneticist. Por lo general respondemos a los mensajes de MyChart en el transcurso de 1 a 2 das hbiles.  Para renovar recetas, por favor pida a su farmacia que se ponga en contacto con nuestra oficina. Annie Sable de fax es [place] [phone].  Si tiene un  asunto urgente cuando la clnica est cerrada y que no puede esperar hasta el siguiente da hbil, puede llamar/localizar a su doctor(a) al nmero que aparece a continuacin.   Por favor, tenga en cuenta que aunque hacemos todo lo posible para estar disponibles para asuntos urgentes fuera del horario de [place], no estamos disponibles las 24 horas del da, los 7 [address]  [address] de la [place].   Si tiene un problema urgente y no puede comunicarse con nosotros, puede optar por buscar atencin mdica  en el consultorio de su doctor(a), en una clnica privada, en un centro de atencin urgente o en una sala de emergencias.  Si tiene Engineer, drilling, por favor llame inmediatamente al 911 o vaya a la sala de emergencias.  Nmeros de bper  - Dr. Gwen Pounds: [phone]  - Dra. Roseanne Reno: [phone]  - Dr. Katrinka Blazing:  [phone]   En caso de inclemencias del [place], por favor llame a nuestra lnea principal al [phone] para una actualizacin sobre el [place] de cualquier retraso o cierre.  Consejos para la medicacin en dermatologa: Por favor, guarde las cajas en las que vienen los medicamentos de uso tpico para ayudarle a seguir las instrucciones sobre dnde y cmo usarlos. Las farmacias generalmente imprimen las instrucciones del medicamento slo en las cajas y no directamente en los tubos del [place].   Si su medicamento es muy caro, por favor, pngase en contacto con Rolm Gala llamando al [phone] y presione la opcin 4 o envenos un mensaje a travs de Clinical cytogeneticist.   No podemos decirle cul ser su copago por los medicamentos por adelantado ya que esto es diferente dependiendo de la cobertura de su seguro. Sin embargo, es posible que podamos encontrar un medicamento sustituto a Audiological scientist un formulario para que el seguro cubra el medicamento que se considera necesario.   Si se requiere una autorizacin previa para que su compaa de seguros [place] su medicamento, por favor  permtanos de 1 a 2 das hbiles para completar [address]  Los precios de los medicamentos varan con frecuencia dependiendo del Environmental consultant de dnde se surte la receta y alguna farmacias pueden ofrecer precios ms baratos.  El sitio web www.goodrx.com tiene cupones para medicamentos de Health and safety inspector. Los precios aqu no tienen en cuenta lo que podra costar con la ayuda del seguro (puede ser ms barato con su seguro), pero el sitio web puede darle el precio si no utiliz Tourist information centre manager.  - Puede imprimir el cupn correspondiente y llevarlo con su receta a la farmacia.  - Tambin puede pasar por nuestra oficina durante el horario de atencin regular y Education officer, museum una tarjeta de cupones de GoodRx.  - Si necesita que su receta se enve electrnicamente a una farmacia diferente, informe a nuestra oficina a travs de MyChart de [place] o por telfono llamando al [phone] y presione la opcin 4.

## 2023-12-16 ENCOUNTER — Ambulatory Visit: Payer: PPO | Admitting: Dermatology

## 2023-12-21 ENCOUNTER — Ambulatory Visit
Admission: RE | Admit: 2023-12-21 | Discharge: 2023-12-21 | Disposition: A | Discharge status: Home / Self Care | Payer: PPO | Source: Ambulatory Visit | Attending: Family Medicine | Admitting: Family Medicine

## 2023-12-21 DIAGNOSIS — Z1231 Encounter for screening mammogram for malignant neoplasm of breast: Secondary | ICD-10-CM | POA: Insufficient documentation

## 2023-12-23 ENCOUNTER — Encounter: Payer: Self-pay | Admitting: Family Medicine

## 2023-12-24 ENCOUNTER — Telehealth: Payer: Self-pay

## 2023-12-24 NOTE — Telephone Encounter (Signed)
Prolia VOB initiated via [email or web address]  Next Prolia inj DUE: 04/03/24

## 2023-12-27 ENCOUNTER — Other Ambulatory Visit (HOSPITAL_COMMUNITY): Payer: Self-pay

## 2023-12-27 NOTE — Telephone Encounter (Addendum)
 Pt ready for scheduling for PROLIA  on or after : 04/03/24  Out-of-pocket cost due at time of visit: $332  Number of injection/visits approved: ---  Primary: [employer] Prolia  co-insurance: 20% Admin fee co-insurance: 0%  Secondary: --- Prolia  co-insurance:  Admin fee co-insurance:   Medical Benefit Details: Date Benefits were checked: 12/24/23 Deductible: NO/ Coinsurance: 20%/ Admin Fee: 0%  Prior Auth: N/A PA# Expiration Date:   # of doses approved:  Pharmacy benefit: Copay $250 If patient wants fill through the pharmacy benefit please send prescription to: [employer], and include estimated need by date in rx notes. Pharmacy will ship medication directly to the office.  Patient NOT eligible for Prolia  Copay Card. Copay Card can make patient's cost as little as $25. Link to apply: https://www.amgensupportplus.com/copay  ** This summary of benefits is an estimation of the patient's out-of-pocket cost. Exact cost may very based on individual plan coverage.

## 2024-01-03 ENCOUNTER — Other Ambulatory Visit: Payer: Self-pay

## 2024-01-07 ENCOUNTER — Other Ambulatory Visit: Payer: PPO

## 2024-01-09 ENCOUNTER — Telehealth: Payer: Self-pay | Admitting: Family Medicine

## 2024-01-09 DIAGNOSIS — E890 Postprocedural hypothyroidism: Secondary | ICD-10-CM

## 2024-01-09 DIAGNOSIS — Z0000 Encounter for general adult medical examination without abnormal findings: Secondary | ICD-10-CM

## 2024-01-09 DIAGNOSIS — E559 Vitamin D deficiency, unspecified: Secondary | ICD-10-CM

## 2024-01-09 DIAGNOSIS — E538 Deficiency of other specified B group vitamins: Secondary | ICD-10-CM

## 2024-01-09 DIAGNOSIS — E782 Mixed hyperlipidemia: Secondary | ICD-10-CM

## 2024-01-09 DIAGNOSIS — R739 Hyperglycemia, unspecified: Secondary | ICD-10-CM

## 2024-01-09 NOTE — Telephone Encounter (Signed)
-----   Message from Lovena Neighbours sent at 12/22/2023  2:59 PM EST ----- Regarding: Labs for Monday 2.17.25 Please put physical lab orders in future. Thank you, Denny Peon

## 2024-01-10 ENCOUNTER — Other Ambulatory Visit (INDEPENDENT_AMBULATORY_CARE_PROVIDER_SITE_OTHER): Payer: PPO

## 2024-01-10 DIAGNOSIS — E782 Mixed hyperlipidemia: Secondary | ICD-10-CM

## 2024-01-10 DIAGNOSIS — E559 Vitamin D deficiency, unspecified: Secondary | ICD-10-CM | POA: Diagnosis not present

## 2024-01-10 DIAGNOSIS — E538 Deficiency of other specified B group vitamins: Secondary | ICD-10-CM | POA: Diagnosis not present

## 2024-01-10 DIAGNOSIS — R739 Hyperglycemia, unspecified: Secondary | ICD-10-CM

## 2024-01-10 DIAGNOSIS — E890 Postprocedural hypothyroidism: Secondary | ICD-10-CM | POA: Diagnosis not present

## 2024-01-10 LAB — LIPID PANEL
Cholesterol: 253 mg/dL — ABNORMAL HIGH (ref 0–200)
HDL: 53.8 mg/dL (ref >39.00)
LDL Cholesterol: 164 mg/dL — ABNORMAL HIGH (ref 0–99)
NonHDL: 199.18
Total CHOL/HDL Ratio: 5
Triglycerides: 174 mg/dL — ABNORMAL HIGH (ref 0.0–149.0)
VLDL: 34.8 mg/dL (ref 0.0–40.0)

## 2024-01-10 LAB — CBC WITH DIFFERENTIAL/PLATELET
Basophils Absolute: 0.1 10*3/uL (ref 0.0–0.1)
Basophils Relative: 0.8 % (ref 0.0–3.0)
Eosinophils Absolute: 0.1 10*3/uL (ref 0.0–0.7)
Eosinophils Relative: 1.3 % (ref 0.0–5.0)
HCT: 40.5 % (ref 36.0–46.0)
Hemoglobin: 13.5 g/dL (ref 12.0–15.0)
Lymphocytes Relative: 24.2 % (ref 12.0–46.0)
Lymphs Abs: 1.5 10*3/uL (ref 0.7–4.0)
MCHC: 33.4 g/dL (ref 30.0–36.0)
MCV: 95.9 fL (ref 78.0–100.0)
Monocytes Absolute: 0.5 10*3/uL (ref 0.1–1.0)
Monocytes Relative: 8.9 % (ref 3.0–12.0)
Neutro Abs: 3.9 10*3/uL (ref 1.4–7.7)
Neutrophils Relative %: 64.8 % (ref 43.0–77.0)
Platelets: 295 10*3/uL (ref 150.0–400.0)
RBC: 4.23 Mil/uL (ref 3.87–5.11)
RDW: 14.3 % (ref 11.5–15.5)
WBC: 6.1 10*3/uL (ref 4.0–10.5)

## 2024-01-10 LAB — COMPREHENSIVE METABOLIC PANEL
ALT: 13 U/L (ref 0–35)
AST: 16 U/L (ref 0–37)
Albumin: 4.1 g/dL (ref 3.5–5.2)
Alkaline Phosphatase: 100 U/L (ref 39–117)
BUN: 13 mg/dL (ref 6–23)
CO2: 27 meq/L (ref 19–32)
Calcium: 8.9 mg/dL (ref 8.4–10.5)
Chloride: 106 meq/L (ref 96–112)
Creatinine, Ser: 0.73 mg/dL (ref 0.40–1.20)
GFR: 80.46 mL/min (ref >60.00)
Glucose, Bld: 94 mg/dL (ref 70–99)
Potassium: 4 meq/L (ref 3.5–5.1)
Sodium: 141 meq/L (ref 135–145)
Total Bilirubin: 0.4 mg/dL (ref 0.2–1.2)
Total Protein: 6.8 g/dL (ref 6.0–8.3)

## 2024-01-10 LAB — VITAMIN B12: Vitamin B-12: 716 pg/mL (ref 211–911)

## 2024-01-10 LAB — VITAMIN D 25 HYDROXY (VIT D DEFICIENCY, FRACTURES): VITD: 50.46 ng/mL (ref 30.00–100.00)

## 2024-01-10 LAB — HEMOGLOBIN A1C: Hgb A1c MFr Bld: 5.8 % (ref 4.6–6.5)

## 2024-01-10 LAB — TSH: TSH: 3.67 u[IU]/mL (ref 0.35–5.50)

## 2024-01-14 ENCOUNTER — Encounter: Payer: PPO | Admitting: Family Medicine

## 2024-01-16 ENCOUNTER — Other Ambulatory Visit: Payer: Self-pay | Admitting: Family Medicine

## 2024-01-17 ENCOUNTER — Other Ambulatory Visit: Payer: PPO

## 2024-01-17 ENCOUNTER — Encounter: Payer: PPO | Admitting: Family Medicine

## 2024-01-17 ENCOUNTER — Ambulatory Visit (INDEPENDENT_AMBULATORY_CARE_PROVIDER_SITE_OTHER): Payer: PPO | Admitting: Family Medicine

## 2024-01-17 ENCOUNTER — Encounter: Payer: Self-pay | Admitting: Family Medicine

## 2024-01-17 VITALS — BP 112/68 | HR 98 | Temp 98.2°F | Ht 64.0 in | Wt 141.1 lb

## 2024-01-17 DIAGNOSIS — I700 Atherosclerosis of aorta: Secondary | ICD-10-CM

## 2024-01-17 DIAGNOSIS — E782 Mixed hyperlipidemia: Secondary | ICD-10-CM

## 2024-01-17 DIAGNOSIS — E538 Deficiency of other specified B group vitamins: Secondary | ICD-10-CM

## 2024-01-17 DIAGNOSIS — R7303 Prediabetes: Secondary | ICD-10-CM

## 2024-01-17 DIAGNOSIS — E559 Vitamin D deficiency, unspecified: Secondary | ICD-10-CM | POA: Diagnosis not present

## 2024-01-17 DIAGNOSIS — K760 Fatty (change of) liver, not elsewhere classified: Secondary | ICD-10-CM

## 2024-01-17 DIAGNOSIS — K219 Gastro-esophageal reflux disease without esophagitis: Secondary | ICD-10-CM

## 2024-01-17 DIAGNOSIS — K644 Residual hemorrhoidal skin tags: Secondary | ICD-10-CM

## 2024-01-17 DIAGNOSIS — M810 Age-related osteoporosis without current pathological fracture: Secondary | ICD-10-CM

## 2024-01-17 DIAGNOSIS — Z0000 Encounter for general adult medical examination without abnormal findings: Secondary | ICD-10-CM

## 2024-01-17 DIAGNOSIS — M8589 Other specified disorders of bone density and structure, multiple sites: Secondary | ICD-10-CM

## 2024-01-17 DIAGNOSIS — R931 Abnormal findings on diagnostic imaging of heart and coronary circulation: Secondary | ICD-10-CM

## 2024-01-17 DIAGNOSIS — E890 Postprocedural hypothyroidism: Secondary | ICD-10-CM | POA: Diagnosis not present

## 2024-01-17 DIAGNOSIS — R739 Hyperglycemia, unspecified: Secondary | ICD-10-CM

## 2024-01-17 DIAGNOSIS — Z23 Encounter for immunization: Secondary | ICD-10-CM | POA: Diagnosis not present

## 2024-01-17 DIAGNOSIS — E2839 Other primary ovarian failure: Secondary | ICD-10-CM

## 2024-01-17 DIAGNOSIS — Z1211 Encounter for screening for malignant neoplasm of colon: Secondary | ICD-10-CM

## 2024-01-17 MED ORDER — HYDROCORTISONE 1 % EX OINT: TOPICAL_OINTMENT | CUTANEOUS | 1 refills | Status: AC

## 2024-01-17 NOTE — Patient Instructions (Addendum)
 Stay active  Add some strength training to your routine, this is important for bone and brain health and can reduce your risk of falls and help your body use insulin properly and regulate weight  Light weights, exercise bands , and internet videos are a good way to start  Yoga (chair or regular), machines , floor exercises or a gym with machines are also good options    Start working on upper body strength   To prevent diabetes Try to get most of your carbohydrates from produce (with the exception of white potatoes) and whole grains Eat less bread/pasta/rice/snack foods/cereals/sweets and other items from the middle of the grocery store (processed carbs)   I put the referral in for pharmacy to discuss options for cholesterol  Please let us know if you don't hear in 1-2 weeks   Pneumonia shot today

## 2024-01-17 NOTE — Assessment & Plan Note (Signed)
 Normal labs  Lab Results  Component Value Date   ALT 13 01/10/2024   AST 16 01/10/2024   ALKPHOS 100 01/10/2024   BILITOT 0.4 01/10/2024   Good diet

## 2024-01-17 NOTE — Assessment & Plan Note (Signed)
 Hypothyroidism  Pt has no clinical changes No change in energy level/ hair or skin/ edema and no tremor Lab Results  Component Value Date   TSH 3.67 01/10/2024    Continues levothyroxine 100 mcg daily

## 2024-01-17 NOTE — Assessment & Plan Note (Addendum)
 Reviewed health habits including diet and exercise and skin cancer prevention Reviewed appropriate screening tests for age  Also reviewed health mt list, fam hx and immunization status , as well as social and family history   See HPI Labs reviewed and ordered Health Maintenance  Topic Date Due   COVID-19 Vaccine (5 - 2024-25 season) 12/15/2024*   Mammogram  12/20/2024   Medicare Annual Wellness Visit  01/16/2025   Colon Cancer Screening  04/25/2028   DTaP/Tdap/Td vaccine (4 - Td or Tdap) 09/12/2028   Pneumonia Vaccine  Completed   Flu Shot  Completed   DEXA scan (bone density measurement)  Completed   Hepatitis C Screening  Completed   Zoster (Shingles) Vaccine  Completed   HPV Vaccine  Aged Out  *Topic was postponed. The date shown is not the original due date.   Per pt -colonsocopy is due for polyp- she is unsure if she wants to schedule  Discussed fall prevention, supplements and exercise for bone density  Encouraged strength building exercise  Has regular derm visits  PHQ 0  Updated prevnar 20 per request (10 years) since pt works in [employer]

## 2024-01-17 NOTE — Progress Notes (Signed)
 Subjective:    Patient ID: Angela Bell, female    DOB: 1945-11-26, 78 y.o.   MRN: [id number]  HPI  Here for health maintenance exam and to review chronic medical problems   Wt Readings from Last 3 Encounters:  01/17/24 141 lb 2 oz (64 kg)  11/30/23 146 lb 2 oz (66.3 kg)  07/06/23 146 lb 2 oz (66.3 kg)   24.22 kg/m  Vitals:   01/17/24 0812  BP: 112/68  Pulse: 98  Temp: 98.2 F (36.8 C)  SpO2: 100%    Immunization History  Administered Date(s) Administered   Fluad Quad(high Dose 65+) 08/22/2020, 08/07/2022   Hepatitis A, Adult 09/12/2018   Hepatitis B 02/10/2018, 03/10/2018   Hepatitis B, ADULT 07/21/2018   Influenza Whole 07/16/2010   Influenza, High Dose Seasonal PF 09/20/2018   Influenza,inj,Quad PF,6+ Mos 08/29/2013, 09/14/2014, 11/06/2016, 09/24/2017   Influenza,inj,quad, With Preservative 08/23/2017   Influenza-Unspecified 10/25/2015, 08/08/2019, 10/17/2021, 01/01/2024   Moderna Covid-19 Vaccine Bivalent Booster 50yrs & up 10/18/2021   PFIZER(Purple Top)SARS-COV-2 Vaccination 01/08/2020, 01/29/2020, 08/25/2020   Pneumococcal Conjugate-13 09/14/2014   Pneumococcal Polysaccharide-23 09/23/2006, 08/29/2013   Td 11/27/2010, 09/12/2018   Tdap 07/21/2018   Zoster Recombinant(Shingrix) 06/18/2023, 08/27/2023   Zoster, Live 12/11/2010    There are no preventive care reminders to display for this patient.  Had a bad rash and plantar fasciitis  Doing better   Mammogram 11/2023 Self breast exam  Gyn health No problems    Colon cancer screening  colonoscopy 08/2018-per pt due for 5 y recall for polyp and she is deciding whether to do that or not   Bone health  Dexa  08/2023 osteoporosis  Taking prolia - is happy with that  Falls- none  Fractures-none  Supplements - vitamin D  Mvi with ca  Also balanced diet  Last vitamin D Lab Results  Component Value Date   VD25OH 50.46 01/10/2024    Exercise  Since a hip replacement does some lower body work   [employer] all day at work    Sees dermatology regularly   Mood    11/30/2023   11:24 AM 07/06/2023   11:38 AM 02/12/2023    2:49 PM 01/12/2023    8:23 AM 01/09/2022    8:56 AM  Depression screen PHQ 2/9  Decreased Interest 0 0 0 0 0  Down, Depressed, Hopeless 0 0 0 0 0  PHQ - 2 Score 0 0 0 0 0  Altered sleeping 0 0 0 0   Tired, decreased energy 1 0 0 0   Change in appetite 0 0 0 0   Feeling bad or failure about yourself  0 0 0 0   Trouble concentrating 0 0 0 0   Moving slowly or fidgety/restless 0 0 0 0   Suicidal thoughts 0 0 0 0   PHQ-9 Score 1 0 0 0   Difficult doing work/chores Not difficult at all Not difficult at all Not difficult at all Not difficult at all     History of fatty liver Lab Results  Component Value Date   ALT 13 01/10/2024   AST 16 01/10/2024   ALKPHOS 100 01/10/2024   BILITOT 0.4 01/10/2024    Hypothyroidism  Pt has no clinical changes No change in energy level/ hair or skin/ edema and no tremor Lab Results  Component Value Date   TSH 3.67 01/10/2024      Levothyroxine 100 mcg daily   B12 def Lab Results  Component Value Date  [id number] 716 01/10/2024   Glucose Lab Results  Component Value Date   HGBA1C 5.8 01/10/2024   HGBA1C 5.8 01/05/2023   HGBA1C 5.6 01/07/2022   In prediabetes range    Hyperlipidemia Lab Results  Component Value Date   CHOL 253 (H) 01/10/2024   CHOL 167 04/30/2023   CHOL 242 (H) 01/05/2023   Lab Results  Component Value Date   HDL 53.80 01/10/2024   HDL 57.90 04/30/2023   HDL 60.40 01/05/2023   Lab Results  Component Value Date   LDLCALC 164 (H) 01/10/2024   LDLCALC 88 04/30/2023   LDLCALC 164 (H) 01/05/2023   Lab Results  Component Value Date   TRIG 174.0 (H) 01/10/2024   TRIG 107.0 04/30/2023   TRIG 90.0 01/05/2023   Lab Results  Component Value Date   CHOLHDL 5 01/10/2024   CHOLHDL 3 04/30/2023   CHOLHDL 4 01/05/2023   Lab Results  Component Value Date   LDLDIRECT 146.3 08/22/2013    LDLDIRECT 128.6 04/19/2012   Did not tolerate rosuvastatin or atorvastatin  Muscle pain with it  Also did not tolerate zetia  Did go to cardiology -had cardiac ca score   Discussed PCYK9   Diet is better    Is exposed to the public  Would like to update her pna vaccine   Patient Active Problem List   Diagnosis Date Noted   Fatigue 04/20/2023   Aortic atherosclerosis (HCC) 02/12/2023   Agatston coronary artery calcium score less than 100 02/12/2023   GERD (gastroesophageal reflux disease) 01/12/2023   Hyperlipidemia 01/01/2020   History of cold sores 01/01/2020   Loose stools 04/14/2019   External hemorrhoids 04/14/2019   Prediabetes 04/14/2019   Screening mammogram, encounter for 12/26/2018   Fatty liver 12/26/2018   Colon cancer screening 12/03/2017   B12 deficiency 12/01/2016   Routine general medical examination at a health care facility 11/20/2015   Encounter for screening mammogram for breast cancer 12/22/2011   Allergic rhinitis 12/14/2008   Vitamin D deficiency 08/23/2008   Hypothyroid 07/26/2008   Osteoporosis 07/23/2008   Past Medical History:  Diagnosis Date   Allergy    allergic rhinitis   B12 deficiency    Back pain    disc disease lumbar   Colon polyp    Dyspepsia    Fatty liver    GERD (gastroesophageal reflux disease)    Graves' disease    Hyperlipidemia    Hyperthyroidism    Grave's disease (tx with radioactive I)   Hypothyroidism    Osteopenia    Vitamin D deficiency    Yeast vaginitis    recurrent- keeps terazol on hand / otc does not work   Past Surgical History:  Procedure Laterality Date   BREAST SURGERY     breast reduction   CARPAL TUNNEL RELEASE     CATARACT EXTRACTION, BILATERAL     CESAREAN SECTION     x 2   chin implant     COLONOSCOPY     COLONOSCOPY WITH PROPOFOL N/A 04/25/2018   Procedure: COLONOSCOPY WITH PROPOFOL;  Surgeon: Scot Jun, MD;  Location: [facility] ENDOSCOPY;  Service: Endoscopy;  Laterality: N/A;    ESOPHAGOGASTRODUODENOSCOPY (EGD) WITH PROPOFOL N/A 04/25/2018   Procedure: ESOPHAGOGASTRODUODENOSCOPY (EGD) WITH PROPOFOL;  Surgeon: Scot Jun, MD;  Location[facility] ENDOSCOPY;  Service: Endoscopy;  Laterality: N/A;   EYE SURGERY     x3 for strabismus   REDUCTION MAMMAPLASTY Bilateral 1983   Social History   Tobacco Use  Smoking status: Former    Current packs/day: 0.00    Types: Cigarettes    Quit date: 11/23/1981    Years since quitting: 42.1   Smokeless tobacco: Never  Vaping Use   Vaping status: Never Used  Substance Use Topics   Alcohol use: Not Currently    Comment: rare   Drug use: No   Family History  Problem Relation Age of Onset   Osteoporosis Mother    Heart disease Mother        mild heart problem   Arthritis Father        RA   Breast cancer Cousin    Allergies  Allergen Reactions   Boniva [Ibandronate]     Leg cramping    Zetia [Ezetimibe]     Joint pain    Meloxicam Rash   Current Outpatient Medications on File Prior to Visit  Medication Sig Dispense Refill   ascorbic acid (VITAMIN C) 500 MG tablet Take 500 mg by mouth 3 (three) times a week.     Cholecalciferol (VITAMIN D3) 5000 UNITS CAPS Take 1 capsule by mouth daily.     denosumab (PROLIA) 60 MG/ML SOSY injection Inject 60 mg into the skin every 6 (six) months. 1 mL 0   esomeprazole (NEXIUM) 20 MG capsule Take 20 mg by mouth daily as needed.     famotidine (PEPCID) 20 MG tablet Take 20 mg by mouth as needed for heartburn or indigestion.     Fluticasone Propionate (FLONASE NA) Place into the nose as needed.     hydrOXYzine (VISTARIL) 25 MG capsule Take 1 capsule (25 mg total) by mouth every 8 (eight) hours as needed for itching (severe itching). Caution of sedation 30 capsule 1   levothyroxine (SYNTHROID) 100 MCG tablet Take 100 mcg by mouth daily. 100 mg 6 x a week and 1/2 tablet on Sunday     Magnesium Oxide 250 MG TABS Take 1 tablet by mouth daily as needed.     Multiple Vitamin (MULTIVITAMIN)  capsule Take 1 capsule by mouth daily.     Multiple Vitamins-Minerals (PRESERVISION AREDS 2) CAPS Take 2 Capfuls by mouth daily.     Omega-3 Fatty Acids (FISH OIL PO) Take 1,500 mg by mouth daily.     Probiotic Product (ALIGN PO) Take 1 capsule by mouth daily.     valACYclovir (VALTREX) 500 MG tablet START VALYCYCLOVIR 500MG  TWICE A DAY 24 HOURS PRIOR TO PROCEDURE AND CONTINUE TWICE DAILY X 5 DAYS TOTAL. FOR OUTBREAKS, TAKE 4 BY MOUTH AT ONSET OF SYMPTOMS AND 4 PO 12 HOURS LATER. 30 tablet 11   Current Facility-Administered Medications on File Prior to Visit  Medication Dose Route Frequency Provider Last Rate Last Admin   [START ON 04/03/2024] denosumab (PROLIA) injection 60 mg  60 mg Subcutaneous Once Syniah Berne A, MD        Review of Systems  Constitutional:  Negative for activity change, appetite change, fatigue, fever and unexpected weight change.  HENT:  Negative for congestion, ear pain, rhinorrhea, sinus pressure and sore throat.   Eyes:  Negative for pain, redness and visual disturbance.  Respiratory:  Negative for cough, shortness of breath and wheezing.   Cardiovascular:  Negative for chest pain and palpitations.  Gastrointestinal:  Negative for abdominal pain, blood in stool, constipation and diarrhea.  Endocrine: Negative for polydipsia and polyuria.  Genitourinary:  Negative for dysuria, frequency and urgency.  Musculoskeletal:  Negative for arthralgias, back pain and myalgias.  Did well with hip replacement   Skin:  Negative for pallor and rash.  Allergic/Immunologic: Negative for environmental allergies.  Neurological:  Negative for dizziness, syncope and headaches.  Hematological:  Negative for adenopathy. Does not bruise/bleed easily.  Psychiatric/Behavioral:  Negative for decreased concentration and dysphoric mood. The patient is not nervous/anxious.        Objective:   Physical Exam Constitutional:      General: She is not in acute distress.    Appearance:  Normal appearance. She is well-developed and normal weight. She is not ill-appearing or diaphoretic.  HENT:     Head: Normocephalic and atraumatic.     Right Ear: Tympanic membrane, ear canal and external ear normal.     Left Ear: Tympanic membrane, ear canal and external ear normal.     Nose: Nose normal. No congestion.     Mouth/Throat:     Mouth: Mucous membranes are moist.     Pharynx: Oropharynx is clear. No posterior oropharyngeal erythema.  Eyes:     General: No scleral icterus.    Extraocular Movements: Extraocular movements intact.     Conjunctiva/sclera: Conjunctivae normal.     Pupils: Pupils are equal, round, and reactive to light.  Neck:     Thyroid: No thyromegaly.     Vascular: No carotid bruit or JVD.  Cardiovascular:     Rate and Rhythm: Normal rate and regular rhythm.     Pulses: Normal pulses.     Heart sounds: Normal heart sounds.     No gallop.  Pulmonary:     Effort: Pulmonary effort is normal. No respiratory distress.     Breath sounds: Normal breath sounds. No wheezing.     Comments: Good air exch Chest:     Chest wall: No tenderness.  Abdominal:     General: Bowel sounds are normal. There is no distension or abdominal bruit.     Palpations: Abdomen is soft. There is no mass.     Tenderness: There is no abdominal tenderness.     Hernia: No hernia is present.  Genitourinary:    Comments: Breast exam: No mass, nodules, thickening, tenderness, bulging, retraction, inflamation, nipple discharge or skin changes noted.  No axillary or clavicular LA.     Musculoskeletal:        General: No tenderness. Normal range of motion.     Cervical back: Normal range of motion and neck supple. No rigidity. No muscular tenderness.     Right lower leg: No edema.     Left lower leg: No edema.     Comments: No kyphosis   Lymphadenopathy:     Cervical: No cervical adenopathy.  Skin:    General: Skin is warm and dry.     Coloration: Skin is not pale.     Findings: No  erythema or rash.     Comments: Solar lentigines diffusely Fair  Few sk  Neurological:     Mental Status: She is alert. Mental status is at baseline.     Cranial Nerves: No cranial nerve deficit.     Motor: No abnormal muscle tone.     Coordination: Coordination normal.     Gait: Gait normal.     Deep Tendon Reflexes: Reflexes are normal and symmetric. Reflexes normal.  Psychiatric:        Mood and Affect: Mood normal.        Cognition and Memory: Cognition and memory normal.           Assessment &  Plan:   Problem List Items Addressed This Visit       Cardiovascular and Mediastinum   External hemorrhoids   Refilled anusol hc for prn use  Instructed not to use long term   Magnesium gives her some diarrhea which worsens irritation       Aortic atherosclerosis (HCC)   No symptoms  Good blood pressure  Cholesterol is high-pharm referral done (intol to statins and zetia)         Digestive   GERD (gastroesophageal reflux disease)   Continues GI care Ppi and pepcid Has osteoporosis -taking ca and D       Fatty liver   Normal labs  Lab Results  Component Value Date   ALT 13 01/10/2024   AST 16 01/10/2024   ALKPHOS 100 01/10/2024   BILITOT 0.4 01/10/2024   Good diet         Endocrine   Hypothyroid   Hypothyroidism  Pt has no clinical changes No change in energy level/ hair or skin/ edema and no tremor Lab Results  Component Value Date   TSH 3.67 01/10/2024    Continues levothyroxine 100 mcg daily         Musculoskeletal and Integument   Osteoporosis   Dexa 08/2023  Taking prolia  Discussed fall prevention, supplements and exercise for bone density          Other   Vitamin D deficiency   Last vitamin D Lab Results  Component Value Date   VD25OH 50.46 01/10/2024   Vitamin D level is therapeutic with current supplementation Disc importance of this to bone and overall health       Routine general medical examination at a health care  facility - Primary   Reviewed health habits including diet and exercise and skin cancer prevention Reviewed appropriate screening tests for age  Also reviewed health mt list, fam hx and immunization status , as well as social and family history   See HPI Labs reviewed and ordered Health Maintenance  Topic Date Due   COVID-19 Vaccine (5 - 2024-25 season) 12/15/2024*   Mammogram  12/20/2024   Medicare Annual Wellness Visit  01/16/2025   Colon Cancer Screening  04/25/2028   DTaP/Tdap/Td vaccine (4 - Td or Tdap) 09/12/2028   Pneumonia Vaccine  Completed   Flu Shot  Completed   DEXA scan (bone density measurement)  Completed   Hepatitis C Screening  Completed   Zoster (Shingles) Vaccine  Completed   HPV Vaccine  Aged Out  *Topic was postponed. The date shown is not the original due date.   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 Consider pcyk9 in future  Had cardiac ca  score that was not high thankfully      Relevant Orders   AMB Referral [facility] Care Management   Colon cancer screening   Colonoscopy 08/2018  Per pt -polyp with 5 y recall  She is deciding if she wants to do it  Discussed pros and cons        B12 deficiency   Lab Results  Component Value Date   VITAMINB12 716 01/10/2024   Continue oral  supplementation       Agatston coronary artery calcium score less than 100   Cholesterol is up off statin  Ref made to pharm

## 2024-01-17 NOTE — Assessment & Plan Note (Signed)
 Disc goals for lipids and reasons to control them Rev last labs with pt Rev low sat fat diet in detail   Pt is intolerant of crestor and lipitor even intermittent dosing Also zetia   Referral made to pharm  ? Consider pcyk9 in future  Had cardiac ca score that was not high thankfully

## 2024-01-17 NOTE — Assessment & Plan Note (Signed)
 Refilled anusol hc for prn use  Instructed not to use long term   Magnesium gives her some diarrhea which worsens irritation

## 2024-01-17 NOTE — Assessment & Plan Note (Signed)
 Cholesterol is up off statin  Ref made to pharm

## 2024-01-17 NOTE — Assessment & Plan Note (Signed)
 Lab Results  Component Value Date   HGBA1C 5.8 01/10/2024   HGBA1C 5.8 01/05/2023   HGBA1C 5.6 01/07/2022   disc imp of low glycemic diet and wt loss to prevent DM2

## 2024-01-17 NOTE — Assessment & Plan Note (Signed)
 Continues GI care Ppi and pepcid Has osteoporosis -taking ca and D

## 2024-01-17 NOTE — Assessment & Plan Note (Signed)
 Colonoscopy 08/2018  Per pt -polyp with 5 y recall  She is deciding if she wants to do it  Discussed pros and cons

## 2024-01-17 NOTE — Assessment & Plan Note (Signed)
 Last vitamin D Lab Results  Component Value Date   VD25OH 50.46 01/10/2024   Vitamin D level is therapeutic with current supplementation Disc importance of this to bone and overall health

## 2024-01-17 NOTE — Assessment & Plan Note (Signed)
 No symptoms  Good blood pressure  Cholesterol is high-pharm referral done (intol to statins and zetia)

## 2024-01-17 NOTE — Assessment & Plan Note (Signed)
 Dexa 08/2023  Taking prolia  Discussed fall prevention, supplements and exercise for bone density

## 2024-01-17 NOTE — Assessment & Plan Note (Signed)
 Lab Results  Component Value Date   VITAMINB12 716 01/10/2024   Continue oral supplementation

## 2024-01-18 DIAGNOSIS — H0100A Unspecified blepharitis right eye, upper and lower eyelids: Secondary | ICD-10-CM | POA: Diagnosis not present

## 2024-01-18 DIAGNOSIS — H524 Presbyopia: Secondary | ICD-10-CM | POA: Diagnosis not present

## 2024-01-18 DIAGNOSIS — H04123 Dry eye syndrome of bilateral lacrimal glands: Secondary | ICD-10-CM | POA: Diagnosis not present

## 2024-01-18 DIAGNOSIS — H52203 Unspecified astigmatism, bilateral: Secondary | ICD-10-CM | POA: Diagnosis not present

## 2024-01-18 DIAGNOSIS — H01004 Unspecified blepharitis left upper eyelid: Secondary | ICD-10-CM | POA: Diagnosis not present

## 2024-01-18 DIAGNOSIS — H353132 Nonexudative age-related macular degeneration, bilateral, intermediate dry stage: Secondary | ICD-10-CM | POA: Diagnosis not present

## 2024-01-18 DIAGNOSIS — H43813 Vitreous degeneration, bilateral: Secondary | ICD-10-CM | POA: Diagnosis not present

## 2024-01-21 ENCOUNTER — Telehealth: Payer: Self-pay

## 2024-01-21 NOTE — Progress Notes (Signed)
 Care Guide Pharmacy Note  01/21/2024 Name: [name] MRN: [id number] DOB: 31-Dec-1945  Referred By: Judy Pimple, MD Reason for referral: Care Coordination (Outreach to schedule with Pharm d )   Angela Bell is a 78 y.o. year old female who is a primary care patient of Tower, Audrie Gallus, MD.  [name] was referred to the pharmacist for assistance related to: HLD  Successful contact was made with the patient to discuss pharmacy services including being ready for the pharmacist to call at least 5 minutes before the scheduled appointment time and to have medication bottles and any blood pressure readings ready for review. The patient agreed to meet with the pharmacist via telephone visit on (date/time).01/26/2024  Penne Lash , RMA     [place]  [facility], [facility] Guide  Direct Dial: [phone]  Website: [email or web address]

## 2024-01-26 ENCOUNTER — Other Ambulatory Visit (INDEPENDENT_AMBULATORY_CARE_PROVIDER_SITE_OTHER): Payer: PPO

## 2024-01-26 DIAGNOSIS — E782 Mixed hyperlipidemia: Secondary | ICD-10-CM

## 2024-01-26 DIAGNOSIS — I700 Atherosclerosis of aorta: Secondary | ICD-10-CM

## 2024-01-26 MED ORDER — REPATHA SURECLICK 140 MG/ML ~~LOC~~ SOAJ: 140.0000 mg | SUBCUTANEOUS | 3 refills | Status: DC

## 2024-01-26 NOTE — Progress Notes (Unsigned)
 01/26/2024 Name: Angela Bell MRN: [id number] DOB: May 11, 1946  Subjective  Chief Complaint  Patient presents with   Hyperlipidemia   Reason for visit: ?  Angela Bell is a 78 y.o. female who presents today for an initial hyperlipidemia/ASCVD risk-rediction pharmacotherapy visit.? Pertinent PMH includes Aortic atherosclerosis, FLD, GERD, Hypothyroid, Osteoporosis, Pre-diabetes, HLD.  Care Team: Primary Care Provider: Judy Pimple, MD   Date of Last Visit: with PCP on 01/17/24   Recent Summary of Change: 2/17: LDL 164. Did not tolerate statins or zetia. Diet is better   Medication Access/Adherence: Prescription drug coverage: Payor: [employer] / Plan: [employer] PPO / Product Type: *No Product type* / .  Reports that all medications are affordable.  Current Patient Assistance: None Medication Adherence: Patient denies missing doses of their medication.    History of Present Illness: ?   Reported Lipid Regimen: ? N/A   Lipid-lowering medications tried in the past:?  Atorvastatin 10 mg daily (Prescribed 01/13/23, could not tolerate due to persistent muscle pains. Atorvastatin 5 mg daily (Muscle pains persisted despite dose reduction) Atorvastatin 5 mg three days per week  Ezetimibe (Prescribed 01/09/22 - muscle/joint pain in lower extremity, persistent despite reduction to 5 mg daily.) Rosuvastatin 5 mg daily (Prescribed 01/13/20 - Took for ~5 weeks though could not tolerate persistent muscle aches. Reduced dose to 5 mg daily though muscle aches persisted. Reduced dose to 3 times per week dosing. Dinally stopping taking and muscle symptoms resolved.)  Reported Diet: Has been watching her diet closely since she stopped taking cholesterol medication. Feels she has a good baseline understanding of foods to avoid to mitigate risk of increasing her cholesterol levels.   Exercise: Less active this past year s/p hip replacement. Did go back to work right away.   Recovery has been going well, now feels significant improvement in pain/mobility.   Cardiovascular Risk Reduction History of clinical ASCVD? no The 10-year ASCVD risk score (Arnett DK, et al., 2019) is: 12.9% History of heart failure? no History of diabetes: No. Pre-Diabetes Current BMI: 24.2 kg/m2 (Ht 64 in, Wt 64 kg) Taking statin? intolerant; (Myalgia on lowest dose atorvastatin, rosuvastatin, ezetimibe) Taking aspirin? not indicated; Not taking   Taking SGLT-2i? no Taking GLP- 1 RA? no     _______________________________________________  Objective    Review of Systems:? Limited in the setting of virtual visit    Physical Examination:  Vitals:  Wt Readings from Last 3 Encounters:  01/17/24 141 lb 2 oz (64 kg)  11/30/23 146 lb 2 oz (66.3 kg)  07/06/23 146 lb 2 oz (66.3 kg)   BP Readings from Last 3 Encounters:  01/17/24 112/68  11/30/23 122/70  07/06/23 116/68   Pulse Readings from Last 3 Encounters:  01/17/24 98  11/30/23 72  07/06/23 74     Labs:?  Lab Results  Component Value Date   CKTOTAL 42 07/06/2023   Coronary Calcium Score (03/31/22): 25.5 1. This was 47th percentile for age and sex matched control   Lab Results  Component Value Date   CHOL 253 (H) 01/10/2024   LDLCALC 164 (H) 01/10/2024   LDLCALC 88 04/30/2023   LDLCALC 164 (H) 01/05/2023   LDLDIRECT 146.3 08/22/2013   LDLDIRECT 128.6 04/19/2012   HDL 53.80 01/10/2024   TRIG 174.0 (H) 01/10/2024   TRIG 107.0 04/30/2023   TRIG 90.0 01/05/2023   ALT 13 01/10/2024   ALT 11 07/06/2023   AST 16 01/10/2024   AST 16 07/06/2023   Lab  Results  Component Value Date   HGBA1C 5.8 01/10/2024   HGBA1C 5.8 01/05/2023   HGBA1C 5.6 01/07/2022   GLUCOSE 94 01/10/2024   CREATININE 0.73 01/10/2024   CREATININE 0.81 09/22/2023   CREATININE 0.71 07/06/2023   GFR 80.46 01/10/2024   GFR 71.17 09/22/2023   GFR 83.49 07/06/2023     Chemistry      Component Value Date/Time   NA 141 01/10/2024 0740   K 4.0  01/10/2024 0740   CL 106 01/10/2024 0740   CO2 27 01/10/2024 0740   BUN 13 01/10/2024 0740   CREATININE 0.73 01/10/2024 0740   CREATININE 0.88 02/12/2023 1458      Component Value Date/Time   CALCIUM 8.9 01/10/2024 0740   ALKPHOS 100 01/10/2024 0740   AST 16 01/10/2024 0740   ALT 13 01/10/2024 0740   BILITOT 0.4 01/10/2024 0740      The 10-year ASCVD risk score (Arnett DK, et al., 2019) is: 12.9%  Assessment and Plan:    #Hyperlipidemia (primary prevention):  Uncontrolled  on last lipid panel (01/10/24): TC 253 mg/dL, LDL 161 mg/dL, TG 096 mg/dL. LDL goal <100 mg/dL (primary prevention, no diabetes, low risk). Would benefit form PCSK9i per 15% reduction in MACE outcomes per FOURIER/ODYSSEY trials and favorable side effect profile. Reassuringly, Coronary calcium score was low previously (25.5). Most prominent risk factor at this point is her elevated LDL.  - Previous therapies: rosuvastatin, atorvastatin, ezetimibe (intolerance despite lowest doses and despite less than once-daily dosing).  - Key risk factors include: hyperlipidemia, aortic atherosclerosis noted on CT, CAC <100. - Fam Hx: Grandmother and mother (stroke hx, CAD - though lived to 45s).  - The 10-year ASCVD risk score (Arnett DK, et al., 2019) is: 12.9% indicated patient is at moderate risk.  Repatha Prior Authorization Submitted via CMM: (KEY: [id number]) - This request has been approved.  Cost Assistance: Reports copays are manageable at present time. Feels that a $47/month copay would be feasible for Repatha. Lives at home with 1 person [id number] Right at the income limit for HealthWell Cholesterol Kennedy Bucker which would offer further copay assistance  Prolia income limit for [employer] [phone] or less) Synthroid - Paying $100-110/month for Brand name. Over income Limit for [employer] (62,600 or less)  Follow Up Patient given direct line for questions regarding medication therapy and MAP applications  Future  Appointments  Date Time Provider Department Center  12/12/2024  4:00 PM Elie Goody, MD [facility] None    Loree Fee, PharmD Clinical Pharmacist [facility] [phone]    I have personally reviewed this encounter including the documentation in this note and have collaborated with the care management provider regarding care management and care coordination activities to include development and update of the comprehensive care plan. I am certifying that I agree with the content of this note and encounter as supervising physician.  Roxy Manns MD

## 2024-01-26 NOTE — Patient Instructions (Addendum)
 Ms. Angela Bell,   It was a pleasure to speak with you today! As we discussed:?   The injection cholesterol medication is called Repatha (evolocumab).   Insurance Coverage A prior authorization has been submitted to your insurance for Repatha and at this time has been approved based on your previous trial of multiple statin mediations and ezetimibe.   Through [employer], a preferred brand medication should have a copay of around $47 per month at a preferred pharmacy.   2.   Additional Cost Savings We briefly discussed a grant that is open for Medicare patients to receive cost savings on their cholesterol medications (through the [employer]). The criteria for the grant include an annual income below 500% of the federal poverty limit.    Your estimated income is very very close to this 500% limit. For a 1-person household, 500% is around 78,400/year. I have started your enrollment for the grant. This program approves Medicare patients right away though they will softly verify income. They can at this point choose to terminate your enrollment in the grant or request clarification of income.  Regardless, your insurance should be covering the Repatha for $47/month. The grant would essentially pay this copay for you. If denied for the grant, you would continue paying $47/month.   You have been enrolled (conditionally, pending income verification) in the HealthWell Foundation Hyperlipidemia Grant. This grant helps Medicare patients pay for their cholesterol medications at the pharmacy.   Your HealthWell ID: [id number]  HealthWell Foundation: Website: https://www.healthwellfoundation.org/fund/hypercholesterolemia-medicare-access/ Phone: [phone] M-F 9am-5pm   This program works similar to a Biochemist, clinical card or insurance card. Your pharmacy will continue to bill your cholesterol medicine through Medicare, then they will bill the HealthWell savings card to cover  your copay.   Your HealthWell savings card is electronic (not a physical card).  Please provide the following details to your pharmacist:   Your HealthWell Savings Card Numbers:            Rx Card: Card No.  [id number] RX BIN:  [id number] PCN:  [id number] Group:  [id number]     You may respond directly to this message, or leave me a voicemail at [phone] and I will get back to you shortly.   Loree Fee, PharmD Clinical Pharmacist [facility] [phone]   Medication & Administration    Dosage: Inject the contents of 1 syringe (140mg ) under the skin every 2 weeks.   Administration: Administer under the skin of the abdomen, thigh or upper arm. Rotate sites with each injection. Injection instructions - Autoinjector: Remove 1 Repatha autoinjector from the refrigerator and let stand at room temperature for at least 30 minutes. Check the autoinjector for the following: Expiration date Absence of any cracks or damage The medicine is clear and colorless and does not contain any particles The orange cap is present and securely attached Choose your injection site and clean with an alcohol wipe. Allow to air dry completely. Pull the orange cap straight off and discard Pinch the skin (or stretch) with your thumb and fingers creating an area 2 inches wide Maintaining the pinch (or stretch) press the pen to your skin at a 90 degree angle. Firmly push the autoinjector down until the skin stops moving and the yellow safety guard is no longer visible. Do not touch the gray start button yet When you are ready to inject, press the gray start button. You will hear a "click" that signals the start of the injection Continue to  press the pen to your skin and lift your thumb The injection may take up to 15 seconds. You will know the injection is complete when the medication window turns yellow. You may also hear a second "click." Remove the pen from your skin and discard the pen in a  sharps container. If there is blood at the injection site, press a cotton ball or gauze to the site. Do not rub the injection site. Adherence/Missed dose instructions: Administer a missed dose within 7 days and resume your normal schedule.  If it has been more than 7 days and you inject every 2 weeks, skip the missed dose and resume your normal schedule..    Goals of Therapy    Lower cholesterol, prevention of cardiovascular events in patients with established cardiovascular disease   Storage, Handling Precautions, & Disposal  Repatha should be stored in the refrigerator. If necessary, Repatha may be kept at room temperature for no more than 30 days. Place used devices in a sharps container for disposal.

## 2024-02-08 ENCOUNTER — Encounter: Payer: Self-pay | Admitting: Family Medicine

## 2024-02-08 DIAGNOSIS — K760 Fatty (change of) liver, not elsewhere classified: Secondary | ICD-10-CM | POA: Diagnosis not present

## 2024-02-08 DIAGNOSIS — K649 Unspecified hemorrhoids: Secondary | ICD-10-CM | POA: Diagnosis not present

## 2024-02-08 DIAGNOSIS — Z860101 Personal history of adenomatous and serrated colon polyps: Secondary | ICD-10-CM | POA: Diagnosis not present

## 2024-02-08 DIAGNOSIS — K219 Gastro-esophageal reflux disease without esophagitis: Secondary | ICD-10-CM | POA: Diagnosis not present

## 2024-02-08 NOTE — Telephone Encounter (Signed)
 I don't think repatha is a problem    Needs to come if for shingles with first available

## 2024-02-08 NOTE — Telephone Encounter (Signed)
 Contacted patient she states she thinks it is getting better and will decline making apt at this time. Would reach out if it gets worse.

## 2024-02-09 DIAGNOSIS — H43812 Vitreous degeneration, left eye: Secondary | ICD-10-CM | POA: Diagnosis not present

## 2024-02-09 DIAGNOSIS — H35372 Puckering of macula, left eye: Secondary | ICD-10-CM | POA: Diagnosis not present

## 2024-02-09 DIAGNOSIS — H353132 Nonexudative age-related macular degeneration, bilateral, intermediate dry stage: Secondary | ICD-10-CM | POA: Diagnosis not present

## 2024-02-14 DIAGNOSIS — E890 Postprocedural hypothyroidism: Secondary | ICD-10-CM | POA: Diagnosis not present

## 2024-02-17 DIAGNOSIS — E890 Postprocedural hypothyroidism: Secondary | ICD-10-CM | POA: Diagnosis not present

## 2024-02-22 DIAGNOSIS — M722 Plantar fascial fibromatosis: Secondary | ICD-10-CM | POA: Diagnosis not present

## 2024-03-07 ENCOUNTER — Encounter: Payer: Self-pay | Admitting: Family Medicine

## 2024-03-07 DIAGNOSIS — M810 Age-related osteoporosis without current pathological fracture: Secondary | ICD-10-CM

## 2024-03-07 NOTE — Telephone Encounter (Signed)
 Will route to Angela Bell who is over our [place] and [place]

## 2024-03-09 ENCOUNTER — Other Ambulatory Visit: Payer: Self-pay

## 2024-03-09 DIAGNOSIS — Z96641 Presence of right artificial hip joint: Secondary | ICD-10-CM | POA: Diagnosis not present

## 2024-03-13 ENCOUNTER — Other Ambulatory Visit: Payer: Self-pay | Admitting: Family Medicine

## 2024-03-13 ENCOUNTER — Other Ambulatory Visit: Payer: Self-pay

## 2024-03-14 NOTE — Telephone Encounter (Signed)
 Routing to Angela Bell

## 2024-03-14 NOTE — Telephone Encounter (Signed)
Will route to [facility] for review

## 2024-03-15 ENCOUNTER — Other Ambulatory Visit: Payer: Self-pay | Admitting: Family Medicine

## 2024-03-15 ENCOUNTER — Other Ambulatory Visit (HOSPITAL_COMMUNITY): Payer: Self-pay

## 2024-03-15 DIAGNOSIS — E782 Mixed hyperlipidemia: Secondary | ICD-10-CM

## 2024-03-15 DIAGNOSIS — I700 Atherosclerosis of aorta: Secondary | ICD-10-CM

## 2024-03-15 MED ORDER — DENOSUMAB 60 MG/ML ~~LOC~~ SOSY: 60.0000 mg | PREFILLED_SYRINGE | SUBCUTANEOUS | Status: AC

## 2024-03-15 NOTE — Telephone Encounter (Signed)
 Prolia  referral has been placed. The one from November was closed in January 2025.

## 2024-03-15 NOTE — Addendum Note (Signed)
 Addended by: Dewanda Foots C on: 03/15/2024 10:05 AM   Modules accepted: Orders

## 2024-03-16 ENCOUNTER — Other Ambulatory Visit: Payer: Self-pay

## 2024-03-16 ENCOUNTER — Other Ambulatory Visit: Payer: Self-pay | Admitting: *Deleted

## 2024-03-16 ENCOUNTER — Other Ambulatory Visit (HOSPITAL_COMMUNITY): Payer: Self-pay

## 2024-03-16 DIAGNOSIS — M810 Age-related osteoporosis without current pathological fracture: Secondary | ICD-10-CM

## 2024-03-16 MED ORDER — DENOSUMAB 60 MG/ML ~~LOC~~ SOSY
60.0000 mg | PREFILLED_SYRINGE | Freq: Once | SUBCUTANEOUS | 0 refills | Status: AC
  Filled 2024-03-16 – 2024-03-17 (×2): qty 1, 180d supply, fill #0

## 2024-03-17 ENCOUNTER — Other Ambulatory Visit: Payer: Self-pay

## 2024-03-17 ENCOUNTER — Other Ambulatory Visit (HOSPITAL_COMMUNITY): Payer: Self-pay

## 2024-03-17 ENCOUNTER — Other Ambulatory Visit (INDEPENDENT_AMBULATORY_CARE_PROVIDER_SITE_OTHER)

## 2024-03-17 DIAGNOSIS — M810 Age-related osteoporosis without current pathological fracture: Secondary | ICD-10-CM

## 2024-03-17 LAB — BASIC METABOLIC PANEL WITH GFR
BUN: 15 mg/dL (ref 6–23)
CO2: 29 meq/L (ref 19–32)
Calcium: 9.1 mg/dL (ref 8.4–10.5)
Chloride: 104 meq/L (ref 96–112)
Creatinine, Ser: 0.77 mg/dL (ref 0.40–1.20)
GFR: 75.38 mL/min (ref >60.00)
Glucose, Bld: 89 mg/dL (ref 70–99)
Potassium: 4 meq/L (ref 3.5–5.1)
Sodium: 140 meq/L (ref 135–145)

## 2024-03-17 NOTE — Progress Notes (Signed)
 Specialty Pharmacy Initial Fill Coordination Note  Angela Bell is a 78 y.o. female contacted today regarding initial fill of specialty medication(s) Denosumab  (PROLIA )   Patient requested Courier to Provider Office   Delivery date: 03/22/24   Verified address: [facility]   Medication will be filled on 4/29.   Patient is aware of $250 copayment.

## 2024-03-20 ENCOUNTER — Other Ambulatory Visit: Payer: Self-pay | Admitting: Family Medicine

## 2024-03-20 DIAGNOSIS — I700 Atherosclerosis of aorta: Secondary | ICD-10-CM

## 2024-03-20 DIAGNOSIS — E782 Mixed hyperlipidemia: Secondary | ICD-10-CM

## 2024-03-20 NOTE — Telephone Encounter (Signed)
 Copied from CRM [id number]. Topic: Clinical - Medication Refill >> Mar 20, 2024  2:33 PM Elita Guitar wrote: Most Recent Primary Care Visit:  Provider: Daron Ellen  Department: [facility]  Visit Type: PATIENT OUTREACH 60  Date: 01/26/2024  Medication: Repatha   Has the patient contacted their pharmacy? Yes They told her to call the office  Is this the correct pharmacy for this prescription? Yes If no, delete pharmacy and type the correct one.  This is the patient's preferred pharmacy:  [id number] [place] [facility] - [address] [place] [address] Phone: [phone] Fax: [phone]   Has the prescription been filled recently? Yes  Is the patient out of the medication? Yes  Has the patient been seen for an appointment in the last year OR does the patient have an upcoming appointment? Yes  Can we respond through MyChart? No  Agent: Please be advised that Rx refills may take up to 3 business days. We ask that you follow-up with your pharmacy.

## 2024-03-21 ENCOUNTER — Other Ambulatory Visit: Payer: Self-pay

## 2024-03-22 DIAGNOSIS — M722 Plantar fascial fibromatosis: Secondary | ICD-10-CM | POA: Diagnosis not present

## 2024-03-22 DIAGNOSIS — Z471 Aftercare following joint replacement surgery: Secondary | ICD-10-CM | POA: Diagnosis not present

## 2024-03-28 ENCOUNTER — Ambulatory Visit

## 2024-04-04 ENCOUNTER — Ambulatory Visit (INDEPENDENT_AMBULATORY_CARE_PROVIDER_SITE_OTHER)

## 2024-04-04 DIAGNOSIS — M810 Age-related osteoporosis without current pathological fracture: Secondary | ICD-10-CM | POA: Diagnosis not present

## 2024-04-04 MED ORDER — DENOSUMAB 60 MG/ML ~~LOC~~ SOSY: 60.0000 mg | PREFILLED_SYRINGE | Freq: Once | SUBCUTANEOUS | Status: AC

## 2024-04-04 MED ORDER — DENOSUMAB 60 MG/ML ~~LOC~~ SOSY
60.0000 mg | PREFILLED_SYRINGE | Freq: Once | SUBCUTANEOUS | Status: AC
Start: 2024-04-04 — End: 2024-04-04
  Administered 2024-04-04: 60 mg via SUBCUTANEOUS

## 2024-04-04 NOTE — Progress Notes (Signed)
Per orders of Dr. Roxy Manns, injection of prolia 60 mg given by Lewanda Rife in right arm.. Patient tolerated injection well. Patient will make appointment for 6 month.

## 2024-05-02 ENCOUNTER — Other Ambulatory Visit: Payer: Self-pay | Admitting: Family Medicine

## 2024-05-02 DIAGNOSIS — E782 Mixed hyperlipidemia: Secondary | ICD-10-CM

## 2024-05-02 DIAGNOSIS — I700 Atherosclerosis of aorta: Secondary | ICD-10-CM

## 2024-05-02 NOTE — Telephone Encounter (Signed)
 I sent 3 mo supply  Has she had labs since starting this ? If not needs fasting labs   Will put orders in

## 2024-05-02 NOTE — Telephone Encounter (Signed)
 Last filled on 01/26/24 #2 mL/ 3 refills  CPE was on 01/17/24

## 2024-05-04 ENCOUNTER — Telehealth: Payer: Self-pay | Admitting: Family Medicine

## 2024-05-04 DIAGNOSIS — E782 Mixed hyperlipidemia: Secondary | ICD-10-CM

## 2024-05-04 NOTE — Telephone Encounter (Signed)
 For labs tomorrow

## 2024-05-04 NOTE — Telephone Encounter (Signed)
 Noted

## 2024-05-04 NOTE — Telephone Encounter (Signed)
 Spoke with Angela Bell relaying Dr Belva Boyden message and scheduled fasting lab visit tomorrow at 8:15.   Angela Bell asks if her kidneys can also be checked, since she read Repatha  can affect the kidneys.

## 2024-05-05 ENCOUNTER — Other Ambulatory Visit (INDEPENDENT_AMBULATORY_CARE_PROVIDER_SITE_OTHER)

## 2024-05-05 DIAGNOSIS — E782 Mixed hyperlipidemia: Secondary | ICD-10-CM

## 2024-05-06 ENCOUNTER — Ambulatory Visit: Payer: Self-pay | Admitting: Family Medicine

## 2024-05-06 LAB — COMPREHENSIVE METABOLIC PANEL WITH GFR
AG Ratio: 1.7 (calc) (ref 1.0–2.5)
ALT: 13 U/L (ref 6–29)
AST: 16 U/L (ref 10–35)
Albumin: 4 g/dL (ref 3.6–5.1)
Alkaline phosphatase (APISO): 97 U/L (ref 37–153)
BUN: 15 mg/dL (ref 7–25)
CO2: 25 mmol/L (ref 20–32)
Calcium: 8.8 mg/dL (ref 8.6–10.4)
Chloride: 106 mmol/L (ref 98–110)
Creat: 0.78 mg/dL (ref 0.60–1.00)
Globulin: 2.3 g/dL (ref 1.9–3.7)
Glucose, Bld: 85 mg/dL (ref 65–99)
Potassium: 3.7 mmol/L (ref 3.5–5.3)
Sodium: 141 mmol/L (ref 135–146)
Total Bilirubin: 0.8 mg/dL (ref 0.2–1.2)
Total Protein: 6.3 g/dL (ref 6.1–8.1)
eGFR: 79 mL/min/{1.73_m2} (ref >=60)

## 2024-05-06 LAB — LIPID PANEL
Cholesterol: 131 mg/dL (ref <200)
HDL: 63 mg/dL (ref >=50)
LDL Cholesterol (Calc): 50 mg/dL
Non-HDL Cholesterol (Calc): 68 mg/dL (ref <130)
Total CHOL/HDL Ratio: 2.1 (calc) (ref <5.0)
Triglycerides: 97 mg/dL (ref <150)

## 2024-05-11 ENCOUNTER — Ambulatory Visit: Payer: PPO | Admitting: Dermatology

## 2024-05-15 ENCOUNTER — Ambulatory Visit
Admission: EM | Admit: 2024-05-15 | Discharge: 2024-05-15 | Disposition: A | Discharge status: Home / Self Care | Attending: Emergency Medicine | Admitting: Emergency Medicine

## 2024-05-15 VITALS — BP 116/77 | HR 88 | Temp 97.2°F | Resp 16

## 2024-05-15 DIAGNOSIS — J029 Acute pharyngitis, unspecified: Secondary | ICD-10-CM

## 2024-05-15 DIAGNOSIS — B349 Viral infection, unspecified: Secondary | ICD-10-CM

## 2024-05-15 LAB — POC SARS CORONAVIRUS 2 AG -  ED: SARS Coronavirus 2 Ag: NEGATIVE

## 2024-05-15 LAB — POCT RAPID STREP A (OFFICE): Rapid Strep A Screen: NEGATIVE

## 2024-05-15 NOTE — Discharge Instructions (Addendum)
 The COVID and strep tests are negative.   Take Tylenol or ibuprofen as needed for fever or discomfort.    Follow-up with your primary care provider if your symptoms are not improving.

## 2024-05-15 NOTE — ED Provider Notes (Signed)
 Angela Bell    CSN: [id number] Arrival date & time: 05/15/24  1245      History   Chief Complaint Chief Complaint  Patient presents with   Sore Throat    HPI Angela Bell is a 78 y.o. female.  Patient presents with 2-day history of sore throat, hoarse voice, mild nonproductive cough, diarrhea, fatigue.  1 episode of diarrhea today.  She denies fever, shortness of breath, abdominal pain, vomiting.  She took Celebrex.  The history is provided by the patient and medical records.    Past Medical History:  Diagnosis Date   Allergy    allergic rhinitis   B12 deficiency    Back pain    disc disease lumbar   Colon polyp    Dyspepsia    Fatty liver    GERD (gastroesophageal reflux disease)    Graves' disease    Hyperlipidemia    Hyperthyroidism    Grave's disease (tx with radioactive I)   Hypothyroidism    Osteopenia    Vitamin D  deficiency    Yeast vaginitis    recurrent- keeps terazol on hand / otc does not work    Patient Active Problem List   Diagnosis Date Noted   Fatigue 04/20/2023   Aortic atherosclerosis (HCC) 02/12/2023   Agatston coronary artery calcium  score less than 100 02/12/2023   GERD (gastroesophageal reflux disease) 01/12/2023   Hyperlipidemia 01/01/2020   History of cold sores 01/01/2020   Loose stools 04/14/2019   External hemorrhoids 04/14/2019   Prediabetes 04/14/2019   Screening mammogram, encounter for 12/26/2018   Fatty liver 12/26/2018   Colon cancer screening 12/03/2017   B12 deficiency 12/01/2016   Routine general medical examination at a health care facility 11/20/2015   Encounter for screening mammogram for breast cancer 12/22/2011   Allergic rhinitis 12/14/2008   Vitamin D  deficiency 08/23/2008   Hypothyroid 07/26/2008   Osteoporosis 07/23/2008    Past Surgical History:  Procedure Laterality Date   BREAST SURGERY     breast reduction   CARPAL TUNNEL RELEASE     CATARACT EXTRACTION, BILATERAL     CESAREAN  SECTION     x 2   chin implant     COLONOSCOPY     COLONOSCOPY WITH PROPOFOL  N/A 04/25/2018   Procedure: COLONOSCOPY WITH PROPOFOL ;  Surgeon: Viktoria Lamar DASEN, MD;  Location[facility] ENDOSCOPY;  Service: Endoscopy;  Laterality: N/A;   ESOPHAGOGASTRODUODENOSCOPY (EGD) WITH PROPOFOL  N/A 04/25/2018   Procedure: ESOPHAGOGASTRODUODENOSCOPY (EGD) WITH PROPOFOL ;  Surgeon: Viktoria Lamar DASEN, MD;  Location: [facility] ENDOSCOPY;  Service: Endoscopy;  Laterality: N/A;   EYE SURGERY     x3 for strabismus   REDUCTION MAMMAPLASTY Bilateral 1983    OB History   No obstetric history on file.      Home Medications    Prior to Admission medications   Medication Sig Start Date End Date Taking? Authorizing Provider  ascorbic acid (VITAMIN C) 500 MG tablet Take 500 mg by mouth 3 (three) times a week.    [provider]  Cholecalciferol (VITAMIN D3) 5000 UNITS CAPS Take 1 capsule by mouth daily.    [provider]  denosumab  (PROLIA ) 60 MG/ML SOSY injection Inject 60 mg into the skin every 6 (six) months. 09/24/23   Tower, Laine LABOR, MD  esomeprazole (NEXIUM) 20 MG capsule Take 20 mg by mouth daily as needed.    [provider]  Evolocumab  (REPATHA  SURECLICK) 140 MG/ML SOAJ INJECT 140MG  INTO THE SKIN EVERY 14 DAYS  05/02/24   Tower, Laine LABOR, MD  famotidine (PEPCID) 20 MG tablet Take 20 mg by mouth as needed for heartburn or indigestion.    [provider]  Fluticasone Propionate (FLONASE NA) Place into the nose as needed.    [provider]  hydrocortisone  1 % ointment APPLY EXTERNALLY TO HEMORRHOIDS ONCE DAILY AS NEEDED 01/17/24   Tower, Laine LABOR, MD  hydrOXYzine  (VISTARIL ) 25 MG capsule Take 1 capsule (25 mg total) by mouth every 8 (eight) hours as needed for itching (severe itching). Caution of sedation 12/01/23   Tower, Laine LABOR, MD  levothyroxine  (SYNTHROID ) 100 MCG tablet Take 100 mcg by mouth daily. 100 mg 6 x a week and 1/2 tablet on Sunday 01/08/22   [provider]   Magnesium Oxide 250 MG TABS Take 1 tablet by mouth daily as needed.    [provider]  Multiple Vitamin (MULTIVITAMIN) capsule Take 1 capsule by mouth daily.    [provider]  Multiple Vitamins-Minerals (PRESERVISION AREDS 2) CAPS Take 2 Capfuls by mouth daily.    [provider]  Omega-3 Fatty Acids (FISH OIL PO) Take 1,500 mg by mouth daily.    [provider]  Probiotic Product (ALIGN PO) Take 1 capsule by mouth daily.    [provider]  valACYclovir  (VALTREX ) 500 MG tablet START VALYCYCLOVIR 500MG  TWICE A DAY 24 HOURS PRIOR TO PROCEDURE AND CONTINUE TWICE DAILY X 5 DAYS TOTAL. FOR OUTBREAKS, TAKE 4 BY MOUTH AT ONSET OF SYMPTOMS AND 4 PO 12 HOURS LATER. 06/30/23   Hester Alm BROCKS, MD    Family History Family History  Problem Relation Age of Onset   Osteoporosis Mother    Heart disease Mother        mild heart problem   Arthritis Father        RA   Breast cancer Cousin     Social History Social History   Tobacco Use   Smoking status: Former    Current packs/day: 0.00    Types: Cigarettes    Quit date: 11/23/1981    Years since quitting: 42.5   Smokeless tobacco: Never  Vaping Use   Vaping status: Never Used  Substance Use Topics   Alcohol use: Not Currently    Comment: rare   Drug use: No     Allergies   Boniva [ibandronate], Zetia  [ezetimibe ], and Meloxicam   Review of Systems Review of Systems  Constitutional:  Negative for chills and fever.  HENT:  Positive for sore throat and voice change. Negative for ear pain.   Respiratory:  Positive for cough. Negative for shortness of breath.   Gastrointestinal:  Positive for diarrhea. Negative for abdominal pain and vomiting.     Physical Exam Triage Vital Signs ED Triage Vitals  Encounter Vitals Group     BP 05/15/24 1253 116/77     Girls Systolic BP Percentile --      Girls Diastolic BP Percentile --      Boys Systolic BP Percentile --      Boys Diastolic BP  Percentile --      Pulse Rate 05/15/24 1253 88     Resp 05/15/24 1253 16     Temp 05/15/24 1253 (!) 97.2 F (36.2 C)     Temp src --      SpO2 05/15/24 1253 96 %     Weight --      Height --      Head Circumference --  Peak Flow --      Pain Score 05/15/24 1252 8     Pain Loc --      Pain Education --      Exclude from Growth Chart --    No data found.  Updated Vital Signs BP 116/77   Pulse 88   Temp (!) 97.2 F (36.2 C)   Resp 16   SpO2 96%   Visual Acuity Right Eye Distance:   Left Eye Distance:   Bilateral Distance:    Right Eye Near:   Left Eye Near:    Bilateral Near:     Physical Exam Constitutional:      General: She is not in acute distress. HENT:     Right Ear: Tympanic membrane normal.     Left Ear: Tympanic membrane normal.     Nose: Nose normal.     Mouth/Throat:     Mouth: Mucous membranes are moist.     Pharynx: Posterior oropharyngeal erythema present.     Comments: PND  Cardiovascular:     Rate and Rhythm: Normal rate and regular rhythm.     Heart sounds: Normal heart sounds.  Pulmonary:     Effort: Pulmonary effort is normal. No respiratory distress.     Breath sounds: Normal breath sounds.   Neurological:     Mental Status: She is alert.      UC Treatments / Results  Labs (all labs ordered are listed, but only abnormal results are displayed) Labs Reviewed  POCT RAPID STREP A (OFFICE)  POC SARS CORONAVIRUS 2 AG -  ED    EKG   Radiology No results found.  Procedures Procedures (including critical care time)  Medications Ordered in UC Medications - No data to display  Initial Impression / Assessment and Plan / UC Course  I have reviewed the triage vital signs and the nursing notes.  Pertinent labs & imaging results that were available during my care of the patient were reviewed by me and considered in my medical decision making (see chart for details).    Viral illness, viral pharyngitis.  Rapid strep negative.   Rapid COVID-negative.  Patient has been symptomatic for 2 days.  No OTC medications taken.  Discussed symptomatic treatment including Tylenol or ibuprofen as needed, plain Mucinex as needed, rest, hydration.  Education provided on viral illness and viral pharyngitis.  Instructed patient to follow up with her PCP if her symptoms are not improving.  She agrees to plan of care.   Final Clinical Impressions(s) / UC Diagnoses   Final diagnoses:  Viral illness  Viral pharyngitis     Discharge Instructions      The COVID and strep tests are negative.   Take Tylenol or ibuprofen as needed for fever or discomfort.    Follow-up with your primary care provider if your symptoms are not improving.         ED Prescriptions   None    PDMP not reviewed this encounter.   Corlis Burnard DEL, NP 05/15/24 1331

## 2024-05-15 NOTE — ED Triage Notes (Addendum)
 Patient to Urgent Care with complaints of sore throat/ hoarseness/ feeling run down. Denies any known fevers.   Symptoms started Saturday.   Took dose of celebrex (no relief).

## 2024-07-05 DIAGNOSIS — Z96642 Presence of left artificial hip joint: Secondary | ICD-10-CM | POA: Diagnosis not present

## 2024-07-05 DIAGNOSIS — M7062 Trochanteric bursitis, left hip: Secondary | ICD-10-CM | POA: Diagnosis not present

## 2024-07-11 ENCOUNTER — Other Ambulatory Visit: Payer: Self-pay | Admitting: Family Medicine

## 2024-07-11 DIAGNOSIS — E782 Mixed hyperlipidemia: Secondary | ICD-10-CM

## 2024-07-11 DIAGNOSIS — I700 Atherosclerosis of aorta: Secondary | ICD-10-CM

## 2024-08-07 ENCOUNTER — Other Ambulatory Visit: Payer: Self-pay | Admitting: Family Medicine

## 2024-08-07 ENCOUNTER — Encounter: Payer: Self-pay | Admitting: Family Medicine

## 2024-08-17 DIAGNOSIS — E890 Postprocedural hypothyroidism: Secondary | ICD-10-CM | POA: Diagnosis not present

## 2024-08-17 NOTE — Telephone Encounter (Signed)
 Will route to PCP to see if it's okay to add repatha  back on med list

## 2024-08-20 NOTE — Telephone Encounter (Signed)
 Please add it back Heve her schedule a visit for the symptoms in her feet  Thanks

## 2024-08-21 DIAGNOSIS — E890 Postprocedural hypothyroidism: Secondary | ICD-10-CM | POA: Diagnosis not present

## 2024-08-21 DIAGNOSIS — Z1331 Encounter for screening for depression: Secondary | ICD-10-CM | POA: Diagnosis not present

## 2024-08-21 DIAGNOSIS — E782 Mixed hyperlipidemia: Secondary | ICD-10-CM | POA: Diagnosis not present

## 2024-08-23 NOTE — Telephone Encounter (Signed)
 I think once a month would only decrease the cholesterol for half the time-unsure if there is any data on that or if it would be worth it I am cc our pharmacist to see what she says Pt has side effects from it and wants to take it less   Thanks for help in advance

## 2024-08-26 NOTE — Telephone Encounter (Signed)
 Please schedule appointment with Manuelita, thanks

## 2024-08-31 ENCOUNTER — Telehealth: Payer: Self-pay | Admitting: Pharmacist

## 2024-08-31 ENCOUNTER — Other Ambulatory Visit: Payer: Self-pay | Admitting: Pharmacist

## 2024-08-31 DIAGNOSIS — E782 Mixed hyperlipidemia: Secondary | ICD-10-CM

## 2024-08-31 NOTE — Progress Notes (Signed)
 08/31/2024 Name: Angela Bell MRN: [id number] DOB: 06-Dec-1945  Subjective  Chief Complaint  Patient presents with   Medication Access   Reason for visit: ?  Angela Bell is a 78 y.o. female who presents today for a follow up hyperlipidemia/ASCVD risk-rediction pharmacotherapy visit.? Pertinent PMH includes Aortic atherosclerosis, FLD, GERD, Hypothyroid, Osteoporosis, Pre-diabetes, HLD.  Care Team: Primary Care Provider: Tower, Laine LABOR, MD  Medication Access/Adherence: Prescription drug coverage: Payor: [employer] / Plan: [employer] PPO / Product Type: *No Product type* / .  Reports that all medications are affordable.  Current Patient Assistance: None Medication Adherence: Patient denies missing doses of their medication.    History of Present Illness: ?  Reports bad diarrhea and flu-like symptoms including back ache in days following Repatha . Did trial off of Repatha  for a month and did feel better though has since resumed as her cholesterol numbers are important to her.   Reported Lipid Regimen: ? Repatha  140 mg q2wk   Lipid-lowering medications tried in the past:?  Atorvastatin  10 mg daily (Prescribed 01/13/23, could not tolerate due to persistent muscle pains. Atorvastatin  5 mg daily (Muscle pains persisted despite dose reduction) Atorvastatin  5 mg three days per week  Ezetimibe  (Prescribed 01/09/22 - muscle/joint pain in lower extremity, persistent despite reduction to 5 mg daily.) Rosuvastatin  5 mg daily (Prescribed 01/13/20 - Took for ~5 weeks though could not tolerate persistent muscle aches. Reduced dose to 5 mg daily though muscle aches persisted. Reduced dose to 3 times per week dosing. Dinally stopping taking and muscle symptoms resolved.)  Reported Diet: Has been watching her diet closely since she stopped taking cholesterol medication. Feels she has a good baseline understanding of foods to avoid to mitigate risk of increasing her cholesterol  levels.   Exercise: Less active this past year s/p hip replacement. Did go back to work right away.  Recovery has been going well, now feels significant improvement in pain/mobility.   Cardiovascular Risk Reduction History of clinical ASCVD? no The 10-year ASCVD risk score (Arnett DK, et al., 2019) is: 16.6% History of heart failure? no History of diabetes: No. Pre-Diabetes Current BMI: 24.2 kg/m2 (Ht 64 in, Wt 64 kg) Taking statin? intolerant; (Myalgia on lowest dose atorvastatin , rosuvastatin , ezetimibe ) Taking aspirin? not indicated; Not taking   Taking SGLT-2i? no Taking GLP- 1 RA? no     _______________________________________________  Objective    Review of Systems:? Limited in the setting of virtual visit    Physical Examination:  Vitals:  Wt Readings from Last 3 Encounters:  01/17/24 141 lb 2 oz (64 kg)  11/30/23 146 lb 2 oz (66.3 kg)  07/06/23 146 lb 2 oz (66.3 kg)   BP Readings from Last 3 Encounters:  05/15/24 116/77  01/17/24 112/68  11/30/23 122/70   Pulse Readings from Last 3 Encounters:  05/15/24 88  01/17/24 98  11/30/23 72     Labs:?  Lab Results  Component Value Date   CKTOTAL 42 07/06/2023   Coronary Calcium  Score (03/31/22): 25.5 1. This was 47th percentile for age and sex matched control   Lab Results  Component Value Date   CHOL 131 05/05/2024   LDLCALC 50 05/05/2024   LDLCALC 164 (H) 01/10/2024   LDLCALC 88 04/30/2023   LDLDIRECT 146.3 08/22/2013   LDLDIRECT 128.6 04/19/2012   HDL 63 05/05/2024   TRIG 97 05/05/2024   TRIG 174.0 (H) 01/10/2024   TRIG 107.0 04/30/2023   ALT 13 05/05/2024   ALT 13 01/10/2024  AST 16 05/05/2024   AST 16 01/10/2024   Lab Results  Component Value Date   HGBA1C 5.8 01/10/2024   HGBA1C 5.8 01/05/2023   HGBA1C 5.6 01/07/2022   GLUCOSE 85 05/05/2024   CREATININE 0.78 05/05/2024   CREATININE 0.77 03/17/2024   CREATININE 0.73 01/10/2024   GFR 75.38 03/17/2024   GFR 80.46 01/10/2024   GFR 71.17  09/22/2023     Chemistry      Component Value Date/Time   NA 141 05/05/2024 0759   K 3.7 05/05/2024 0759   CL 106 05/05/2024 0759   CO2 25 05/05/2024 0759   BUN 15 05/05/2024 0759   CREATININE 0.78 05/05/2024 0759      Component Value Date/Time   CALCIUM  8.8 05/05/2024 0759   ALKPHOS 100 01/10/2024 0740   AST 16 05/05/2024 0759   ALT 13 05/05/2024 0759   BILITOT 0.8 05/05/2024 0759     The 10-year ASCVD risk score (Arnett DK, et al., 2019) is: 16.6%  Assessment and Plan:    #Hyperlipidemia (primary prevention): Improved on last lipid panel (05/05/24): TC 253>131 mg/dL, LDL 835>49 mg/dL, TG 825>02 mg/dL after starting Repatha . LDL goal <100 mg/dL (primary prevention, no diabetes, low risk). Has been taking Repatha  for 6 months with great results, though continues to struggle with adverse effects which have not resolved over time despite good compliance. Side effects resolved with a 39-month break.  - Previous therapies: rosuvastatin , atorvastatin , ezetimibe  (intolerance despite lowest doses and despite less than once-daily dosing).  - Key risk factors include: hyperlipidemia, aortic atherosclerosis noted on CT, CAC <100. - Fam Hx: Grandmother and mother (stroke hx, CAD - though lived to 38s).  - The 10-year ASCVD risk score (Arnett DK, et al., 2019) is: 16.6% indicated patient is at moderate risk.  Considerations:  Alternative PCSK9i, Praluent PCSK9i remains strong recommendation for this patient given evidence of significant LDL reduction and per 15% reduction in MACE outcomes per FOURIER/ODYSSEY trials. Leqvio Great option per efficacy and low incidence of adverse effects. Q60mo injection. Cost unclear, would need to submit to [facility] Prolia  team for benefit investigation for Leqvio cost. Patient states prolia  cost currently ~$200 which she feels would be worth it for a cholesterol medication. Income exceeds PAP cutoffs.   Patient preference to try Leqvio if covered. Therapy plan  entered which will trigger benefit investigation from [facility] pharmacy team.  Leqvio start form filled out and uploaded to PCP eFax for signature. CMA msgd as fyi. Once signed, can go in patient folders in front office for patient signature.   Follow Up Patient given direct line for questions regarding medication therapy   Future Appointments  Date Time Provider Department Center  12/12/2024  4:00 PM Claudene Lehmann, MD [facility] None  01/18/2025  8:10 AM [facility] ANNUAL WELLNESS VISIT 1 [facility] [address]   Angela Bell, PharmD Clinical Pharmacist [facility] [phone]

## 2024-09-01 ENCOUNTER — Telehealth: Payer: Self-pay

## 2024-09-01 NOTE — Telephone Encounter (Signed)
 Auth Submission: NO AUTH NEEDED Site of care: Site of care: [facility] Payer: hta ppo Medication & CPT/J Code(s) submitted: Leqvio (Inclisiran) J1306 Diagnosis Code:  Route of submission (phone, fax, portal): phone Phone # Fax # Auth type: Buy/Bill PB Units/visits requested: 284mg  x 3 doses Reference number: [id number] Approval from: 09/01/24 to 11/22/24

## 2024-09-01 NOTE — Telephone Encounter (Signed)
Form printed and placed in PCP's inbox.

## 2024-09-04 ENCOUNTER — Other Ambulatory Visit (HOSPITAL_COMMUNITY): Payer: Self-pay | Admitting: Family Medicine

## 2024-09-04 ENCOUNTER — Telehealth: Payer: Self-pay

## 2024-09-04 ENCOUNTER — Telehealth (HOSPITAL_COMMUNITY): Payer: Self-pay

## 2024-09-04 NOTE — Telephone Encounter (Signed)
 Prolia  VOB initiated via [email or web address]  Next Prolia  inj DUE: 10/05/24

## 2024-09-04 NOTE — Telephone Encounter (Signed)
 Auth Submission: NO AUTH NEEDED Site of care: Site of care: [facility] Payer: [employer]age Medication & CPT/J Code(s) submitted: Leqvio (Inclisiran) J1306 Diagnosis Code:  Route of submission (phone, fax, portal):  Phone # Fax # Auth type: Buy/Bill HB Units/visits requested: 284mg  q33months x 2 doses, then 284mg  q6months Reference number:  Approval from: 09/04/24 to 11/22/24

## 2024-09-05 ENCOUNTER — Other Ambulatory Visit (HOSPITAL_COMMUNITY): Payer: Self-pay

## 2024-09-05 NOTE — Telephone Encounter (Signed)
 Pt ready for scheduling for PROLIA  on or after : 10/05/24  Option# 1: Buy/Bill (Office supplied medication)  Out-of-pocket cost due at time of clinic visit: $332  Number of injection/visits approved: ---  Primary: [employer] Prolia  co-insurance: 20% Admin fee co-insurance: 0%  Secondary: --- Prolia  co-insurance:  Admin fee co-insurance:   Medical Benefit Details: Date Benefits were checked: 09/04/24 Deductible: NO/ Coinsurance: 20%/ Admin Fee: 0%  Prior Auth: N/A PA# Expiration Date:   # of doses approved: ----------------------------------------------------------------------- Option# 2- Med Obtained from pharmacy: Prolia  is no longer preferred for pharmacy benefit. Jubbonti is now preferred. PRICING IS FOR JUBBONTI  Pharmacy benefit: Copay $0 (Paid to pharmacy) Admin Fee: 0% (Pay at clinic)  Prior Auth: N/A PA# Expiration Date:   # of doses approved:   If patient wants fill through the pharmacy benefit please send prescription to: [facility], and include estimated need by date in rx notes. Pharmacy will ship medication directly to the office.  Patient NOT eligible for Prolia  Copay Card. Copay Card can make patient's cost as little as $25. Link to apply: https://www.amgensupportplus.com/copay  ** This summary of benefits is an estimation of the patient's out-of-pocket cost. Exact cost may very based on individual plan coverage.

## 2024-09-05 NOTE — Telephone Encounter (Signed)
 SABRA

## 2024-09-12 ENCOUNTER — Encounter: Payer: Self-pay | Admitting: Pharmacist

## 2024-09-12 ENCOUNTER — Telehealth: Payer: Self-pay | Admitting: *Deleted

## 2024-09-12 NOTE — Telephone Encounter (Signed)
 Pt came in front desk is getting pt to sign form now

## 2024-09-12 NOTE — Telephone Encounter (Signed)
 Copied from CRM #[id number]. Topic: Clinical - Medical Advice >> Sep 12, 2024 10:03 AM Charolett CROME wrote: Reason for CRM: [facility] pharmaceutical enrollment form stating that they are missing patient auth and insurance information. Patient has to be contacted and  would have to come in and sign form as well in order for enrollment to go through Fax# [phone]

## 2024-09-12 NOTE — Progress Notes (Signed)
 Brief Telephone Documentation Reason for Call: Patient left message regarding question for pharmacist regarding Leqvio start form   Summary of Call: Patient received call from Leqvio reporting incomplete information  Follow Up: Leqvio start form already completed, unclear if PCP has signed or now.  Form uploaded to front office eFax folder for patient signature this week at the front desk  Next Steps: Once patient signs form at front office, place in [employer] folder for PCP signature Then, can be faxed to [facility] # on the form   Manuelita FABIENE Kobs, PharmD Clinical Pharmacist [facility] [phone]

## 2024-09-13 NOTE — Telephone Encounter (Signed)
 Form refaxed and Manuelita aware

## 2024-09-15 ENCOUNTER — Telehealth: Payer: Self-pay

## 2024-09-15 NOTE — Telephone Encounter (Signed)
 Copied from CRM #[id number]. Topic: Clinical - Medical Advice >> Sep 12, 2024 10:03 AM Angela Bell wrote: Reason for CRM: Angela Bell service center pharmaceutical enrollment form stating that they are missing patient auth and insurance information. Patient has to be contacted and  would have to come in and sign form as well in order for enrollment to go through Fax# [phone] >> Sep 15, 2024  3:21 PM Angela Bell wrote: Angela Bell called in regarding status on the form, would like that to be faxed   [phone]  Any questions [phone]

## 2024-09-15 NOTE — Telephone Encounter (Signed)
 For was faxed with insurance card after Angela Bell reviewed it, I will send this to Angela Bell to see if we're missing something else they are needing

## 2024-09-15 NOTE — Telephone Encounter (Signed)
 Copied from CRM #[id number]. Topic: Clinical - Medical Advice >> Sep 12, 2024 10:03 AM Charolett CROME wrote: Reason for CRM: [facility] pharmaceutical enrollment form stating that they are missing patient auth and insurance information. Patient has to be contacted and  would have to come in and sign form as well in order for enrollment to go through Fax# [phone] >> Sep 15, 2024  3:21 PM Thersia C wrote: Gailen called in regarding status on the form, would like that to be faxed   [phone]  Any questions [phone]

## 2024-09-19 ENCOUNTER — Encounter: Payer: Self-pay | Admitting: Pharmacist

## 2024-09-19 ENCOUNTER — Other Ambulatory Visit: Payer: Self-pay

## 2024-09-19 NOTE — Progress Notes (Signed)
 Chart Review Reason: [facility] Start Form  Summary: [facility] per message left with CMA regarding missing information.  Start form including patient signature and insurance card were faxed on 10/24 though Leqvio denies receiving this.   Forwarded to CMA as FYI = fax not received. Re-send required

## 2024-09-21 ENCOUNTER — Telehealth: Payer: Self-pay | Admitting: Family Medicine

## 2024-09-21 ENCOUNTER — Encounter (HOSPITAL_COMMUNITY)
Admission: RE | Admit: 2024-09-21 | Discharge: 2024-09-21 | Disposition: A | Discharge status: Home / Self Care | Source: Ambulatory Visit | Attending: Family Medicine | Admitting: Family Medicine

## 2024-09-21 VITALS — BP 118/82 | HR 82 | Temp 97.9°F | Resp 16

## 2024-09-21 DIAGNOSIS — E782 Mixed hyperlipidemia: Secondary | ICD-10-CM | POA: Diagnosis not present

## 2024-09-21 MED ORDER — INCLISIRAN SODIUM 284 MG/1.5ML ~~LOC~~ SOSY
PREFILLED_SYRINGE | SUBCUTANEOUS | Status: AC
  Filled 2024-09-21: qty 1.5

## 2024-09-21 MED ORDER — INCLISIRAN SODIUM 284 MG/1.5ML ~~LOC~~ SOSY
284.0000 mg | PREFILLED_SYRINGE | Freq: Once | SUBCUTANEOUS | Status: AC
  Administered 2024-09-21: 284 mg via SUBCUTANEOUS

## 2024-09-21 NOTE — Telephone Encounter (Signed)
 Copied from CRM [id number]. Topic: General - Other >> Sep 20, 2024  2:57 PM Roselie BROCKS wrote: Reason for CRM: Gailen from Leqvio called and needing insurance pre authorization on patient, and requests a  call back at    [phone]  And fax number is [phone]

## 2024-09-21 NOTE — Telephone Encounter (Unsigned)
 Copied from CRM #[id number]. Topic: Clinical - Medical Advice >> Sep 21, 2024 12:27 PM Shereese L wrote: Reason for CRM:  [facility] service center pharmaceutical enrollment form stating that they are missing patient auth and insurance information. Fax# [phone]  Any questions [phone]

## 2024-09-22 NOTE — Telephone Encounter (Signed)
 Form and insurance card refaxed

## 2024-09-22 NOTE — Telephone Encounter (Signed)
 Was waiting for the form to be scanned into chart. Reprinted and refaxed form along with insurance card to fax # on form (3rd time faxing form)  FYI to [place]

## 2024-09-26 ENCOUNTER — Other Ambulatory Visit: Payer: Self-pay

## 2024-09-26 ENCOUNTER — Telehealth: Payer: Self-pay | Admitting: *Deleted

## 2024-09-26 ENCOUNTER — Encounter: Payer: Self-pay | Admitting: Pharmacist

## 2024-09-26 ENCOUNTER — Other Ambulatory Visit: Payer: Self-pay | Admitting: *Deleted

## 2024-09-26 DIAGNOSIS — M810 Age-related osteoporosis without current pathological fracture: Secondary | ICD-10-CM

## 2024-09-26 MED ORDER — DENOSUMAB-BBDZ 60 MG/ML ~~LOC~~ SOSY
60.0000 mg | PREFILLED_SYRINGE | SUBCUTANEOUS | 0 refills | Status: AC
  Filled 2024-09-26 (×2): qty 1, 180d supply, fill #0

## 2024-09-26 NOTE — Telephone Encounter (Signed)
 See referral

## 2024-09-26 NOTE — Progress Notes (Signed)
 Specialty Pharmacy Refill Coordination Note  Angela Bell is a 78 y.o. female contacted today regarding refills of specialty medication(s) Denosumab -bbdz BARTON)   Patient requested Courier to Provider Office   Delivery date: 10/05/24   Verified address: [facility]   Medication will be filled on: 10/04/24

## 2024-09-26 NOTE — Progress Notes (Signed)
 Chart Review Reason: Medication Access  Summary: Called Leqvio program to ensure start form and insurance card received.  They confirm receipt of both documents on 10/31. Reviewed form with representative, confirmed all required information is been received/updated.

## 2024-09-26 NOTE — Telephone Encounter (Signed)
 Copied from CRM [id number]. Topic: Clinical - Request for Lab/Test Order >> Sep 26, 2024  1:36 PM Angela Bell wrote: Reason for CRM: Patient called to schedule lab appointment to set up her next prolia  shot; however, no active orders.

## 2024-10-03 ENCOUNTER — Other Ambulatory Visit: Payer: Self-pay | Admitting: Medical Genetics

## 2024-10-04 ENCOUNTER — Other Ambulatory Visit (HOSPITAL_COMMUNITY): Payer: Self-pay

## 2024-10-04 ENCOUNTER — Telehealth: Payer: Self-pay

## 2024-10-04 ENCOUNTER — Other Ambulatory Visit: Payer: Self-pay

## 2024-10-04 NOTE — Telephone Encounter (Signed)
 Pharmacy Patient Advocate Encounter   Received notification from Onbase that prior authorization for Repatha  Sureclick 140 is required/requested.   Insurance verification completed.   The patient is insured through [facility] ADVANTAGE/RX ADVANCE.   Per patient chart: Please advise

## 2024-10-05 ENCOUNTER — Encounter: Payer: Self-pay | Admitting: Family Medicine

## 2024-10-05 ENCOUNTER — Other Ambulatory Visit

## 2024-10-05 ENCOUNTER — Ambulatory Visit: Payer: Self-pay | Admitting: Family Medicine

## 2024-10-05 DIAGNOSIS — M810 Age-related osteoporosis without current pathological fracture: Secondary | ICD-10-CM | POA: Diagnosis not present

## 2024-10-05 LAB — BASIC METABOLIC PANEL WITH GFR
BUN: 12 mg/dL (ref 6–23)
CO2: 26 meq/L (ref 19–32)
Calcium: 8.7 mg/dL (ref 8.4–10.5)
Chloride: 105 meq/L (ref 96–112)
Creatinine, Ser: 0.75 mg/dL (ref 0.40–1.20)
GFR: 77.49 mL/min (ref >60.00)
Glucose, Bld: 103 mg/dL — ABNORMAL HIGH (ref 70–99)
Potassium: 3.9 meq/L (ref 3.5–5.1)
Sodium: 139 meq/L (ref 135–145)

## 2024-10-06 ENCOUNTER — Other Ambulatory Visit
Admission: RE | Admit: 2024-10-06 | Discharge: 2024-10-06 | Disposition: A | Discharge status: Home / Self Care | Payer: Self-pay | Source: Ambulatory Visit | Attending: Medical Genetics | Admitting: Medical Genetics

## 2024-10-10 ENCOUNTER — Ambulatory Visit

## 2024-10-10 DIAGNOSIS — M810 Age-related osteoporosis without current pathological fracture: Secondary | ICD-10-CM | POA: Diagnosis not present

## 2024-10-10 MED ORDER — DENOSUMAB-BBDZ 60 MG/ML ~~LOC~~ SOSY
60.0000 mg | PREFILLED_SYRINGE | SUBCUTANEOUS | Status: AC
  Administered 2024-10-10: 60 mg via SUBCUTANEOUS

## 2024-10-10 MED ORDER — DENOSUMAB-BBDZ 60 MG/ML ~~LOC~~ SOSY: 60.0000 mg | PREFILLED_SYRINGE | SUBCUTANEOUS | Status: AC

## 2024-10-10 NOTE — Progress Notes (Signed)
 Per orders of Dr. Laine Balls, injection of Jubbonti  given by Nellie Hummer in left arm. Patient tolerated injection well. Patient will make appointment for 6 months.

## 2024-10-11 ENCOUNTER — Encounter: Payer: Self-pay | Admitting: Pharmacist

## 2024-10-11 ENCOUNTER — Other Ambulatory Visit: Payer: Self-pay | Admitting: Pharmacist

## 2024-10-11 NOTE — Progress Notes (Signed)
 Brief Telephone Documentation Reason for Call: Follow up on new Leqvio start  Summary of Call: Called patient to inform her that her [employer] grant for Repatha  should cover the cost of Leqvio at the infusion center.  First Leqvio injection was received 10/30. Patient denies any concerns with the injection or in the period following her injection. No side effects or other concerns.   She asks if she can receive future doses in [place].    HLD.  Well-controlled on PSCK9i though was discontinued due to ongoing side effects. Transitioned to Leqvio as of 09/21/24 without concerns. No need for repeat lipid panel outside of regular PCP visit though reasonable if patient preference at any time given change in therapy. Expect equivalent, if not greater, LDL-reduction compared to ERDX0p.  Messaged infusion folks who confirm the location can be changed to [facility] infusion center for her next injection in Feb.  [employer] information and approval letter provided to Infusion Center team Fwd to PCP as FYI    Follow Up: Patient given direct line for further questions/concerns. Epic reminder set 12/26/24 for [employer]. Expires 12/25/24  Manuelita FABIENE Kobs, PharmD Clinical Pharmacist [facility] [phone]

## 2024-10-11 NOTE — Progress Notes (Signed)
 Med list updated, Repatha  removed. Not taken in the past 2+ months. Now on Leqvio, added to med list.

## 2024-10-23 LAB — GENECONNECT MOLECULAR SCREEN: Genetic Analysis Overall Interpretation: NEGATIVE

## 2024-11-01 ENCOUNTER — Encounter: Payer: Self-pay | Admitting: Pharmacist

## 2024-11-01 ENCOUNTER — Encounter: Payer: Self-pay | Admitting: Family Medicine

## 2024-11-01 NOTE — Progress Notes (Signed)
 Chart Review Reason: Drug information Question - Leqvio /Infusion Center billing  Summary: Patient notes she received a bill in the mail for Leqvio  injection at infusion center previously.   Our office had previously communicated with infusion center, providing them with [employer]   Considerations: Message sent to infusion center team regarding clarification on billing of most recent Leqvio  injection.   ____________________ Update: 12/10: Infusion team confirms Med Assistance team has processed the claim through insurance + [employer]. Patient may disregard the bill. Patient has been updated via mychart.    Manuelita FABIENE Kobs, PharmD Clinical Pharmacist [facility] [phone]

## 2024-12-01 ENCOUNTER — Telehealth (HOSPITAL_COMMUNITY): Payer: Self-pay

## 2024-12-01 NOTE — Telephone Encounter (Signed)
 Auth Submission: NO AUTH NEEDED Site of care: Site of care: [facility] Payer: [employer] Medication & CPT/J Code(s) submitted: Leqvio  (Inclisiran) J1306 Diagnosis Code: E78.2 Route of submission (phone, fax, portal):  Phone # Fax # Auth type: Buy/Bill HB Units/visits requested: 284mg  q23months Reference number:  Approval from: 12/01/24 to 11/22/25

## 2024-12-05 ENCOUNTER — Other Ambulatory Visit (HOSPITAL_COMMUNITY): Payer: Self-pay | Admitting: Family Medicine

## 2024-12-12 ENCOUNTER — Encounter: Payer: Self-pay | Admitting: Dermatology

## 2024-12-12 ENCOUNTER — Ambulatory Visit: Payer: PPO | Admitting: Dermatology

## 2024-12-12 DIAGNOSIS — W908XXA Exposure to other nonionizing radiation, initial encounter: Secondary | ICD-10-CM

## 2024-12-12 DIAGNOSIS — Z1283 Encounter for screening for malignant neoplasm of skin: Secondary | ICD-10-CM | POA: Diagnosis not present

## 2024-12-12 DIAGNOSIS — B079 Viral wart, unspecified: Secondary | ICD-10-CM | POA: Diagnosis not present

## 2024-12-12 DIAGNOSIS — L578 Other skin changes due to chronic exposure to nonionizing radiation: Secondary | ICD-10-CM | POA: Diagnosis not present

## 2024-12-12 DIAGNOSIS — L814 Other melanin hyperpigmentation: Secondary | ICD-10-CM

## 2024-12-12 DIAGNOSIS — L570 Actinic keratosis: Secondary | ICD-10-CM

## 2024-12-12 DIAGNOSIS — L821 Other seborrheic keratosis: Secondary | ICD-10-CM

## 2024-12-12 DIAGNOSIS — D1801 Hemangioma of skin and subcutaneous tissue: Secondary | ICD-10-CM | POA: Diagnosis not present

## 2024-12-12 DIAGNOSIS — B009 Herpesviral infection, unspecified: Secondary | ICD-10-CM

## 2024-12-12 DIAGNOSIS — L719 Rosacea, unspecified: Secondary | ICD-10-CM

## 2024-12-12 DIAGNOSIS — D229 Melanocytic nevi, unspecified: Secondary | ICD-10-CM

## 2024-12-12 MED ORDER — VALACYCLOVIR HCL 500 MG PO TABS: ORAL_TABLET | ORAL | 11 refills | Status: AC

## 2024-12-12 NOTE — Progress Notes (Signed)
 "  Follow-Up Visit   Subjective  Angela Bell is a 79 y.o. female who presents for the following: Skin Cancer Screening and Full Body Skin Exam Patient states LOC on right temple that feels raw and itchy present for about 6 months and right index finger that appears hard and crusty, she thought it was a wart and picked at it, has been present for a month. She would like a refill for Valtrex .   The patient presents for Total-Body Skin Exam (TBSE) for skin cancer screening and mole check. The patient has spots, moles and lesions to be evaluated, some may be new or changing and the patient may have concern these could be cancer.   The following portions of the chart were reviewed this encounter and updated as appropriate: medications, allergies, medical history  Review of Systems:  No other skin or systemic complaints except as noted in HPI or Assessment and Plan.  Objective  Well appearing patient in no apparent distress; mood and affect are within normal limits.  A full examination was performed including scalp, head, eyes, ears, nose, lips, neck, chest, axillae, abdomen, back, buttocks, bilateral upper extremities, bilateral lower extremities, hands, feet, fingers, toes, fingernails, and toenails. All findings within normal limits unless otherwise noted below.   Relevant physical exam findings are noted in the Assessment and Plan.  Right lateral brow x1 Erythematous thin papules/macules with gritty scale.  Assessment & Plan   SKIN CANCER SCREENING PERFORMED TODAY.  ACTINIC DAMAGE - chronic, secondary to cumulative UV radiation exposure/sun exposure over time - diffuse scaly erythematous macules with underlying dyspigmentation - Recommend daily broad spectrum sunscreen SPF 30+ to sun-exposed areas, reapply every 2 hours as needed.  - Recommend staying in the shade or wearing long sleeves, sun glasses (UVA+UVB protection) and wide brim hats (4-inch brim around the entire  circumference of the hat). - Call for new or changing lesions.  LENTIGINES, SEBORRHEIC KERATOSES, HEMANGIOMAS - Benign normal skin lesions - Benign-appearing - Call for any changes  SEBORRHEIC KERATOSIS -Pink scaly excoriated papule at right lower medial leg - Benign-appearing - Discussed benign etiology and prognosis. - Observe - Call for any changes - Patient advised RTC if not resolved in 1 month.  MELANOCYTIC NEVI - Tan-brown and/or pink-flesh-colored symmetric macules and papules - Benign appearing on exam today - Observation - Call clinic for new or changing moles - Recommend daily use of broad spectrum spf 30+ sunscreen to sun-exposed areas.   ROSACEA Exam Mid face erythema with telangiectasias inflammatory papule at nasal tip  Chronic condition with duration or expected duration over one year. Currently well-controlled.   Rosacea is a chronic progressive skin condition usually affecting the face of adults, causing redness and/or acne bumps. It is treatable but not curable. It sometimes affects the eyes (ocular rosacea) as well. It may respond to topical and/or systemic medication and can flare with stress, sun exposure, alcohol, exercise, topical steroids (including hydrocortisone /cortisone 10) and some foods.  Daily application of broad spectrum spf 30+ sunscreen to face is recommended to reduce flares.   Treatment Plan Resolving.   HERPESVIRAL INFECTION (COLD SORES) Exam Clear today  Chronic and persistent condition with duration or expected duration over one year. Condition is symptomatic / bothersome to patient. Not to goal.  Herpes Simplex Virus = Cold Sores = Fever Blisters is a chronic recurring blistering; scabbing sore-producing viral infection that is recurrent usually in the same area triggered by stress, sun/UV exposure and trauma.  It is infectious  and can be spread from person to person by direct contact.  It is not curable, but is treatable with topical  and oral medication.  Treatment Plan Take Valacyclovir  2 grams every 12 hours for 2 doses with a glass of water at the first sign of symptoms. Patient advised may take prior to taking flu shot to prevent flares. May flare with vaccination due to immune redirection AK (ACTINIC KERATOSIS) Right lateral brow x1 Actinic keratoses are precancerous spots that appear secondary to cumulative UV radiation exposure/sun exposure over time. They are chronic with expected duration over 1 year. A portion of actinic keratoses will progress to squamous cell carcinoma of the skin. It is not possible to reliably predict which spots will progress to skin cancer and so treatment is recommended to prevent development of skin cancer.  Recommend daily broad spectrum sunscreen SPF 30+ to sun-exposed areas, reapply every 2 hours as needed.  Recommend staying in the shade or wearing long sleeves, sun glasses (UVA+UVB protection) and wide brim hats (4-inch brim around the entire circumference of the hat). Call for new or changing lesions.  Patient advised to RTC if not resolved in 1 month and can consider 5FU.  - Destruction of lesion - Right lateral brow x1 Complexity: simple   Destruction method: cryotherapy   Informed consent: discussed and consent obtained   Timeout:  patient name, date of birth, surgical site, and procedure verified Lesion destroyed using liquid nitrogen: Yes   Region frozen until ice ball extended beyond lesion: Yes   Cryo cycles: 1 or 2. Outcome: patient tolerated procedure well with no complications   Post-procedure details: wound care instructions given   Additional details:  Prior to procedure, discussed risks of blister formation, small wound, skin dyspigmentation, or rare scar following cryotherapy. Recommend Vaseline ointment to treated areas while healing.   VIRAL WARTS, UNSPECIFIED TYPE Right medial second finger verruca x1 - Destruction of lesion - Right medial second finger  verruca x1 Complexity: simple   Destruction method: cryotherapy   Informed consent: discussed and consent obtained   Timeout:  patient name, date of birth, surgical site, and procedure verified Lesion destroyed using liquid nitrogen: Yes   Region frozen until ice ball extended beyond lesion: Yes   Cryo cycles: 1 or 2. Outcome: patient tolerated procedure well with no complications   Post-procedure details: wound care instructions given   Additional details:  Prior to procedure, discussed risks of blister formation, small wound, skin dyspigmentation, or rare scar following cryotherapy. Recommend Vaseline ointment to treated areas while healing.   HERPES SIMPLEX   This Visit - valACYclovir  (VALTREX ) 500 MG tablet - START VALYCYCLOVIR 500MG  TWICE A DAY 24 HOURS PRIOR TO PROCEDURE AND CONTINUE TWICE DAILY X 5 DAYS TOTAL. FOR OUTBREAKS, TAKE 4 BY MOUTH AT ONSET OF SYMPTOMS AND 4 PO 12 HOURS LATER. MULTIPLE BENIGN NEVI   LENTIGINES   ACTINIC ELASTOSIS   SEBORRHEIC KERATOSES   CHERRY ANGIOMA   Return in about 1 year (around 12/12/2025) for TBSE, w/ Dr. Claudene.  IAlmetta Nora, RMA, am acting as scribe for Boneta Claudene, MD .   Documentation: I have reviewed the above documentation for accuracy and completeness, and I agree with the above.  Boneta Claudene, MD  "

## 2024-12-12 NOTE — Patient Instructions (Addendum)

## 2024-12-25 ENCOUNTER — Ambulatory Visit: Admission: RE | Admit: 2024-12-25 | Source: Ambulatory Visit

## 2024-12-25 ENCOUNTER — Inpatient Hospital Stay (HOSPITAL_COMMUNITY): Admission: RE | Admit: 2024-12-25 | Source: Ambulatory Visit

## 2024-12-26 ENCOUNTER — Encounter: Payer: Self-pay | Admitting: Pharmacist

## 2024-12-26 NOTE — Progress Notes (Signed)
 [employer] - Re-enrollment   Medication(s): All cholesterol medications (Leqvio )   Currently Enrolled: Expired 12/25/24; Eligible for re-enrollment    Application Status:  Approved for re-enrollment    [employer] ID: [id number] Fund: Hypercholesterolemia - Medicare Access Assistance Type: Co-pay Start Date: 12/26/2024 End Date: 12/25/2025               Rx Card: Card No.  [id number] RX BIN:  [id number] PCN:  [id number] Group:  [id number]   Forwarded to [facility]/Pharmacy Tech   Manuelita FABIENE Kobs, PharmD Clinical Pharmacist [facility] [phone]

## 2024-12-27 ENCOUNTER — Encounter: Payer: Self-pay | Admitting: Family Medicine

## 2024-12-27 ENCOUNTER — Ambulatory Visit
Admission: RE | Admit: 2024-12-27 | Discharge: 2024-12-27 | Disposition: A | Source: Ambulatory Visit | Attending: Family Medicine

## 2024-12-27 VITALS — BP 139/76 | HR 79 | Temp 98.2°F | Resp 18

## 2024-12-27 DIAGNOSIS — E782 Mixed hyperlipidemia: Secondary | ICD-10-CM

## 2024-12-27 MED ORDER — INCLISIRAN SODIUM 284 MG/1.5ML ~~LOC~~ SOSY
284.0000 mg | PREFILLED_SYRINGE | Freq: Once | SUBCUTANEOUS | Status: AC
  Administered 2024-12-27: 284 mg via SUBCUTANEOUS
  Filled 2024-12-27: qty 1.5

## 2024-12-28 ENCOUNTER — Inpatient Hospital Stay: Admission: RE | Admit: 2024-12-28 | Source: Ambulatory Visit

## 2025-01-18 ENCOUNTER — Ambulatory Visit

## 2025-01-19 ENCOUNTER — Ambulatory Visit

## 2025-06-26 ENCOUNTER — Ambulatory Visit

## 2025-12-18 ENCOUNTER — Ambulatory Visit: Admitting: Dermatology
